# Patient Record
Sex: Male | Born: 1945 | Race: White | Hispanic: No | Marital: Married | Smoking: Former smoker
Health system: Southern US, Community
[De-identification: ages and names within clinical notes are randomized; demographics above are authoritative.]

## PROBLEM LIST (undated history)

## (undated) DIAGNOSIS — N210 Calculus in bladder: Secondary | ICD-10-CM

## (undated) DIAGNOSIS — Z860100 Personal history of colon polyps, unspecified: Secondary | ICD-10-CM

## (undated) DIAGNOSIS — F32A Depression, unspecified: Secondary | ICD-10-CM

## (undated) DIAGNOSIS — Z8719 Personal history of other diseases of the digestive system: Secondary | ICD-10-CM

## (undated) DIAGNOSIS — Z9889 Other specified postprocedural states: Secondary | ICD-10-CM

## (undated) DIAGNOSIS — K5730 Diverticulosis of large intestine without perforation or abscess without bleeding: Secondary | ICD-10-CM

## (undated) DIAGNOSIS — I10 Essential (primary) hypertension: Secondary | ICD-10-CM

## (undated) DIAGNOSIS — K2970 Gastritis, unspecified, without bleeding: Secondary | ICD-10-CM

## (undated) DIAGNOSIS — M1990 Unspecified osteoarthritis, unspecified site: Secondary | ICD-10-CM

## (undated) DIAGNOSIS — K219 Gastro-esophageal reflux disease without esophagitis: Secondary | ICD-10-CM

## (undated) DIAGNOSIS — F419 Anxiety disorder, unspecified: Secondary | ICD-10-CM

## (undated) DIAGNOSIS — Z973 Presence of spectacles and contact lenses: Secondary | ICD-10-CM

## (undated) DIAGNOSIS — G2581 Restless legs syndrome: Secondary | ICD-10-CM

## (undated) DIAGNOSIS — M810 Age-related osteoporosis without current pathological fracture: Secondary | ICD-10-CM

## (undated) DIAGNOSIS — F329 Major depressive disorder, single episode, unspecified: Secondary | ICD-10-CM

## (undated) DIAGNOSIS — D509 Iron deficiency anemia, unspecified: Secondary | ICD-10-CM

## (undated) DIAGNOSIS — B9681 Helicobacter pylori [H. pylori] as the cause of diseases classified elsewhere: Secondary | ICD-10-CM

## (undated) DIAGNOSIS — N201 Calculus of ureter: Secondary | ICD-10-CM

## (undated) HISTORY — DX: Major depressive disorder, single episode, unspecified: F32.9

## (undated) HISTORY — DX: Iron deficiency anemia, unspecified: D50.9

## (undated) HISTORY — PX: EXTRACORPOREAL SHOCK WAVE LITHOTRIPSY: SHX1557

## (undated) HISTORY — DX: Essential (primary) hypertension: I10

## (undated) HISTORY — DX: Depression, unspecified: F32.A

---

## 1993-09-27 HISTORY — PX: OTHER SURGICAL HISTORY: SHX169

## 1998-09-27 HISTORY — PX: SHOULDER ARTHROSCOPY W/ SUBACROMIAL DECOMPRESSION AND DISTAL CLAVICLE EXCISION: SHX2401

## 2010-12-07 ENCOUNTER — Ambulatory Visit (INDEPENDENT_AMBULATORY_CARE_PROVIDER_SITE_OTHER): Payer: Medicare Other | Admitting: Internal Medicine

## 2010-12-07 DIAGNOSIS — R1310 Dysphagia, unspecified: Secondary | ICD-10-CM

## 2010-12-08 ENCOUNTER — Emergency Department (HOSPITAL_COMMUNITY)
Admission: EM | Admit: 2010-12-08 | Discharge: 2010-12-08 | Disposition: A | Discharge status: Home / Self Care | Payer: Medicare Other | Attending: Emergency Medicine | Admitting: Emergency Medicine

## 2010-12-08 DIAGNOSIS — K219 Gastro-esophageal reflux disease without esophagitis: Secondary | ICD-10-CM | POA: Insufficient documentation

## 2010-12-08 DIAGNOSIS — R000 Tachycardia, unspecified: Secondary | ICD-10-CM | POA: Insufficient documentation

## 2010-12-08 DIAGNOSIS — F411 Generalized anxiety disorder: Secondary | ICD-10-CM | POA: Insufficient documentation

## 2010-12-08 DIAGNOSIS — R002 Palpitations: Secondary | ICD-10-CM | POA: Insufficient documentation

## 2010-12-08 DIAGNOSIS — I10 Essential (primary) hypertension: Secondary | ICD-10-CM | POA: Insufficient documentation

## 2010-12-08 LAB — DIFFERENTIAL
Basophils Absolute: 0 10*3/uL (ref 0.0–0.1)
Eosinophils Relative: 2 % (ref 0–5)
Lymphocytes Relative: 24 % (ref 12–46)
Lymphs Abs: 2 10*3/uL (ref 0.7–4.0)
Monocytes Absolute: 0.9 10*3/uL (ref 0.1–1.0)
Neutro Abs: 5.1 10*3/uL (ref 1.7–7.7)

## 2010-12-08 LAB — CBC
HCT: 35.9 % — ABNORMAL LOW (ref 39.0–52.0)
Hemoglobin: 13.2 g/dL (ref 13.0–17.0)
MCHC: 36.8 g/dL — ABNORMAL HIGH (ref 30.0–36.0)
MCV: 85.1 fL (ref 78.0–100.0)
WBC: 8.2 10*3/uL (ref 4.0–10.5)

## 2010-12-08 LAB — COMPREHENSIVE METABOLIC PANEL
ALT: 23 U/L (ref 0–53)
AST: 28 U/L (ref 0–37)
Alkaline Phosphatase: 46 U/L (ref 39–117)
Calcium: 10.9 mg/dL — ABNORMAL HIGH (ref 8.4–10.5)
GFR calc Af Amer: >60 mL/min (ref >60)
Potassium: 3.4 mEq/L — ABNORMAL LOW (ref 3.5–5.1)
Sodium: 127 mEq/L — ABNORMAL LOW (ref 135–145)
Total Protein: 7.2 g/dL (ref 6.0–8.3)

## 2010-12-08 LAB — POCT CARDIAC MARKERS
CKMB, poc: 4.2 ng/mL (ref 1.0–8.0)
Myoglobin, poc: 247 ng/mL (ref 12–200)

## 2010-12-14 ENCOUNTER — Encounter (INDEPENDENT_AMBULATORY_CARE_PROVIDER_SITE_OTHER): Payer: Medicare Other | Admitting: Internal Medicine

## 2010-12-14 ENCOUNTER — Ambulatory Visit (HOSPITAL_COMMUNITY): Admission: RE | Admit: 2010-12-14 | Payer: Medicare Other | Source: Ambulatory Visit | Admitting: Internal Medicine

## 2010-12-29 NOTE — Consult Note (Addendum)
NAMEKENNITH, MORSS NO.:  [id number]  MEDICAL RECORD NO.:  [id number]           PATIENT TYPE:  E  LOCATION:  [facility]                          FACILITY:  [facility]  PHYSICIAN:  Lionel December, M.D.    DATE OF BIRTH:  1945/11/10  DATE OF CONSULTATION:  12/07/2010 DATE OF DISCHARGE:                                CONSULTATION   REASON FOR CONSULTATION:  Dysphagia.  This is an office visit.  HISTORY OF PRESENT ILLNESS:  Cameron Atkins is a 65 year old white male presenting today with complaints of solid food dysphagia.  He says foods are lodging in his mid esophagus.  Cameron Atkins will lodge.  He also states the corn bread is lodging.  He has had symptoms for about a year.  This dysphagia occurs once a month or more.  He says his appetite is terrible but has had no weight loss.  He denies any acid reflux.  He states at times it feels like he is having a spasm in his esophagus when he eats. He denies any melena or rectal bleeding.  He usually has a bowel movement a day and they are soft.  There is no change in his bowel habits.  ALLERGIES:  He is allergic to ANTIDEPRESSANTS, no surgeries.  MEDICAL HISTORY:  Hypertension "bad nerves" and depression.  FAMILY HISTORY:  His mother is alive in good health.  His father deceased from leukemia.  He has one sister in good health.  He is married.  He is a Musician.  He does not smoke.  He drinks about 2-3 beers daily.  He has 3 children in good health.  MEDICATIONS:  Home medications include cimetidine, one-half of a 400-mg tab as needed, lorazepam up to 4 a day as needed, hydrochlorothiazide 25 mg daily, lisinopril 10 mg a day and gabapentin 600 mg 2-3 tablets a day.  OBJECTIVE:  VITAL SIGNS:  His weight is 158, his height is 5 feet 7 inches, his temperature is 97.5.  His blood pressure is 148/70 and his pulse is 76. HEENT:  He has natural teeth.  His oral mucosa is moist.  There are no lesions.  His conjunctivae are pink.  His  sclerae anicteric. NECK:  His thyroid is normal.  There is no cervical lymphadenopathy. LUNGS:  Clear. HEART:  Regular rate and rhythm. ABDOMEN:  Soft.  Bowel sounds are positive.  No masses. EXTREMITIES:  There is no edema to extremities.  ASSESSMENT:  Cameron Atkins is a 65 year old white male presenting with solid food dysphagia.  An esophageal stricture needs to be ruled out.   Will also schedule a colonoscopy.  His last colonoscopy in 2009 was incomplete.  RECOMMENDATIONS:  We will schedule an EGD with Dr. Karilyn Cota in the near future.  I will also start him on omeprazole 20 mg a day.  He is to stop the cimetidine.  The risk and benefits were reviewed with the patient, and he is agreeable.    ______________________________ Dorene Ar, NP   ______________________________ Lionel December, M.D.    TS/MEDQ  D:  12/07/2010  T:  12/08/2010  Job:  [id number]  Electronically  Signed by Dorene Ar PA on 12/11/2010 10:03:11 AM Electronically Signed by Lionel December M.D. on 12/29/2010 09:41:23 AM

## 2010-12-29 NOTE — Consult Note (Signed)
  NAMEBRADIN, Cameron Atkins NO.:  [id number]  MEDICAL RECORD NO.:  [id number]           PATIENT TYPE:  LOCATION:                                FACILITY:  [facility]  PHYSICIAN:  Lionel December, M.D.    DATE OF BIRTH:  June 20, 1946  DATE OF CONSULTATION: DATE OF DISCHARGE:                                CONSULTATION   ADDENDUM  Cameron Atkins did undergo an EGD on September 13, 2008, for esophagitis and gastritis, which showed esophagitis and gastritis.  It was negative for dysplasia or neoplasia.  Cameron Atkins also underwent a colonoscopy in 2009, for screening which revealed a colon polyp with a hyperplastic polyp. The colonoscopy was incomplete and he was advised to follow up with another colonoscopy in 2010, and I will call him today and we will schedule a colonoscopy with his EGD ED.    ______________________________ Dorene Ar, NP   ______________________________ Lionel December, M.D.    TS/MEDQ  D:  12/07/2010  T:  12/08/2010  Job:  [id number]  Electronically Signed by Dorene Ar PA on 12/18/2010 11:52:48 AM Electronically Signed by Lionel December M.D. on 12/29/2010 09:41:47 AM

## 2011-02-10 ENCOUNTER — Ambulatory Visit (HOSPITAL_COMMUNITY)
Admission: RE | Admit: 2011-02-10 | Discharge: 2011-02-10 | Disposition: A | Discharge status: Home / Self Care | Payer: Medicare Other | Source: Ambulatory Visit | Attending: Internal Medicine | Admitting: Internal Medicine

## 2011-02-10 ENCOUNTER — Encounter (HOSPITAL_BASED_OUTPATIENT_CLINIC_OR_DEPARTMENT_OTHER): Payer: Medicare Other | Admitting: Internal Medicine

## 2011-02-10 ENCOUNTER — Other Ambulatory Visit (INDEPENDENT_AMBULATORY_CARE_PROVIDER_SITE_OTHER): Payer: Self-pay | Admitting: Internal Medicine

## 2011-02-10 DIAGNOSIS — K222 Esophageal obstruction: Secondary | ICD-10-CM

## 2011-02-10 DIAGNOSIS — Z79899 Other long term (current) drug therapy: Secondary | ICD-10-CM | POA: Insufficient documentation

## 2011-02-10 DIAGNOSIS — I10 Essential (primary) hypertension: Secondary | ICD-10-CM | POA: Insufficient documentation

## 2011-02-10 DIAGNOSIS — D126 Benign neoplasm of colon, unspecified: Secondary | ICD-10-CM | POA: Insufficient documentation

## 2011-02-10 DIAGNOSIS — Z860100 Personal history of colon polyps, unspecified: Secondary | ICD-10-CM

## 2011-02-10 DIAGNOSIS — R1310 Dysphagia, unspecified: Secondary | ICD-10-CM

## 2011-02-10 DIAGNOSIS — K5730 Diverticulosis of large intestine without perforation or abscess without bleeding: Secondary | ICD-10-CM | POA: Insufficient documentation

## 2011-02-10 DIAGNOSIS — K219 Gastro-esophageal reflux disease without esophagitis: Secondary | ICD-10-CM

## 2011-02-10 DIAGNOSIS — K449 Diaphragmatic hernia without obstruction or gangrene: Secondary | ICD-10-CM | POA: Insufficient documentation

## 2011-02-28 NOTE — Op Note (Signed)
NAME:  Cameron Atkins, Cameron Atkins NO.:  [id number]  MEDICAL RECORD NO.:  [id number]           PATIENT TYPE:  O  LOCATION:  [facility]                          FACILITY:  [facility]  PHYSICIAN:  Lionel December, M.D.    DATE OF BIRTH:  1945-11-24  DATE OF PROCEDURE: DATE OF DISCHARGE:                              OPERATIVE REPORT   PROCEDURE:  Esophagogastroduodenoscopy with esophageal dilation followed by colonoscopy.  INDICATION:  Cameron Atkins is 65 year old Caucasian male who has chronic GERD and believes his heartburn is well-controlled with cimetidine, however, he has been experiencing intermittent solid food dysphagia over a period of several months.  He is undergoing diagnostic/therapeutic EGD.  He also has history of colonic polyps.  His last exam was in [facility] 3 years ago and he was told his prep was suboptimal then he was advised to have a repeat exam in 3 years rather than 5 years.  He is here to have these studies done.  Procedure risks were reviewed with the patient. Informed consent was obtained.  MEDS FOR CONSCIOUS SEDATION:  Cetacaine spray for pharyngeal topical anesthesia, Demerol 50 mg IV, Versed 12 mg IV in divided dose.  FINDINGS:  Procedure performed in endoscopy suite.  The patient's vital signs and O2 sat were monitored during the procedure and remained stable.  PROCEDURES: 1. Esophagogastroduodenoscopy.  The patient was placed in left lateral     position and Pentax videoscope was passed through oropharynx     without any difficulty into esophagus. 2. Esophagus.  Mucosa of the esophagus normal except distally there     was a soft stricture at GE junction with an erosion and mucosal     edema and erythema.  GE junction was located at 36 cm and hiatus     was at 38. 3. Stomach.  It was empty and distended very well insufflation.  Folds     of proximal stomach are normal.  Examination of mucosa at body,     antrum, pyloric channel, as well as angularis,  fundus, and cardia     was normal. 4. Duodenum.  Bulbar mucosa was normal.  Scope was passed into second     part of the duodenum where mucosa and folds were normal.  Endoscope was pulled back to the stomach.  Balloon dilator was passed through the scope.  Guidewire was pushed in the gastric lumen.  Balloon dilator was positioned across the stricture and dilated initially to 15 mm.  This resulted in mucosal disruption at 2 o'clock and 6 o'clock. Stricture was further dilated to 16.5 and finally to 18 mm.  Pictures taken post dilation.  Balloon was withdrawn and endoscope was also withdrawn.  The patient was then prepared for procedure #2.  COLONOSCOPY:  Rectal examination performed.  No abnormality noted on external or digital exam.  Pentax videoscope was placed in the rectum and advanced under vision into sigmoid colon and beyond.  A moderate number of diverticula at sigmoid colon and few more at descending colon. Preparation was satisfactory.  Scope was passed into cecum which was identified by appendiceal orifice and ileocecal valve.  There was a 3-mm polyp on a fold at ascending colon which was ablated via cold biopsy. Mucosa of rest of the colon was normal.  Rectal mucosa similarly was normal.  Scope was retroflexed to examine anorectal junction which was unremarkable.  Endoscope was withdrawn.  Withdrawal time was 12 minutes. The patient tolerated the procedures well.  FINAL DIAGNOSES: 1. 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  The     stricture was dilated to 18 mm with a balloon. 2. A 2-cm size sliding hiatal hernia. 3. Normal exam. of the stomach, first and second part of the duodenum. 4. Moderate left-sided diverticulosis. 5. A 3-mm polyp ablated via cold biopsy from the ascending colon.  RECOMMENDATIONS: 1. Antireflux measures are reinforced.  Discontinue cimetidine, start     pantoprazole 40 mg daily 30 minutes before breakfast.  Prescription      written for 1 month with refills up to a year. 2. High-fiber diet. 3. I will be contacting patient with results of biopsy and further     recommendations.          ______________________________ Lionel December, M.D.     NR/MEDQ  D:  02/10/2011  T:  02/10/2011  Job:  [id number]  cc:   Dr. Clenton Pare, [facility]  Electronically Signed by Lionel December M.D. on 02/28/2011 01:08:02 PM

## 2011-05-25 ENCOUNTER — Encounter (INDEPENDENT_AMBULATORY_CARE_PROVIDER_SITE_OTHER): Payer: Self-pay | Admitting: *Deleted

## 2011-07-20 ENCOUNTER — Ambulatory Visit (INDEPENDENT_AMBULATORY_CARE_PROVIDER_SITE_OTHER): Payer: Medicare Other | Admitting: Internal Medicine

## 2011-07-20 ENCOUNTER — Encounter (INDEPENDENT_AMBULATORY_CARE_PROVIDER_SITE_OTHER): Payer: Self-pay | Admitting: Internal Medicine

## 2011-07-20 VITALS — BP 90/52 | HR 72 | Temp 98.6°F | Ht 66.0 in | Wt 148.1 lb

## 2011-07-20 DIAGNOSIS — R1310 Dysphagia, unspecified: Secondary | ICD-10-CM

## 2011-07-20 DIAGNOSIS — R634 Abnormal weight loss: Secondary | ICD-10-CM

## 2011-07-20 DIAGNOSIS — K219 Gastro-esophageal reflux disease without esophagitis: Secondary | ICD-10-CM

## 2011-07-20 NOTE — Patient Instructions (Signed)
Take Pantoprazole 30 minutes before breakfast daily by mouth. Can take Cimetidine upto 800 mg in the evening for breakthrough heartburn as needed . Weight check  In 8 weeks.

## 2011-07-25 NOTE — Progress Notes (Signed)
Resenting complaint; followup for dysphagia and weight loss.  Subjective; patient is 65 year old Caucasian male who was initially evaluated in March 2012 for dysphagia and history of colonic polyps. He underwent EGD and colonoscopy in May this year. He had stricture at GE junction with mild changes of reflux esophagitis. stricture was dilated with a balloon to 18 mm. colonoscopy revealed left-sided diverticulosis and a small polyp at ascending colon which was an adenoma. Patient was begun on pantoprazole and he was advised to discontinue cimetidine. He now returns for follow visit stating that his swallowing is normal. He rarely experiences heartburn. However he has lost 10 pounds since his last visit 7 months ago. He says his appetite is not good he generally eats one meal a day. He has continued cimetidine because he was given the impression that it prolongs half-life of his nerve medication. He tells me he's been on the same dose of lorazepam for many years. He believes his stress disorder is well controlled. He denies fever chills or night sweats. He has noted some change in consistency to his stools which are not soft he denies melena or rectal bleeding. Current medications Current Outpatient Prescriptions  Medication Sig Dispense Refill  . cimetidine (TAGAMET) 800 MG tablet Take 400 mg by mouth 3 (three) times daily.       . fish oil-omega-3 fatty acids 1000 MG capsule Take 2 g by mouth daily.        Marland Kitchen gabapentin (NEURONTIN) 600 MG tablet Take 600 mg by mouth 3 (three) times daily.        . hydrochlorothiazide (HYDRODIURIL) 25 MG tablet Take 25 mg by mouth daily.        Marland Kitchen lisinopril (PRINIVIL,ZESTRIL) 10 MG tablet Take 10 mg by mouth daily.        Marland Kitchen LORazepam (ATIVAN) 1 MG tablet Take 1 mg by mouth 4 (four) times daily - after meals and at bedtime.        . pantoprazole (PROTONIX) 40 MG tablet Take 40 mg by mouth daily.         Objective BP 90/52  Pulse 72  Temp 98.6 F (37 C)  Ht 5\' 6"   (1.676 m)  Wt 148 lb 1.6 oz (67.178 kg)  BMI 23.90 kg/m2 Conjunctiva is pink. Sclerae nonicteric. Oropharyngeal mucosa is normal. No neck masses or thyromegaly noted. His abdomen is soft and nontender without organomegaly or masses. No LE edema or clubbing noted. Assessment #1.  Dysphagia secondary to distal esophageal stricture status post dilation 5 months ago; currently without any dysphagia. #2. Chronic GERD. While he is on PPI he does not need cimetidine except for breakthrough symptoms. #3. Weight loss. He has lost 10 pounds in last 7 months. His weight loss would appear to be do to poor eating habits in may even be a manifestation of stress disorder or depression. #4. History of colonic polyps. Single small adenoma removed and colonoscopy of May 2012. His next exam would be in 5 years. Plan Patient advised to take pantoprazole 30 minutes before breakfast daily He can take cimetidine up to 800 mg in the evening for breakthrough symptoms on as needed basis. He will stop by for weight check in 8 weeks. If weight loss continues will need further evaluation.

## 2012-03-01 ENCOUNTER — Telehealth (INDEPENDENT_AMBULATORY_CARE_PROVIDER_SITE_OTHER): Payer: Self-pay | Admitting: *Deleted

## 2012-03-01 MED ORDER — PANTOPRAZOLE SODIUM 40 MG PO TBEC: 40.0000 mg | DELAYED_RELEASE_TABLET | Freq: Every day | ORAL | Status: DC

## 2012-03-01 NOTE — Telephone Encounter (Signed)
[facility] has requested a refill on Pantoprazole 40 mg, take 1 tablet by mouth every morning.

## 2012-04-23 ENCOUNTER — Encounter (HOSPITAL_COMMUNITY): Payer: Self-pay | Admitting: Emergency Medicine

## 2012-04-23 ENCOUNTER — Emergency Department (HOSPITAL_COMMUNITY)
Admission: EM | Admit: 2012-04-23 | Discharge: 2012-04-23 | Disposition: A | Discharge status: Home / Self Care | Payer: Medicare Other | Attending: Emergency Medicine | Admitting: Emergency Medicine

## 2012-04-23 DIAGNOSIS — I10 Essential (primary) hypertension: Secondary | ICD-10-CM | POA: Insufficient documentation

## 2012-04-23 DIAGNOSIS — T7840XA Allergy, unspecified, initial encounter: Secondary | ICD-10-CM | POA: Insufficient documentation

## 2012-04-23 DIAGNOSIS — K219 Gastro-esophageal reflux disease without esophagitis: Secondary | ICD-10-CM | POA: Insufficient documentation

## 2012-04-23 DIAGNOSIS — F411 Generalized anxiety disorder: Secondary | ICD-10-CM | POA: Insufficient documentation

## 2012-04-23 MED ORDER — FAMOTIDINE 20 MG PO TABS: 20.0000 mg | ORAL_TABLET | Freq: Two times a day (BID) | ORAL | Status: DC

## 2012-04-23 MED ORDER — HYDROXYZINE HCL 25 MG PO TABS: ORAL_TABLET | ORAL | Status: DC

## 2012-04-23 MED ORDER — PREDNISONE 20 MG PO TABS: ORAL_TABLET | ORAL | Status: DC

## 2012-04-23 MED ORDER — LORAZEPAM 1 MG PO TABS
1.0000 mg | ORAL_TABLET | Freq: Once | ORAL | Status: AC
  Administered 2012-04-23: 1 mg via ORAL
  Filled 2012-04-23: qty 1

## 2012-04-23 MED ORDER — DIPHENHYDRAMINE HCL 50 MG/ML IJ SOLN
25.0000 mg | Freq: Once | INTRAMUSCULAR | Status: AC
  Administered 2012-04-23: 25 mg via INTRAVENOUS
  Filled 2012-04-23: qty 1

## 2012-04-23 MED ORDER — METHYLPREDNISOLONE SODIUM SUCC 125 MG IJ SOLR
125.0000 mg | Freq: Once | INTRAMUSCULAR | Status: AC
  Administered 2012-04-23: 125 mg via INTRAVENOUS
  Filled 2012-04-23: qty 2

## 2012-04-23 MED ORDER — FAMOTIDINE IN NACL 20-0.9 MG/50ML-% IV SOLN
20.0000 mg | Freq: Once | INTRAVENOUS | Status: AC
  Administered 2012-04-23: 20 mg via INTRAVENOUS
  Filled 2012-04-23: qty 50

## 2012-04-23 NOTE — ED Notes (Signed)
Patient states he think something bit him. Hives notes to lower back and buttocks area. Also complaining of itching all over. States he has had intermittent chest tightness and shortness of breath.

## 2012-04-23 NOTE — ED Notes (Signed)
Pt complains of extreme anxiety and requests to take his Ativan from home.  I informed him he could not take his home medications however I would check with Dr. Lynelle Doctor.  Dr.  Lynelle Doctor notified of the above and a new order was received and carried out.  Nursing staff to continue to monitor.

## 2012-04-23 NOTE — ED Provider Notes (Signed)
History     CSN: [id number]  Arrival date & time 04/23/12  0037   First MD Initiated Contact with Patient 04/23/12 0118      Chief Complaint  Patient presents with  . Allergic Reaction  . Urticaria    (Consider location/radiation/quality/duration/timing/severity/associated sxs/prior treatment) HPI Patient relates this evening about 11 PM he was getting ready to go to bed and he started having itching in his hands and then he started having itching in his feet and then he started having itching around his waistband and on his trunk. He states he then started feeling a little short of breath. He states he felt some chest tightness and felt short of breath and nervous. He states he has a history of anxiety and had taken Ativan earlier in the evening for anxiety. He states his chin felt swollen and he felt like he had swelling in his throat but indicates more down below the oropharynx closer to the lungs. He states he had something similar about a year ago with unknown cause. He denies anything else new including any new medications or foods today. He states he took a Benadryl 25 mg prior to coming to the emergency department. He relates however Benadryl makes him feel "wired" rather than sleepy.  PCP Dr. Loney Hering GI Dr. Renae Fickle   Past Medical History  Diagnosis Date  . Hypertension     for 5 yrs  . Depression   anxiety GERD  Past Surgical History  Procedure Date  . Lithotripsy     No family history on file.  History  Substance Use Topics  . Smoking status: Never Smoker   . Smokeless tobacco: Not on file   Comment: chew occasionally  . Alcohol Use: Yes     3 beers a day   Lives with spouse    Review of Systems  All other systems reviewed and are negative.    Allergies  Mepergan  Home Medications   Current Outpatient Rx  Name Route Sig Dispense Refill  . CIMETIDINE 800 MG PO TABS Oral Take 400 mg by mouth 3 (three) times daily.     . OMEGA-3 FATTY ACIDS 1000 MG  PO CAPS Oral Take 2 g by mouth daily.      Marland Kitchen GABAPENTIN 600 MG PO TABS Oral Take 600 mg by mouth 3 (three) times daily.      Marland Kitchen HYDROCHLOROTHIAZIDE 25 MG PO TABS Oral Take 25 mg by mouth daily.      Marland Kitchen LISINOPRIL 10 MG PO TABS Oral Take 10 mg by mouth daily.      Marland Kitchen LORAZEPAM 1 MG PO TABS Oral Take 1 mg by mouth 4 (four) times daily - after meals and at bedtime.      Marland Kitchen PANTOPRAZOLE SODIUM 40 MG PO TBEC Oral Take 1 tablet (40 mg total) by mouth daily. 30 tablet 5    BP 145/80  Pulse 95  Temp 98.1 F (36.7 C) (Oral)  Resp 20  Ht 5\' 6"  (1.676 m)  Wt 190 lb (86.183 kg)  BMI 30.67 kg/m2  SpO2 94%  Vital signs normal    Physical Exam  Nursing note and vitals reviewed. Constitutional: He is oriented to person, place, and time. He appears well-developed and well-nourished.  Non-toxic appearance. He does not appear ill. No distress.  HENT:  Head: Normocephalic and atraumatic.  Right Ear: External ear normal.  Left Ear: External ear normal.  Nose: Nose normal. No mucosal edema or rhinorrhea.  Mouth/Throat: Oropharynx is clear  and moist and mucous membranes are normal. No dental abscesses or uvula swelling.  Eyes: Conjunctivae and EOM are normal. Pupils are equal, round, and reactive to light.  Neck: Normal range of motion and full passive range of motion without pain. Neck supple.  Cardiovascular: Normal rate, regular rhythm and normal heart sounds.  Exam reveals no gallop and no friction rub.   No murmur heard. Pulmonary/Chest: Effort normal and breath sounds normal. No respiratory distress. He has no wheezes. He has no rhonchi. He has no rales. He exhibits no tenderness and no crepitus.  Abdominal: Soft. Normal appearance and bowel sounds are normal. He exhibits no distension. There is no tenderness. There is no rebound and no guarding.  Musculoskeletal: Normal range of motion. He exhibits no edema and no tenderness.       Moves all extremities well.   Neurological: He is alert and  oriented to person, place, and time. He has normal strength. No cranial nerve deficit.  Skin: Skin is warm, dry and intact. Rash noted. There is erythema. No pallor.       Patient is noted to have diffuse redness over his hands, knees, with small urticarial type lesions coalescing on his thighs, abdomen and back. There is no obvious angioedema on his face or in his throat.  Psychiatric: He has a normal mood and affect. His speech is normal and behavior is normal. His mood appears not anxious.    ED Course  Procedures (including critical care time)    Medications  methylPREDNISolone sodium succinate (SOLU-MEDROL) 125 mg/2 mL injection 125 mg (125 mg Intravenous Given 04/23/12 0139)  diphenhydrAMINE (BENADRYL) injection 25 mg (25 mg Intravenous Given 04/23/12 0139)  famotidine (PEPCID) IVPB 20 mg (0 mg Intravenous Stopped 04/23/12 0230)  LORazepam (ATIVAN) tablet 1 mg (1 mg Oral Given 04/23/12 0240)    Patient got anxious after the Benadryl and was given Ativan  Recheck 03:30 rash and redness is gone. Feels ready to go home. Have discussed possibility this is from his lisinopril, although he didn't have angioedema.   1. Allergic reaction     New Prescriptions   FAMOTIDINE (PEPCID) 20 MG TABLET    Take 1 tablet (20 mg total) by mouth 2 (two) times daily.   HYDROXYZINE (ATARAX/VISTARIL) 25 MG TABLET    Take 1 or 2 po Q 6hrs for rash or itching   PREDNISONE (DELTASONE) 20 MG TABLET    Take 3 po QD x 2d starting tomorrow, then 2 po QD x 3d then 1 po QD x 3d    Plan discharge  Devoria Albe, MD, [facility]   MDM          Ward Givens, MD 04/23/12 [phone]

## 2012-05-08 ENCOUNTER — Other Ambulatory Visit: Payer: Self-pay | Admitting: Dermatology

## 2012-07-05 ENCOUNTER — Encounter (INDEPENDENT_AMBULATORY_CARE_PROVIDER_SITE_OTHER): Payer: Self-pay | Admitting: *Deleted

## 2012-07-13 ENCOUNTER — Ambulatory Visit (INDEPENDENT_AMBULATORY_CARE_PROVIDER_SITE_OTHER): Payer: Medicare Other | Admitting: Internal Medicine

## 2013-03-20 ENCOUNTER — Encounter (HOSPITAL_COMMUNITY): Payer: Self-pay | Admitting: *Deleted

## 2013-03-20 ENCOUNTER — Emergency Department (HOSPITAL_COMMUNITY)
Admission: EM | Admit: 2013-03-20 | Discharge: 2013-03-21 | Disposition: A | Discharge status: Home / Self Care | Payer: Medicare Other | Attending: Emergency Medicine | Admitting: Emergency Medicine

## 2013-03-20 DIAGNOSIS — Y9389 Activity, other specified: Secondary | ICD-10-CM | POA: Insufficient documentation

## 2013-03-20 DIAGNOSIS — T189XXA Foreign body of alimentary tract, part unspecified, initial encounter: Secondary | ICD-10-CM | POA: Insufficient documentation

## 2013-03-20 DIAGNOSIS — I10 Essential (primary) hypertension: Secondary | ICD-10-CM | POA: Insufficient documentation

## 2013-03-20 DIAGNOSIS — Z87448 Personal history of other diseases of urinary system: Secondary | ICD-10-CM | POA: Insufficient documentation

## 2013-03-20 DIAGNOSIS — IMO0002 Reserved for concepts with insufficient information to code with codable children: Secondary | ICD-10-CM | POA: Insufficient documentation

## 2013-03-20 DIAGNOSIS — F329 Major depressive disorder, single episode, unspecified: Secondary | ICD-10-CM | POA: Insufficient documentation

## 2013-03-20 DIAGNOSIS — F411 Generalized anxiety disorder: Secondary | ICD-10-CM | POA: Insufficient documentation

## 2013-03-20 DIAGNOSIS — Y929 Unspecified place or not applicable: Secondary | ICD-10-CM | POA: Insufficient documentation

## 2013-03-20 DIAGNOSIS — F3289 Other specified depressive episodes: Secondary | ICD-10-CM | POA: Insufficient documentation

## 2013-03-20 DIAGNOSIS — Z79899 Other long term (current) drug therapy: Secondary | ICD-10-CM | POA: Insufficient documentation

## 2013-03-20 HISTORY — DX: Anxiety disorder, unspecified: F41.9

## 2013-03-20 HISTORY — DX: Gastro-esophageal reflux disease without esophagitis: K21.9

## 2013-03-20 NOTE — ED Notes (Signed)
Pt reporting esophageal spasm feeling with some difficulty swallowing.  Reports some nausea.  No SOB. Pt reports history of same.

## 2013-03-21 ENCOUNTER — Telehealth: Payer: Self-pay | Admitting: Gastroenterology

## 2013-03-21 ENCOUNTER — Other Ambulatory Visit: Payer: Self-pay | Admitting: Gastroenterology

## 2013-03-21 ENCOUNTER — Encounter (HOSPITAL_COMMUNITY)
Admission: EM | Disposition: A | Discharge status: Home / Self Care | Payer: Self-pay | Source: Home / Self Care | Attending: Emergency Medicine

## 2013-03-21 DIAGNOSIS — K222 Esophageal obstruction: Secondary | ICD-10-CM

## 2013-03-21 DIAGNOSIS — K2990 Gastroduodenitis, unspecified, without bleeding: Secondary | ICD-10-CM

## 2013-03-21 DIAGNOSIS — K2970 Gastritis, unspecified, without bleeding: Secondary | ICD-10-CM

## 2013-03-21 DIAGNOSIS — T18108A Unspecified foreign body in esophagus causing other injury, initial encounter: Secondary | ICD-10-CM

## 2013-03-21 HISTORY — PX: ESOPHAGEAL DILATION: SHX303

## 2013-03-21 HISTORY — PX: ESOPHAGOGASTRODUODENOSCOPY: SHX5428

## 2013-03-21 SURGERY — EGD (ESOPHAGOGASTRODUODENOSCOPY)
Anesthesia: Moderate Sedation

## 2013-03-21 MED ORDER — MIDAZOLAM HCL 5 MG/5ML IJ SOLN
INTRAMUSCULAR | Status: DC | PRN
  Administered 2013-03-21: 2 mg via INTRAVENOUS
  Administered 2013-03-21 (×2): 1 mg via INTRAVENOUS
  Administered 2013-03-21 (×2): 2 mg via INTRAVENOUS

## 2013-03-21 MED ORDER — FENTANYL CITRATE 0.05 MG/ML IJ SOLN
INTRAMUSCULAR | Status: DC | PRN
  Administered 2013-03-21: 25 ug via INTRAVENOUS
  Administered 2013-03-21: 50 ug via INTRAVENOUS
  Administered 2013-03-21: 25 ug via INTRAVENOUS

## 2013-03-21 MED ORDER — BUTAMBEN-TETRACAINE-BENZOCAINE 2-2-14 % EX AERO
INHALATION_SPRAY | CUTANEOUS | Status: DC | PRN
  Administered 2013-03-21: 2 via TOPICAL

## 2013-03-21 MED ORDER — SODIUM CHLORIDE 0.9 % IV SOLN: Freq: Once | INTRAVENOUS | Status: DC

## 2013-03-21 MED ORDER — STERILE WATER FOR IRRIGATION IR SOLN
Status: DC | PRN
  Administered 2013-03-21: 03:00:00

## 2013-03-21 MED ORDER — GLUCAGON HCL (RDNA) 1 MG IJ SOLR
1.0000 mg | Freq: Once | INTRAMUSCULAR | Status: AC
  Administered 2013-03-21: 1 mg via INTRAVENOUS
  Filled 2013-03-21: qty 1

## 2013-03-21 NOTE — ED Provider Notes (Signed)
History    CSN: [id number] Arrival date & time 03/20/13  2334  First MD Initiated Contact with Patient 03/21/13 0127     Chief Complaint  Patient presents with  . Gastrophageal Reflux   (Consider location/radiation/quality/duration/timing/severity/associated sxs/prior Treatment) HPI HPI Comments: Cameron Atkins is a 67 y.o. male who presents to the Emergency Department complaining of inability to swallow since eating roast beef at 8 PM last night. He has tried several times to drink fluids without success.  He has had esophageal stretching done in the past by Dr. Karilyn Atkins.  PCP Dr. Loney Atkins GI Dr. Karilyn Atkins Past Medical History  Diagnosis Date  . Hypertension     for 5 yrs  . Depression   . GERD (gastroesophageal reflux disease)   . Anxiety   . Renal disorder    Past Surgical History  Procedure Laterality Date  . Lithotripsy    . Joint replacement     History reviewed. No pertinent family history. History  Substance Use Topics  . Smoking status: Never Smoker   . Smokeless tobacco: Not on file     Comment: chew occasionally  . Alcohol Use: Yes     Comment: 3 beers a day    Review of Systems  Constitutional: Negative for fever.       10 Systems reviewed and are negative for acute change except as noted in the HPI.  HENT: Negative for congestion.   Eyes: Negative for discharge and redness.  Respiratory: Negative for cough and shortness of breath.   Cardiovascular: Negative for chest pain.  Gastrointestinal: Negative for vomiting and abdominal pain.       Unable to swallow  Musculoskeletal: Negative for back pain.  Skin: Negative for rash.  Neurological: Negative for syncope, numbness and headaches.  Psychiatric/Behavioral:       No behavior change.    Allergies  Mepergan  Home Medications   Current Outpatient Rx  Name  Route  Sig  Dispense  Refill  . cimetidine (TAGAMET) 800 MG tablet   Oral   Take 400 mg by mouth 3 (three) times daily.          .  famotidine (PEPCID) 20 MG tablet   Oral   Take 1 tablet (20 mg total) by mouth 2 (two) times daily.   20 tablet   0   . fish oil-omega-3 fatty acids 1000 MG capsule   Oral   Take 2 g by mouth daily.           Marland Kitchen gabapentin (NEURONTIN) 600 MG tablet   Oral   Take 600 mg by mouth 3 (three) times daily.           . hydrochlorothiazide (HYDRODIURIL) 25 MG tablet   Oral   Take 25 mg by mouth daily.           . hydrOXYzine (ATARAX/VISTARIL) 25 MG tablet      Take 1 or 2 po Q 6hrs for rash or itching   60 tablet   0   . lisinopril (PRINIVIL,ZESTRIL) 10 MG tablet   Oral   Take 10 mg by mouth daily.           Marland Kitchen LORazepam (ATIVAN) 1 MG tablet   Oral   Take 1 mg by mouth 4 (four) times daily - after meals and at bedtime.           . pantoprazole (PROTONIX) 40 MG tablet   Oral   Take 1 tablet (40 mg total)  by mouth daily.   30 tablet   5   . predniSONE (DELTASONE) 20 MG tablet      Take 3 po QD x 2d starting tomorrow, then 2 po QD x 3d then 1 po QD x 3d   15 tablet   0    BP 168/100  Pulse 84  Temp(Src) 97.8 F (36.6 C) (Oral)  Resp 20  Ht 5\' 6"  (1.676 m)  Wt 160 lb (72.576 kg)  BMI 25.84 kg/m2  SpO2 99% Physical Exam  Nursing note and vitals reviewed. Constitutional: He appears well-developed and well-nourished.  Awake, alert, nontoxic appearance.  HENT:  Head: Normocephalic and atraumatic.  Mouth/Throat: Oropharynx is clear and moist.  Eyes: EOM are normal. Pupils are equal, round, and reactive to light.  Neck: Normal range of motion. Neck supple.  Cardiovascular: Normal rate and intact distal pulses.   Pulmonary/Chest: Effort normal and breath sounds normal. He exhibits no tenderness.  Abdominal: Soft. Bowel sounds are normal. There is no tenderness. There is no rebound.  Musculoskeletal: He exhibits no tenderness.  Baseline ROM, no obvious new focal weakness.  Neurological:  Mental status and motor strength appears baseline for patient and  situation.  Skin: No rash noted.  Psychiatric: He has a normal mood and affect.    ED Course  Procedures (including critical care time) 0125 Patient had been given glucogon without success. Tried to take water and immediately brought it up. 0150  T/C to Dr. Darrick Atkins, case discussed, including:  HPI, pertinent PM/SHx, VS/PE, dx testing, ED course and treatment.  Agreeable to coming in to do endo.  MDM  Patient who ate roast beef and now is unable to swallow. Spoke with Dr. Darrick Atkins who will be in to take the patient to endo. Pt stable in ED with no significant deterioration in condition.The patient appears reasonably screened and/or stabilized for discharge and I doubt any other medical condition or other [facility] requiring further screening, evaluation, or treatment in the ED at this time prior to discharge.  MDM Reviewed: nursing note and vitals     Cameron Atkins. Cameron Branch, MD 03/21/13 [phone]

## 2013-03-21 NOTE — Op Note (Signed)
[facility] [address] [place], [address]   ENDOSCOPY PROCEDURE REPORT  PATIENT: Cameron Atkins, Cameron Atkins  MR#: [id number] BIRTHDATE: [birth year] , 67  yrs. old GENDER: Male  ENDOSCOPIST: Jonette Eva, MD REFFERED AO:ZHYQ Loney Hering, M.D.  Lionel December, M.D.  PROCEDURE DATE:  03/21/2013 PROCEDURE:   EGD with foreign body removal, EGD with biopsy, and EGD with dilatation over guidewire  INDICATIONS:1.  ROAST BEEF STUCK IN HIS ESOPHAGUS. MEDICATIONS: Fentanyl 100 mcg IV and Versed 8 mg IV TOPICAL ANESTHETIC: Cetacaine Spray  DESCRIPTION OF PROCEDURE:   After the risks benefits and alternatives of the procedure were thoroughly explained, informed consent was obtained.  The EG-[id number] ([id number])  endoscope was introduced through the mouth and advanced to the second portion of the duodenum. The instrument was slowly withdrawn as the mucosa was carefully examined.  Prior to withdrawal of the scope, the guidwire was placed.  The esophagus was dilated successfully.  The patient was recovered in endoscopy and discharged home in satisfactory condition.   FOOD IMPACTION IN DISTAL ESOPHAGUS.  PARTIALLY REMOVED VIA ROTH NET/TRAPEZOID BASKET.  ESOPHAGEAL OVERTUBE PLACED .  TRAPEZOID BASKET INSERTED.  FOOD BOLUS REDUCED AND SPONTANEOUSLY PROPULSED INTO STOMACH.  ESOPHAGUS: A stricture was found at the gastroesophageal junction. The stenosis was traversable with the endoscope.   STOMACH: Moderate non-erosive gastritis (inflammation) was found in the gastric antrum.  Multiple biopsies were performed using cold forceps. DUODENUM: The duodenal mucosa showed no abnormalities in the bulb and second portion of the duodenum.   Dilation was then performed at the gastroesphageal junction  Dilator: Savary over guidewire Size(s): 11-14 MM Resistance: minimal TO MODERATE Heme: yes  COMPLICATIONS: There were no complications.   ENDOSCOPIC IMPRESSION: 1.   FOOD IMPACTION IN DISTAL ESOPHAGUS  REMOVED 2.   Stricture was found at the gastroesophageal junction 3.   MILD Non-erosive gastritis  RECOMMENDATIONS: CONTINUE PROTONIX.  TAKE 30 MINUTES PRIOR TO YOUR FIRST MEAL. FOLLOW A SOFT MECHANICAL/LOW FAT DIET.  SEE INFO BELOW.  MEATS SHOULD BE CHOPPED OR GROUND ONLY. BIOPSY WILL BE BACK IN 7 DAYS.  RETURN JUL 3 TO COMPLETE STRETCHING YOUR ESOPHAGUS.  FOLLOW UP IN 3 MOS WITH DR.  Karilyn Cota.      _______________________________ eSignedJonette Eva, MD 03/21/2013 4:09 AM      PATIENT NAME:  Cameron Atkins, Cameron Atkins MR#: [id number]

## 2013-03-21 NOTE — H&P (Addendum)
Primary Care Physician:  Ernestine Conrad, MD Primary Gastroenterologist:  Dr. Karilyn Cota  Pre-Procedure History & Physical: HPI:  Cameron Atkins is a 67 y.o. male here for ?FOOD IMPACTION. PMHx: GERD. TAKING P[ROTONIX INTERMITTENTLY DUE TO FEAR OF WORSENING HIS OSTEOPOROSIS. MAY GET CHEST DISCOMFORT WHICH IS RELIVED BY PROTONIX OR ATIVAN. LAST SEEN BY DR. Karilyn Cota IN 2012. INTERMITTENT PROBLEMS WITH SOLIDS. Ate roast beef around 8 pm. PSHx: EGD/DIL 2012(NUR).    Past Medical History  Diagnosis Date  . Hypertension     for 5 yrs  . Depression   . GERD (gastroesophageal reflux disease)   . Anxiety   . Renal disorder     Past Surgical History  Procedure Laterality Date  . Lithotripsy    . Joint replacement      Prior to Admission medications   Medication Sig Start Date End Date Taking? Authorizing Provider  cimetidine (TAGAMET) 800 MG tablet Take 400 mg by mouth 3 (three) times daily.     Historical Provider, MD  famotidine (PEPCID) 20 MG tablet Take 1 tablet (20 mg total) by mouth 2 (two) times daily. 04/23/12 04/23/13  Ward Givens, MD  fish oil-omega-3 fatty acids 1000 MG capsule Take 2 g by mouth daily.      Historical Provider, MD  gabapentin (NEURONTIN) 600 MG tablet Take 600 mg by mouth 3 (three) times daily.      Historical Provider, MD  hydrochlorothiazide (HYDRODIURIL) 25 MG tablet Take 25 mg by mouth daily.      Historical Provider, MD  hydrOXYzine (ATARAX/VISTARIL) 25 MG tablet Take 1 or 2 po Q 6hrs for rash or itching 04/23/12   Ward Givens, MD  lisinopril (PRINIVIL,ZESTRIL) 10 MG tablet Take 10 mg by mouth daily.      Historical Provider, MD  LORazepam (ATIVAN) 1 MG tablet Take 1 mg by mouth 4 (four) times daily - after meals and at bedtime.      Historical Provider, MD  pantoprazole (PROTONIX) 40 MG tablet Take 1 tablet (40 mg total) by mouth daily AS NEEDED 03/01/12   Len Blalock, NP  predniSONE (DELTASONE) 20 MG tablet Take 3 po QD x 2d starting tomorrow, then 2 po QD x 3d then 1  po QD x 3d 04/23/12   Ward Givens, MD    Allergies as of 03/20/2013 - Review Complete 03/20/2013  Allergen Reaction Noted  . Mepergan (meperidine-promethazine)  07/20/2011    History reviewed. No pertinent family history.  History   Social History  . Marital Status: Married    Spouse Name: N/A    Number of Children: N/A  . Years of Education: N/A   Occupational History  . Not on file.   Social History Main Topics  . Smoking status: Never Smoker   . Smokeless tobacco: Not on file     Comment: chew occasionally  . Alcohol Use: Yes     Comment: 3 beers a day  . Drug Use: No  . Sexually Active: Not on file   Other Topics Concern  . Not on file   Social History Narrative  . No narrative on file    Review of Systems: See HPI, otherwise negative ROS   Physical Exam: BP 158/89  Pulse 66  Temp(Src) 97.4 F (36.3 C) (Oral)  Resp 20  Ht 5\' 6"  (1.676 m)  Wt 160 lb (72.576 kg)  BMI 25.84 kg/m2  SpO2 92% General:   Alert,  pleasant and cooperative in NAD Head:  Normocephalic  and atraumatic. Neck:  Supple; Lungs:  Clear throughout to auscultation.    Heart:  Regular rate and rhythm. Abdomen:  Soft, nontender and nondistended. Normal bowel sounds, without guarding, and without rebound.   Neurologic:  Alert and  oriented x4;  grossly normal neurologically.  Impression/Plan:     FOOD IMPACTION/ESOPHAGEAL STRICTURE  PLAN:  EGD/?DIL/REMOVAL OF FOOD IMPACTION TODAY

## 2013-03-21 NOTE — Telephone Encounter (Signed)
Patient is scheduled for July 3rd and I have mailed him the instructions

## 2013-03-21 NOTE — ED Notes (Signed)
Pt transported to Endoscopy for procedure.

## 2013-03-21 NOTE — Telephone Encounter (Signed)
Message copied by Glendora Score on Wed Mar 21, 2013  8:07 AM ------      Message from: Jonette Eva L      Created: Wed Mar 21, 2013  4:01 AM       EGD/DIL JUL 3 DX: ESOPHAGEAL STRICTURE ------

## 2013-03-26 ENCOUNTER — Encounter (HOSPITAL_COMMUNITY): Payer: Self-pay | Admitting: Pharmacy Technician

## 2013-03-27 ENCOUNTER — Encounter (HOSPITAL_COMMUNITY): Payer: Self-pay | Admitting: Gastroenterology

## 2013-03-28 ENCOUNTER — Telehealth (INDEPENDENT_AMBULATORY_CARE_PROVIDER_SITE_OTHER): Payer: Self-pay | Admitting: *Deleted

## 2013-03-28 NOTE — Telephone Encounter (Signed)
Cameron Atkins was seen in the ED on 03/20/13 for having beef stuck in his esophagus. Dr. Darrick Penna stretch his throat some and he is to go back to have the stretching done again. Cameron Atkins would like to know if this procedure could be done/scheduled with Dr. Karilyn Cota. Dr. Karilyn Cota is his GI and he would feel better. His return phone number is [phone] or [phone]. "The other doctor may be great, he just doesn't know her and would like to have his own GI doctor."

## 2013-03-29 ENCOUNTER — Encounter (INDEPENDENT_AMBULATORY_CARE_PROVIDER_SITE_OTHER): Payer: Self-pay | Admitting: *Deleted

## 2013-03-29 ENCOUNTER — Other Ambulatory Visit (INDEPENDENT_AMBULATORY_CARE_PROVIDER_SITE_OTHER): Payer: Self-pay | Admitting: *Deleted

## 2013-03-29 DIAGNOSIS — K222 Esophageal obstruction: Secondary | ICD-10-CM

## 2013-03-29 NOTE — Telephone Encounter (Signed)
EGD/ED sch'd 04/26/13, patient aware -- he is aware that he will need to call Dr Darrick Penna office to cancel the procedure with her

## 2013-03-29 NOTE — Telephone Encounter (Signed)
Per Dr.Rehman on 03/28/13.  The patient will need to call and let Dr.Fields office know that he would like for his GI to do procedure. I called the patient today to make him aware of Dr.Rehman's recommendation. He stated that he would do so and the Dr.Fields office already had him on for 04/09/13 and it was his understanding that the next procedure would have to be done within a month of the last one. Patient was advised that the referral coordinator would know this information and that she would call him with that information.

## 2013-04-05 NOTE — Progress Notes (Signed)
Patient ID: Cameron Atkins, male   DOB: 24-Jul-1946, 67 y.o.   MRN: [id number] Pt called this morning to cancel his EGD for Monday(04/09/13) with [facility]. He is going to let NUR do it because he is his doctor.Left message for Selena Batten

## 2013-04-09 ENCOUNTER — Encounter (HOSPITAL_COMMUNITY): Admission: RE | Payer: Self-pay | Source: Ambulatory Visit

## 2013-04-09 ENCOUNTER — Telehealth (INDEPENDENT_AMBULATORY_CARE_PROVIDER_SITE_OTHER): Payer: Self-pay | Admitting: Internal Medicine

## 2013-04-09 ENCOUNTER — Ambulatory Visit (HOSPITAL_COMMUNITY): Admission: RE | Admit: 2013-04-09 | Payer: Medicare Other | Source: Ambulatory Visit | Admitting: Gastroenterology

## 2013-04-09 DIAGNOSIS — K219 Gastro-esophageal reflux disease without esophagitis: Secondary | ICD-10-CM

## 2013-04-09 SURGERY — ESOPHAGOGASTRODUODENOSCOPY (EGD) WITH ESOPHAGEAL DILATION
Anesthesia: Moderate Sedation

## 2013-04-09 MED ORDER — PANTOPRAZOLE SODIUM 40 MG PO TBEC: 40.0000 mg | DELAYED_RELEASE_TABLET | Freq: Every day | ORAL | Status: DC

## 2013-04-09 NOTE — Telephone Encounter (Signed)
Rx renewed

## 2013-04-16 ENCOUNTER — Encounter (HOSPITAL_COMMUNITY): Payer: Self-pay | Admitting: Pharmacy Technician

## 2013-04-26 ENCOUNTER — Ambulatory Visit (HOSPITAL_COMMUNITY)
Admission: RE | Admit: 2013-04-26 | Discharge: 2013-04-26 | Disposition: A | Discharge status: Home / Self Care | Payer: Medicare Other | Source: Ambulatory Visit | Attending: Internal Medicine | Admitting: Internal Medicine

## 2013-04-26 ENCOUNTER — Encounter (HOSPITAL_COMMUNITY)
Admission: RE | Disposition: A | Discharge status: Home / Self Care | Payer: Self-pay | Source: Ambulatory Visit | Attending: Internal Medicine

## 2013-04-26 ENCOUNTER — Encounter (HOSPITAL_COMMUNITY): Payer: Self-pay | Admitting: *Deleted

## 2013-04-26 DIAGNOSIS — F3289 Other specified depressive episodes: Secondary | ICD-10-CM | POA: Insufficient documentation

## 2013-04-26 DIAGNOSIS — K2940 Chronic atrophic gastritis without bleeding: Secondary | ICD-10-CM | POA: Insufficient documentation

## 2013-04-26 DIAGNOSIS — F329 Major depressive disorder, single episode, unspecified: Secondary | ICD-10-CM | POA: Insufficient documentation

## 2013-04-26 DIAGNOSIS — I10 Essential (primary) hypertension: Secondary | ICD-10-CM | POA: Insufficient documentation

## 2013-04-26 DIAGNOSIS — F411 Generalized anxiety disorder: Secondary | ICD-10-CM | POA: Insufficient documentation

## 2013-04-26 DIAGNOSIS — K219 Gastro-esophageal reflux disease without esophagitis: Secondary | ICD-10-CM | POA: Insufficient documentation

## 2013-04-26 DIAGNOSIS — K449 Diaphragmatic hernia without obstruction or gangrene: Secondary | ICD-10-CM

## 2013-04-26 DIAGNOSIS — K222 Esophageal obstruction: Secondary | ICD-10-CM | POA: Insufficient documentation

## 2013-04-26 DIAGNOSIS — A048 Other specified bacterial intestinal infections: Secondary | ICD-10-CM | POA: Insufficient documentation

## 2013-04-26 DIAGNOSIS — N289 Disorder of kidney and ureter, unspecified: Secondary | ICD-10-CM | POA: Insufficient documentation

## 2013-04-26 DIAGNOSIS — Z79899 Other long term (current) drug therapy: Secondary | ICD-10-CM | POA: Insufficient documentation

## 2013-04-26 HISTORY — PX: ESOPHAGOGASTRODUODENOSCOPY (EGD) WITH ESOPHAGEAL DILATION: SHX5812

## 2013-04-26 SURGERY — ESOPHAGOGASTRODUODENOSCOPY (EGD) WITH ESOPHAGEAL DILATION
Anesthesia: Moderate Sedation

## 2013-04-26 MED ORDER — SODIUM CHLORIDE 0.9 % IV SOLN
INTRAVENOUS | Status: DC
  Administered 2013-04-26: 11:00:00 via INTRAVENOUS

## 2013-04-26 MED ORDER — BIS SUBCIT-METRONID-TETRACYC 140-125-125 MG PO CAPS: 3.0000 | ORAL_CAPSULE | Freq: Three times a day (TID) | ORAL | Status: DC

## 2013-04-26 MED ORDER — MEPERIDINE HCL 50 MG/ML IJ SOLN
INTRAMUSCULAR | Status: AC
  Filled 2013-04-26: qty 1

## 2013-04-26 MED ORDER — STERILE WATER FOR IRRIGATION IR SOLN
Status: DC | PRN
  Administered 2013-04-26: 11:00:00

## 2013-04-26 MED ORDER — MIDAZOLAM HCL 5 MG/5ML IJ SOLN
INTRAMUSCULAR | Status: AC
  Filled 2013-04-26: qty 10

## 2013-04-26 MED ORDER — MEPERIDINE HCL 25 MG/ML IJ SOLN: INTRAMUSCULAR | Status: DC | PRN

## 2013-04-26 MED ORDER — MIDAZOLAM HCL 5 MG/5ML IJ SOLN
INTRAMUSCULAR | Status: DC | PRN
  Administered 2013-04-26 (×2): 3 mg via INTRAVENOUS
  Administered 2013-04-26 (×3): 2 mg via INTRAVENOUS

## 2013-04-26 MED ORDER — FENTANYL CITRATE 0.05 MG/ML IJ SOLN
INTRAMUSCULAR | Status: AC
  Filled 2013-04-26: qty 2

## 2013-04-26 MED ORDER — MIDAZOLAM HCL 5 MG/5ML IJ SOLN
INTRAMUSCULAR | Status: AC
  Filled 2013-04-26: qty 5

## 2013-04-26 MED ORDER — FENTANYL CITRATE 0.05 MG/ML IJ SOLN
INTRAMUSCULAR | Status: DC | PRN
  Administered 2013-04-26 (×4): 25 ug via INTRAVENOUS

## 2013-04-26 NOTE — H&P (Signed)
Cameron Atkins is an 67 y.o. male.   Chief Complaint: Patient's here for EGD and ED. HPI: Patient is 67 year old Caucasian male who has history of GERD complicated by esophageal stricture. He presented about 5 weeks ago and this or facial food impaction and was treated by Dr. Jonette Eva. Food bolus was removed and stricture was dilated from 11-14 mm Savary dilator. He was advised to return for repeat dilation in 2 weeks. He was also found to have H. pylori gastritis but has not been treated yet. Patient says his heartburn is well controlled with PPI but he has intermittent chest tightness which is relieved with lorazepam. He has good appetite and denies melena or weight loss.  Past Medical History  Diagnosis Date  . Hypertension     for 5 yrs  . Depression   . GERD (gastroesophageal reflux disease)   . Anxiety   . Renal disorder     Past Surgical History  Procedure Laterality Date  . Lithotripsy    . Joint replacement    . Esophagogastroduodenoscopy N/A 03/21/2013    Procedure: ESOPHAGOGASTRODUODENOSCOPY (EGD);  Surgeon: West Bali, MD;  Location: [facility];  Service: Endoscopy;  Laterality: N/A;  . Esophageal dilation  03/21/2013    Procedure: ESOPHAGEAL DILATION;  Surgeon: West Bali, MD;  Location: [facility];  Service: Endoscopy;;    No family history on file. Social History:  reports that he has never smoked. He does not have any smokeless tobacco history on file. He reports that  drinks alcohol. He reports that he does not use illicit drugs.  Allergies:  Allergies  Allergen Reactions  . Mepergan (Meperidine-Promethazine)     Medications Prior to Admission  Medication Sig Dispense Refill  . fish oil-omega-3 fatty acids 1000 MG capsule Take 2 g by mouth daily.        Marland Kitchen gabapentin (NEURONTIN) 600 MG tablet Take 600 mg by mouth 3 (three) times daily.        . hydrochlorothiazide (HYDRODIURIL) 25 MG tablet Take 25 mg by mouth daily.        Marland Kitchen lisinopril  (PRINIVIL,ZESTRIL) 10 MG tablet Take 10 mg by mouth daily.        Marland Kitchen LORazepam (ATIVAN) 1 MG tablet Take 1 mg by mouth 4 (four) times daily - after meals and at bedtime.        . Multiple Vitamins-Minerals (MULTIVITAMINS THER. W/MINERALS) TABS Take 1 tablet by mouth daily.      Marland Kitchen OVER THE COUNTER MEDICATION Take 2 tablets by mouth daily. Calcium-magnesium-vitamin k-vitamin d-potassium supplement.      . pantoprazole (PROTONIX) 40 MG tablet Take 1 tablet (40 mg total) by mouth daily.  30 tablet  5  . vitamin E 1000 UNIT capsule Take 2,000 Units by mouth daily.        No results found for this or any previous visit (from the past 48 hour(s)). No results found.  ROS  Blood pressure 122/77, pulse 66, temperature 98 F (36.7 C), resp. rate 18, SpO2 98.00%. Physical Exam  Constitutional: He appears well-developed and well-nourished.  HENT:  Mouth/Throat: Oropharynx is clear and moist.  Eyes: Conjunctivae are normal. No scleral icterus.  Neck: No thyromegaly present.  Cardiovascular: Normal rate, regular rhythm and normal heart sounds.   No murmur heard. Respiratory: Effort normal and breath sounds normal.  GI: Soft. He exhibits no distension and no mass. There is no tenderness.  Musculoskeletal: He exhibits no edema.  Lymphadenopathy:  He has no cervical adenopathy.  Neurological: He is alert.  Skin: Skin is warm and dry.     Assessment/Plan Distal esophageal stricture. EGD with ED.  [name] U 04/26/2013, 11:39 AM

## 2013-04-26 NOTE — Op Note (Signed)
EGD PROCEDURE REPORT  PATIENT:  Cameron Atkins  MR#:  [id number] Birthdate:  [birth year], 67 y.o., male Endoscopist:  Dr. Malissa Hippo, MD Referred By:  Dr. Ernestine Conrad, MD Procedure Date: 04/26/2013  Procedure:   EGD with ED(balloon).  Indications:  Patient is 67 year old Caucasian male with chronic GERD complicated by distal esophageal stricture who underwent emergency EGD in 5 weeks ago with removal of esophageal foreign body. Stricture was dilated from 11-14 mm with Savary dilator by Dr. Darrick Penna. He's not returning to carry dilation to 18 mm. He has had intermittent chest tightness. He has been able to swallow much better. He has gone back to pantoprazole and his heartburn is well controlled. On his last exam he was found to have H. pylori gastritis but has not been treated yet.            Informed Consent:  The risks, benefits, alternatives & imponderables which include, but are not limited to, bleeding, infection, perforation, drug reaction and potential missed lesion have been reviewed.  The potential for biopsy, lesion removal, esophageal dilation, etc. have also been discussed.  Questions have been answered.  All parties agreeable.  Please see history & physical in medical record for more information.  Medications:  Fentanyl 100 mcg IV Versed 12 mg IV Cetacaine spray topically for oropharyngeal anesthesia  Description of procedure:  The endoscope was introduced through the mouth and advanced to the second portion of the duodenum without difficulty or limitations. The mucosal surfaces were surveyed very carefully during advancement of the scope and upon withdrawal.  Findings:  Esophagus:  Mucosa of the esophagus was normal. Healed esophagitis with stricture at GE junction. Able to pass scope across it without difficulty. GEJ:  34 cm Hiatus:  38 cm Stomach:  Stomach was empty and distended very well with insufflation. Folds in the proximal stomach were normal. Examination of mucosa at  body was normal. There was focal patchy erythema to mucosa at antrum. No erosions or ulcers are noted. Bubbler channel was patent. Angularis fundus and cardia were examined by retroflex the scope and were normal. Duodenum:  Normal bulbar and post bulbar mucosa.  Therapeutic/Diagnostic Maneuvers Performed:   Stricture GE junction was dilated with balloon dilator. Balloon dilator was passed with the scope and dye was pushed into gastric lumen. Balloon dilator was positioned across the stricture and insufflated to dilator 15 mm, 16.5 and finally 18 mm resulting in focal mucosal disruption. Balloon was deflated and withdrawn.  Complications:  None  Impression: Healed esophagitis. Stricture at GE junction was dilated with a balloon from 15-18 mm. Small to moderate size sliding hiatal hernia. Known H. pylori gastritis.  Recommendations:  Continue anti-reflux measures and Pantoprazole. Pylera 3 capsules by mouth 4 times a day for 10 days. Office visit in 3 months.  REHMAN,NAJEEB U  04/26/2013  12:12 PM  CC: Dr. Ernestine Conrad, MD & Dr. Bonnetta Barry ref. provider found

## 2013-04-27 ENCOUNTER — Encounter (HOSPITAL_COMMUNITY): Payer: Self-pay | Admitting: Internal Medicine

## 2013-05-09 ENCOUNTER — Telehealth (INDEPENDENT_AMBULATORY_CARE_PROVIDER_SITE_OTHER): Payer: Self-pay | Admitting: *Deleted

## 2013-05-09 NOTE — Telephone Encounter (Signed)
Patient was called and a message was left with the following instructions on how to take the medication: He will take 3 capsules by mouth 4 times a day for 10 days. It was suggested that he take 3 at breakfast, lunch,dinner, and bedtime.  He was advised that he may notice a change in the color of his bowel movement (Darker) and this would be the result of the medication. If he had anymore questions to please call our office back.

## 2013-05-09 NOTE — Telephone Encounter (Signed)
Cameron Atkins is not sure how to take the medicine Pylera and also has a question about it. Please return the call to [phone].

## 2013-05-09 NOTE — Telephone Encounter (Signed)
I have talked with the patient and at that time verbalized understanding of how to take medications.

## 2013-05-14 ENCOUNTER — Telehealth (INDEPENDENT_AMBULATORY_CARE_PROVIDER_SITE_OTHER): Payer: Self-pay | Admitting: *Deleted

## 2013-05-14 NOTE — Telephone Encounter (Signed)
Cameron Atkins would like to speak with Cameron Atkins - the Pylera is making him sick. He is losing sleep, swimmy headed and messing with his nerves. The return phone number is [phone].

## 2013-05-14 NOTE — Telephone Encounter (Signed)
Per Dr.Rehman the patient may stop the medication. Give the patient an appointment in 1 month Patient called and made aware.

## 2013-05-15 NOTE — Telephone Encounter (Signed)
Forwarded to Alden Server to make an appointment for 1 month.

## 2013-05-15 NOTE — Telephone Encounter (Signed)
LM for patient to return the call to schedule a f/u apt.   

## 2013-05-16 NOTE — Telephone Encounter (Signed)
Apt has been scheduled with Dr. Karilyn Cota on 06/26/13.

## 2013-05-16 NOTE — Telephone Encounter (Signed)
Cameron Atkins plans to call the patient on 05/16/13 to make an appointment,

## 2013-06-26 ENCOUNTER — Encounter (INDEPENDENT_AMBULATORY_CARE_PROVIDER_SITE_OTHER): Payer: Self-pay | Admitting: Internal Medicine

## 2013-06-26 ENCOUNTER — Ambulatory Visit (INDEPENDENT_AMBULATORY_CARE_PROVIDER_SITE_OTHER): Payer: Medicare Other | Admitting: Internal Medicine

## 2013-06-26 VITALS — BP 124/74 | HR 76 | Temp 98.8°F | Resp 18 | Ht 66.0 in | Wt 153.4 lb

## 2013-06-26 DIAGNOSIS — F419 Anxiety disorder, unspecified: Secondary | ICD-10-CM | POA: Insufficient documentation

## 2013-06-26 DIAGNOSIS — M810 Age-related osteoporosis without current pathological fracture: Secondary | ICD-10-CM | POA: Insufficient documentation

## 2013-06-26 DIAGNOSIS — K219 Gastro-esophageal reflux disease without esophagitis: Secondary | ICD-10-CM

## 2013-06-26 MED ORDER — CIMETIDINE 400 MG PO TABS: 400.0000 mg | ORAL_TABLET | Freq: Every day | ORAL | Status: AC | PRN

## 2013-06-26 NOTE — Progress Notes (Signed)
Presenting complaint;  Followup for chronic GERD complicated by esophageal stricture.  Database;  Patient is a 67 year old Caucasian male who has chronic GERD complicated by esophageal stricture. He underwent EGD with foreign body removal and esophageal dilation by Dr. Jonette Eva on 03/21/2013. Esophageal stricture was redilated on 04/26/2013 to 18 mm with a balloon. Esophagitis had healed. He also had small sliding hiatal hernia. He was prescribed Pylera for H. pylori gastritis. He was only able to take this combination for 6 days as it made him very nervous and he was not able to sleep. Since he has osteoporosis  Dr. Loney Hering wanted him to try H2 B. but his symptoms were not well controlled controlled so he is back on pantoprazole.  Subjective;  He feels much better. He has no difficulty swallowing whatsoever. He rarely experiences heartburn. He does not take cimetidine very often. He tells me at times he takes cimetidine so that lorazepam would work better(drug interaction). He denies abdominal pain melena or rectal bleeding. Printout from Dr. Ardelle Lesches office indicates that he is in Naprosyn 500 mg twice daily but he states he is not taking this medication. He is prescribed this medication by his podiatrist but he decided not to take this medication. He rarely takes Advil OTC for joint pains. He states since he has been taking portion blue fish oil his shoulder pain has resolved.       Current Medications: Current Outpatient Prescriptions  Medication Sig Dispense Refill  . Cholecalciferol (VITAMIN D3) 3000 UNITS TABS Take 2,000 Units by mouth.      . cimetidine (TAGAMET) 400 MG tablet Take 400 mg by mouth 2 (two) times daily.       . fish oil-omega-3 fatty acids 1000 MG capsule Take 2 g by mouth daily.        Marland Kitchen gabapentin (NEURONTIN) 600 MG tablet Take 600 mg by mouth 3 (three) times daily.        . hydrochlorothiazide (HYDRODIURIL) 25 MG tablet Take 25 mg by mouth daily.        Marland Kitchen lisinopril  (PRINIVIL,ZESTRIL) 10 MG tablet Take 10 mg by mouth daily.        Marland Kitchen LORazepam (ATIVAN) 1 MG tablet Take 1 mg by mouth 4 (four) times daily - after meals and at bedtime.        . Multiple Vitamins-Minerals (MULTIVITAMINS THER. W/MINERALS) TABS Take 1 tablet by mouth daily.      Marland Kitchen OVER THE COUNTER MEDICATION Take 2 tablets by mouth daily. Calcium-magnesium-vitamin k-vitamin d-potassium supplement.      . pantoprazole (PROTONIX) 40 MG tablet Take 1 tablet (40 mg total) by mouth daily.  30 tablet  5  . bismuth-metronidazole-tetracycline (PYLERA) 140-125-125 MG per capsule Take 3 capsules by mouth 4 (four) times daily -  before meals and at bedtime.  120 capsule  0   No current facility-administered medications for this visit.     Objective: Blood pressure 124/74, pulse 76, temperature 98.8 F (37.1 C), temperature source Oral, resp. rate 18, height 5\' 6"  (1.676 m), weight 153 lb 6.4 oz (69.582 kg). Patient is alert and in no acute distress. Conjunctiva is pink. Sclera is nonicteric Oropharyngeal mucosa is normal. No neck masses or thyromegaly noted.. Abdomen is full but soft and nontender without organomegaly or masses.  No LE edema or clubbing noted.   Assessment:  #1. GERD complicated by esophageal stricture. He is presently doing very well with therapy. His symptoms are not well controlled with H2B. He therefore  needs to stay on PPI. Esophageal stricture was last dilated 2 months ago. #2. H. pylori gastritis. He was only able to take Pylera for 6 days. Hopefully his infection has been eradicated.    Plan:  Patient advised to exercise on regular basis including weightbearing exercises with light weights since he has osteoporosis. He should take cimetidine only for breakthrough symptoms in the evening. Office visit in 6 months. He will call if dysphagia recurs.

## 2013-06-26 NOTE — Patient Instructions (Signed)
Call if you have swallowing difficulty. 

## 2013-07-23 ENCOUNTER — Other Ambulatory Visit: Payer: Self-pay | Admitting: Dermatology

## 2013-09-25 ENCOUNTER — Ambulatory Visit (INDEPENDENT_AMBULATORY_CARE_PROVIDER_SITE_OTHER): Payer: Medicare Other | Admitting: Internal Medicine

## 2013-12-24 ENCOUNTER — Ambulatory Visit (INDEPENDENT_AMBULATORY_CARE_PROVIDER_SITE_OTHER): Payer: Medicare Other | Admitting: Internal Medicine

## 2013-12-24 ENCOUNTER — Encounter (INDEPENDENT_AMBULATORY_CARE_PROVIDER_SITE_OTHER): Payer: Self-pay | Admitting: Internal Medicine

## 2013-12-24 VITALS — BP 118/68 | HR 64 | Temp 97.4°F | Ht 66.0 in | Wt 157.8 lb

## 2013-12-24 DIAGNOSIS — K222 Esophageal obstruction: Secondary | ICD-10-CM

## 2013-12-24 DIAGNOSIS — K219 Gastro-esophageal reflux disease without esophagitis: Secondary | ICD-10-CM

## 2013-12-24 NOTE — Patient Instructions (Signed)
Continue the Protonix. OV 6 months.  

## 2013-12-24 NOTE — Progress Notes (Signed)
Subjective:     Patient ID: Cameron Atkins, male   DOB: May 18, 1946, 68 y.o.   MRN: [id number]  HPI Here today for f/u of his chronic GERD. Hx of esophageal stricture. Tells me he is doing good. He had an episode 5 days ago. He ate an apple pecan salad. He had a lot of indigestion afterwards. He hurt in his epigastric region.  Appetite is not good. He can go all day without eating.  No weight loss. He doesn't eat after 7pm. There is no dysphagia. He usually has a BM daily. Hx of depression  04/26/2013 EGD/ED: Solid food dysphagia: EGD/ED 5 weeks prior for foreign body inpaction/esophageal stricture by Dr. Oneida Alar. Complications: None  Impression:  Healed esophagitis.  Stricture at GE junction was dilated with a balloon from 15-18 mm.  Small to moderate size sliding hiatal hernia.  Known H. pylori gastritis.   02/10/2011 EGD/ED, Colonoscopy: FINAL DIAGNOSES:  1. 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 The  stricture was dilated to 18 mm with a balloon.  2. A 2-cm size sliding hiatal hernia.  3. Normal exam. of the stomach, first and second part of the duodenum.  4. Moderate left-sided diverticulosis.  5. A 3-mm polyp ablated via cold biopsy from the ascending colon.  Biopsy: tubular adenoma.   Review of Systems   Past Medical History  Diagnosis Date  . Hypertension     for 5 yrs  . Depression   . GERD (gastroesophageal reflux disease)   . Anxiety   . Renal disorder     Past Surgical History  Procedure Laterality Date  . Lithotripsy    . Joint replacement    . Esophagogastroduodenoscopy N/A 03/21/2013    Procedure: ESOPHAGOGASTRODUODENOSCOPY (EGD);  Surgeon: Danie Binder, MD;  [facility]: [facility];  Service: Endoscopy;  Laterality: N/A;  . Esophageal dilation  03/21/2013    Procedure: ESOPHAGEAL DILATION;  Surgeon: Danie Binder, MD;  Location: [facility];  Service: Endoscopy;;  . Esophagogastroduodenoscopy (egd) with esophageal dilation N/A 04/26/2013     Procedure: ESOPHAGOGASTRODUODENOSCOPY (EGD) WITH ESOPHAGEAL DILATION;  Surgeon: Rogene Houston, MD;  Location: [facility];  Service: Endoscopy;  Laterality: N/A;  130    Allergies  Allergen Reactions  . Mepergan [Meperidine-Promethazine]     Vomiting.     Current Outpatient Prescriptions on File Prior to Visit  Medication Sig Dispense Refill  . Cholecalciferol (VITAMIN D3) 3000 UNITS TABS Take 2,000 Units by mouth.      . cimetidine (TAGAMET) 400 MG tablet Take 1 tablet (400 mg total) by mouth daily as needed.  30 tablet  5  . fish oil-omega-3 fatty acids 1000 MG capsule Take 2 g by mouth daily.        Marland Kitchen gabapentin (NEURONTIN) 600 MG tablet Take 600 mg by mouth 3 (three) times daily.        . hydrochlorothiazide (HYDRODIURIL) 25 MG tablet Take 25 mg by mouth daily.        Marland Kitchen lisinopril (PRINIVIL,ZESTRIL) 10 MG tablet Take 10 mg by mouth daily.        Marland Kitchen LORazepam (ATIVAN) 1 MG tablet Take 1 mg by mouth 4 (four) times daily - after meals and at bedtime.        Marland Kitchen OVER THE COUNTER MEDICATION Take 2 tablets by mouth daily. Calcium-magnesium-vitamin k-vitamin d-potassium supplement.      . pantoprazole (PROTONIX) 40 MG tablet Take 1 tablet (40 mg total) by mouth daily.  [place]  tablet  5  . Multiple Vitamins-Minerals (MULTIVITAMINS THER. W/MINERALS) TABS Take 1 tablet by mouth daily.       No current facility-administered medications on file prior to visit.        Objective:   Physical Exam  Filed Vitals:   12/24/13 1103  BP: 118/68  Pulse: 64  Temp: 97.4 F (36.3 C)  Height: 5\' 6"  (1.676 m)  Weight: 157 lb 12.8 oz (71.578 kg)   Alert and oriented. Skin warm and dry. Oral mucosa is moist.   . Sclera anicteric, conjunctivae is pink. Thyroid not enlarged. No cervical lymphadenopathy. Lungs clear. Heart regular rate and rhythm.  Abdomen is soft. Bowel sounds are positive. No hepatomegaly. No abdominal masses felt. No tenderness.  No edema to lower extremities.       Assessment:      GERD. For the most part, is controlled with Protonix.   Esophageal stricture: no problems with dysphagia at this time. Last EGD/ED in July of September of 2014. Plan:     Continue Protonix. OV in 6 months.

## 2014-01-03 ENCOUNTER — Other Ambulatory Visit: Payer: Self-pay | Admitting: Urology

## 2014-01-10 ENCOUNTER — Encounter (HOSPITAL_BASED_OUTPATIENT_CLINIC_OR_DEPARTMENT_OTHER): Payer: Self-pay | Admitting: *Deleted

## 2014-01-14 ENCOUNTER — Encounter (HOSPITAL_BASED_OUTPATIENT_CLINIC_OR_DEPARTMENT_OTHER): Payer: Self-pay | Admitting: *Deleted

## 2014-01-14 NOTE — H&P (Signed)
tive Problems Problems   1. Calculus of kidney (592.0)  2. Left groin pain (789.09)  History of Present Illness  Cameron Atkins is a 68 yo WM with a history of stones.  He had the onset of left inguinal and suprapubic pain about 6 weeks ago.  The pain has been intermittent and is more of an ache more than a pain.  He has no frequency or urgency.  He has had no hematuria.  He has had prior stones and may have passed on in about a year.  He had several when he was younger.  He had 2 prior ESWL's and then had Dr. Ernst Spell in [place] do ureteroscopy with lithotripsy and stenting.  He cleared the stones after stent removal.  The pain is similar to his prior stones.   His stones were calcium.   Past Medical History Problems   1. History of Anxiety (300.00)  2. Calculus of kidney (592.0)  3. History of arthritis (V13.4)  4. History of asthma (V12.69)  5. History of depression (V11.8)  6. History of esophageal reflux (V12.79)  7. History of hypertension (V12.59)  Surgical History Problems   1. History of Cystoscopy With Ureteroscopy  2. History of Lithotripsy  3. History of Shoulder Surgery Left  Current Meds  1. Gabapentin 600 MG Oral Tablet;  Therapy: (Recorded:08Apr2015) to Recorded  2. Lisinopril TABS;  Therapy: (Recorded:08Apr2015) to Recorded  3. LORazepam TABS;  Therapy: (Recorded:08Apr2015) to Recorded  4. Pantoprazole Sodium TBEC;  Therapy: (Recorded:08Apr2015) to Recorded  Allergies Medication   1. Sulfa Drugs  2. Benadryl  3. Benadryl TABS  Family History Problems   1. Family history of Deceased : Father  2. No pertinent family history : Mother  Social History Problems    Alcohol use (V49.89)   Caffeine use (V49.89)   Former smoker (V15.82)   Married   Number of children   Occupation  Review of Systems Genitourinary, constitutional, skin, eye, otolaryngeal, hematologic/lymphatic, cardiovascular, pulmonary, endocrine, musculoskeletal, gastrointestinal,  neurological and psychiatric system(s) were reviewed and pertinent findings if present are noted.  Genitourinary: erectile dysfunction.  Constitutional: night sweats.  Psychiatric: anxiety and depression.    Vitals Vital Signs [Data Includes: Last 1 Day]  Recorded: 08Apr2015 03:26PM  Height: 5 ft 5.5 in Weight: 155 lb  BMI Calculated: 25.4 BSA Calculated: 1.78 Blood Pressure: 146 / 85 Temperature: 97.6 F Heart Rate: 66  Physical Exam Constitutional: Well nourished and well developed . No acute distress.  ENT:. The ears and nose are normal in appearance.  Neck: The appearance of the neck is normal and no neck mass is present.  Pulmonary: No respiratory distress and normal respiratory rhythm and effort.  Cardiovascular: Heart rate and rhythm are normal . No peripheral edema.  Abdomen: The abdomen is soft and nontender. No masses are palpated. No CVA tenderness. No hernias are palpable. No hepatosplenomegaly noted.  Rectal: Rectal exam demonstrates normal sphincter tone, no tenderness and no masses. Estimated prostate size is 2+. The prostate has no nodularity and is not tender. The left seminal vesicle is nonpalpable. The right seminal vesicle is nonpalpable. The perineum is normal on inspection.  Genitourinary: Examination of the penis demonstrates no discharge, no masses, no lesions and a normal meatus. The penis is uncircumcised. The scrotum is without lesions. The right epididymis is palpably normal and non-tender. The left epididymis is palpably normal and non-tender. The right testis is non-tender and without masses. The left testis is non-tender and without masses.  Lymphatics: The  supraclavicular and inguinal nodes are not enlarged or tender.  Skin: Normal skin turgor, no visible rash and no visible skin lesions.  Neuro/Psych:. Mood and affect are appropriate.    Results/Data Urine [Data Includes: Last 1 Day]   08Apr2015  COLOR YELLOW   APPEARANCE CLEAR   SPECIFIC GRAVITY  <1.005   pH 7.0   GLUCOSE NEG mg/dL  BILIRUBIN NEG   KETONE NEG mg/dL  BLOOD NEG   PROTEIN NEG mg/dL  UROBILINOGEN 0.2 mg/dL  NITRITE NEG   LEUKOCYTE ESTERASE NEG    The following images/tracing/specimen were independently visualized:  I have reviewed his prior LS spine series from 2012 and his stones are radiopaque. There is a 10x22mm left proximal stone and a RUP stone along with a LLP stone noted. KUB today shows that the RUP stone is now in the bladder and the left proximal stone is unchanged in size but is at the [place]. There is an 98mm RLP stone and a 40mm LLP stone as well. He has mild lumbar degenerative disease but no other abnormalities. A renal US today shows a 1.21cm LUVJ stone without obstruction. The bladder stone is not seen. The right kidney is 12.37cm in size with multiple simple cysts with a pair at the upper pole that are 6.7 and 5.5cm in size. There is a 1.3cm RLP stone but no hydro. The left kidney has no hydro or stones. There is a hyperechogenic focus that could be scar at the UP that is 1cm in size and he has multiple simple cysts with the largest being 6.25cm at the lower pole. The bladder is unremarkable and contained 150cc 1hr post voiding. See study sheet for details.  The following clinical lab reports were reviewed:  UA reviewed. PSA's from Dr. Wenda Overland reviewed. The last was 1.1.    Assessment Assessed   1. Calculus of kidney (592.0)  2. Left groin pain (789.09)  3. Calculus of left ureter (592.1)  4. Bladder calculus (594.1)  5. Renal cysts, acquired, bilateral (593.2)      He has a 7x11 LUVJ stone that was in the proximal left ureter in 2012 on a LS series.   He had bilateral renal stones and he has an 43mm bladder stone as well. He has bilateral renal simple cysts.   Plan Bladder calculus, Calculus of kidney, Calculus of left ureter   1. RENAL U/S COMPLETE; Status:Resulted - Requires Verification;   Done: [id number]  12:00AM Calculus of kidney   2. KUB;  Status:Resulted - Requires Verification;   Done: [id number] 12:00AM Calculus of left ureter   3. Follow-up Schedule Surgery Office  Follow-up  Status: Complete  Done: 08Apr2015 Health Maintenance   4. UA With REFLEX; [Do Not Release]; Status:Resulted - Requires Verification;   Done:  [id number] 02:54PM      I am going to get him set up for cystoscopy with removal of the bladder stone and left ureteroscopic stone extraction.   I have reviewed the risks of bleeding, infection, ureteral injury, need for a stent or secondary procedures, thrombotic events and anesthetic complications.   Discussion/Summary  CC: Dr. Celedonio Savage

## 2014-01-14 NOTE — Progress Notes (Signed)
NPO AFTER MN. ARRIVE AT [id number].  NEEDS ISTAT AND EKG. WILL TAKE ATIVAN AND PROTONIX AM DOS W/ SIPS OF WATER.

## 2014-01-15 ENCOUNTER — Ambulatory Visit (HOSPITAL_BASED_OUTPATIENT_CLINIC_OR_DEPARTMENT_OTHER): Payer: Medicare Other | Admitting: Anesthesiology

## 2014-01-15 ENCOUNTER — Ambulatory Visit (HOSPITAL_BASED_OUTPATIENT_CLINIC_OR_DEPARTMENT_OTHER)
Admission: RE | Admit: 2014-01-15 | Discharge: 2014-01-15 | Disposition: A | Discharge status: Home / Self Care | Payer: Medicare Other | Source: Ambulatory Visit | Attending: Urology | Admitting: Urology

## 2014-01-15 ENCOUNTER — Encounter (HOSPITAL_BASED_OUTPATIENT_CLINIC_OR_DEPARTMENT_OTHER)
Admission: RE | Disposition: A | Discharge status: Home / Self Care | Payer: Self-pay | Source: Ambulatory Visit | Attending: Urology

## 2014-01-15 ENCOUNTER — Encounter (HOSPITAL_BASED_OUTPATIENT_CLINIC_OR_DEPARTMENT_OTHER): Payer: Self-pay

## 2014-01-15 ENCOUNTER — Other Ambulatory Visit: Payer: Self-pay

## 2014-01-15 ENCOUNTER — Encounter (HOSPITAL_BASED_OUTPATIENT_CLINIC_OR_DEPARTMENT_OTHER): Payer: Medicare Other | Admitting: Anesthesiology

## 2014-01-15 DIAGNOSIS — N200 Calculus of kidney: Secondary | ICD-10-CM | POA: Diagnosis present

## 2014-01-15 DIAGNOSIS — N323 Diverticulum of bladder: Secondary | ICD-10-CM | POA: Insufficient documentation

## 2014-01-15 DIAGNOSIS — J45909 Unspecified asthma, uncomplicated: Secondary | ICD-10-CM | POA: Insufficient documentation

## 2014-01-15 DIAGNOSIS — K219 Gastro-esophageal reflux disease without esophagitis: Secondary | ICD-10-CM | POA: Insufficient documentation

## 2014-01-15 DIAGNOSIS — N401 Enlarged prostate with lower urinary tract symptoms: Secondary | ICD-10-CM | POA: Insufficient documentation

## 2014-01-15 DIAGNOSIS — F329 Major depressive disorder, single episode, unspecified: Secondary | ICD-10-CM | POA: Insufficient documentation

## 2014-01-15 DIAGNOSIS — F3289 Other specified depressive episodes: Secondary | ICD-10-CM | POA: Insufficient documentation

## 2014-01-15 DIAGNOSIS — M129 Arthropathy, unspecified: Secondary | ICD-10-CM | POA: Insufficient documentation

## 2014-01-15 DIAGNOSIS — N201 Calculus of ureter: Secondary | ICD-10-CM | POA: Diagnosis present

## 2014-01-15 DIAGNOSIS — N281 Cyst of kidney, acquired: Secondary | ICD-10-CM | POA: Insufficient documentation

## 2014-01-15 DIAGNOSIS — Z882 Allergy status to sulfonamides status: Secondary | ICD-10-CM | POA: Insufficient documentation

## 2014-01-15 DIAGNOSIS — N139 Obstructive and reflux uropathy, unspecified: Secondary | ICD-10-CM | POA: Insufficient documentation

## 2014-01-15 DIAGNOSIS — Z888 Allergy status to other drugs, medicaments and biological substances status: Secondary | ICD-10-CM | POA: Insufficient documentation

## 2014-01-15 DIAGNOSIS — R61 Generalized hyperhidrosis: Secondary | ICD-10-CM | POA: Insufficient documentation

## 2014-01-15 DIAGNOSIS — Z79899 Other long term (current) drug therapy: Secondary | ICD-10-CM | POA: Insufficient documentation

## 2014-01-15 DIAGNOSIS — N138 Other obstructive and reflux uropathy: Secondary | ICD-10-CM | POA: Insufficient documentation

## 2014-01-15 DIAGNOSIS — Z87891 Personal history of nicotine dependence: Secondary | ICD-10-CM | POA: Insufficient documentation

## 2014-01-15 DIAGNOSIS — N529 Male erectile dysfunction, unspecified: Secondary | ICD-10-CM | POA: Insufficient documentation

## 2014-01-15 DIAGNOSIS — F411 Generalized anxiety disorder: Secondary | ICD-10-CM | POA: Insufficient documentation

## 2014-01-15 DIAGNOSIS — I10 Essential (primary) hypertension: Secondary | ICD-10-CM | POA: Insufficient documentation

## 2014-01-15 HISTORY — DX: Presence of spectacles and contact lenses: Z97.3

## 2014-01-15 HISTORY — DX: Diverticulosis of large intestine without perforation or abscess without bleeding: K57.30

## 2014-01-15 HISTORY — DX: Personal history of colon polyps, unspecified: Z86.0100

## 2014-01-15 HISTORY — DX: Other specified postprocedural states: Z98.890

## 2014-01-15 HISTORY — DX: Personal history of colonic polyps: Z86.010

## 2014-01-15 HISTORY — DX: Calculus of ureter: N20.1

## 2014-01-15 HISTORY — DX: Calculus in bladder: N21.0

## 2014-01-15 HISTORY — DX: Unspecified osteoarthritis, unspecified site: M19.90

## 2014-01-15 HISTORY — DX: Helicobacter pylori (H. pylori) as the cause of diseases classified elsewhere: K29.70

## 2014-01-15 HISTORY — DX: Personal history of other diseases of the digestive system: Z87.19

## 2014-01-15 HISTORY — PX: CYSTOSCOPY WITH RETROGRADE PYELOGRAM, URETEROSCOPY AND STENT PLACEMENT: SHX5789

## 2014-01-15 HISTORY — PX: HOLMIUM LASER APPLICATION: SHX5852

## 2014-01-15 HISTORY — DX: Gastritis, unspecified, without bleeding: K29.70

## 2014-01-15 HISTORY — PX: CYSTOSCOPY W/ RETROGRADES: SHX1426

## 2014-01-15 HISTORY — DX: Helicobacter pylori (H. pylori) as the cause of diseases classified elsewhere: B96.81

## 2014-01-15 LAB — POCT I-STAT 4, (NA,K, GLUC, HGB,HCT)
Glucose, Bld: 109 mg/dL — ABNORMAL HIGH (ref 70–99)
HCT: 39 % (ref 39.0–52.0)
Hemoglobin: 13.3 g/dL (ref 13.0–17.0)
Potassium: 3.7 mEq/L (ref 3.7–5.3)
SODIUM: 133 meq/L — AB (ref 137–147)

## 2014-01-15 SURGERY — CYSTOURETEROSCOPY, WITH RETROGRADE PYELOGRAM AND STENT INSERTION
Anesthesia: General | Site: Ureter | Laterality: Right

## 2014-01-15 MED ORDER — IOHEXOL 350 MG/ML SOLN
INTRAVENOUS | Status: DC | PRN
  Administered 2014-01-15: 10 mL via INTRAVENOUS

## 2014-01-15 MED ORDER — SODIUM CHLORIDE 0.9 % IV SOLN
250.0000 mL | INTRAVENOUS | Status: DC | PRN
  Filled 2014-01-15: qty 250

## 2014-01-15 MED ORDER — HYDROMORPHONE HCL PF 1 MG/ML IJ SOLN
0.2500 mg | INTRAMUSCULAR | Status: DC | PRN
  Administered 2014-01-15: 0.25 mg via INTRAVENOUS
  Filled 2014-01-15: qty 1

## 2014-01-15 MED ORDER — SODIUM CHLORIDE 0.9 % IJ SOLN
3.0000 mL | Freq: Two times a day (BID) | INTRAMUSCULAR | Status: DC
  Filled 2014-01-15: qty 3

## 2014-01-15 MED ORDER — PHENAZOPYRIDINE HCL 100 MG PO TABS
ORAL_TABLET | ORAL | Status: AC
  Filled 2014-01-15: qty 2

## 2014-01-15 MED ORDER — FENTANYL CITRATE 0.05 MG/ML IJ SOLN
INTRAMUSCULAR | Status: AC
  Filled 2014-01-15: qty 4

## 2014-01-15 MED ORDER — MEPERIDINE HCL 25 MG/ML IJ SOLN
6.2500 mg | INTRAMUSCULAR | Status: DC | PRN
  Filled 2014-01-15: qty 1

## 2014-01-15 MED ORDER — PROMETHAZINE HCL 25 MG/ML IJ SOLN
6.2500 mg | INTRAMUSCULAR | Status: DC | PRN
  Filled 2014-01-15: qty 1

## 2014-01-15 MED ORDER — SODIUM CHLORIDE 0.9 % IJ SOLN
3.0000 mL | INTRAMUSCULAR | Status: DC | PRN
  Filled 2014-01-15: qty 3

## 2014-01-15 MED ORDER — LIDOCAINE HCL (CARDIAC) 20 MG/ML IV SOLN
INTRAVENOUS | Status: DC | PRN
  Administered 2014-01-15: 20 mg via INTRAVENOUS

## 2014-01-15 MED ORDER — MORPHINE SULFATE 2 MG/ML IJ SOLN
1.0000 mg | INTRAMUSCULAR | Status: DC | PRN
Start: 2014-01-15 — End: 2014-01-15
  Filled 2014-01-15: qty 1

## 2014-01-15 MED ORDER — CIPROFLOXACIN IN D5W 400 MG/200ML IV SOLN
400.0000 mg | INTRAVENOUS | Status: AC
  Administered 2014-01-15: 400 mg via INTRAVENOUS
  Filled 2014-01-15: qty 200

## 2014-01-15 MED ORDER — ONDANSETRON HCL 4 MG/2ML IJ SOLN
INTRAMUSCULAR | Status: DC | PRN
  Administered 2014-01-15: 4 mg via INTRAVENOUS

## 2014-01-15 MED ORDER — OXYCODONE HCL 5 MG/5ML PO SOLN
5.0000 mg | Freq: Once | ORAL | Status: DC | PRN
  Filled 2014-01-15: qty 5

## 2014-01-15 MED ORDER — PHENAZOPYRIDINE HCL 200 MG PO TABS: 200.0000 mg | ORAL_TABLET | Freq: Three times a day (TID) | ORAL | Status: DC | PRN

## 2014-01-15 MED ORDER — ACETAMINOPHEN 325 MG PO TABS
650.0000 mg | ORAL_TABLET | ORAL | Status: DC | PRN
  Filled 2014-01-15: qty 2

## 2014-01-15 MED ORDER — OXYCODONE-ACETAMINOPHEN 5-325 MG PO TABS
1.0000 | ORAL_TABLET | ORAL | Status: DC | PRN
  Administered 2014-01-15: 1 via ORAL
  Filled 2014-01-15: qty 1

## 2014-01-15 MED ORDER — OXYCODONE-ACETAMINOPHEN 5-325 MG PO TABS: 1.0000 | ORAL_TABLET | ORAL | Status: DC | PRN

## 2014-01-15 MED ORDER — MIDAZOLAM HCL 2 MG/2ML IJ SOLN
INTRAMUSCULAR | Status: AC
  Filled 2014-01-15: qty 2

## 2014-01-15 MED ORDER — PHENAZOPYRIDINE HCL 200 MG PO TABS
200.0000 mg | ORAL_TABLET | Freq: Once | ORAL | Status: AC | PRN
  Administered 2014-01-15: 200 mg via ORAL
  Filled 2014-01-15: qty 1

## 2014-01-15 MED ORDER — PROPOFOL 10 MG/ML IV BOLUS
INTRAVENOUS | Status: DC | PRN
  Administered 2014-01-15: 150 mg via INTRAVENOUS

## 2014-01-15 MED ORDER — OXYCODONE HCL 5 MG PO TABS
5.0000 mg | ORAL_TABLET | ORAL | Status: DC | PRN
  Filled 2014-01-15: qty 2

## 2014-01-15 MED ORDER — HYDROMORPHONE HCL PF 1 MG/ML IJ SOLN
INTRAMUSCULAR | Status: AC
  Filled 2014-01-15: qty 1

## 2014-01-15 MED ORDER — LACTATED RINGERS IV SOLN
INTRAVENOUS | Status: DC
  Administered 2014-01-15 (×3): via INTRAVENOUS
  Filled 2014-01-15: qty 1000

## 2014-01-15 MED ORDER — OXYCODONE-ACETAMINOPHEN 5-325 MG PO TABS
ORAL_TABLET | ORAL | Status: AC
  Filled 2014-01-15: qty 1

## 2014-01-15 MED ORDER — MIDAZOLAM HCL 5 MG/5ML IJ SOLN
INTRAMUSCULAR | Status: DC | PRN
  Administered 2014-01-15: 2 mg via INTRAVENOUS

## 2014-01-15 MED ORDER — OXYCODONE HCL 5 MG PO TABS
5.0000 mg | ORAL_TABLET | Freq: Once | ORAL | Status: DC | PRN
  Filled 2014-01-15: qty 1

## 2014-01-15 MED ORDER — EPHEDRINE SULFATE 50 MG/ML IJ SOLN
INTRAMUSCULAR | Status: DC | PRN
  Administered 2014-01-15 (×2): 5 mg via INTRAVENOUS

## 2014-01-15 MED ORDER — ACETAMINOPHEN 650 MG RE SUPP
650.0000 mg | RECTAL | Status: DC | PRN
  Filled 2014-01-15: qty 1

## 2014-01-15 MED ORDER — FENTANYL CITRATE 0.05 MG/ML IJ SOLN
INTRAMUSCULAR | Status: DC | PRN
Start: 2014-01-15 — End: 2014-01-15
  Administered 2014-01-15: 100 ug via INTRAVENOUS

## 2014-01-15 MED ORDER — GLYCOPYRROLATE 0.2 MG/ML IJ SOLN
INTRAMUSCULAR | Status: DC | PRN
  Administered 2014-01-15: .1 mg via INTRAVENOUS

## 2014-01-15 MED ORDER — SODIUM CHLORIDE 0.9 % IR SOLN
Status: DC | PRN
  Administered 2014-01-15: 3000 mL via INTRAVESICAL

## 2014-01-15 SURGICAL SUPPLY — 43 items
6 X 26 CONTOUR STENT ×2 IMPLANT
BAG DRAIN URO-CYSTO SKYTR STRL (DRAIN) ×4 IMPLANT
BAG DRN UROCATH (DRAIN) ×2
BASKET LASER NITINOL 1.9FR (BASKET) IMPLANT
BASKET ZERO TIP NITINOL 2.4FR (BASKET) ×2 IMPLANT
BSKT STON RTRVL 120 1.9FR (BASKET)
BSKT STON RTRVL ZERO TP 2.4FR (BASKET) ×2
CANISTER SUCT LVC 12 LTR MEDI- (MISCELLANEOUS) ×2 IMPLANT
CATH URET 5FR 28IN CONE TIP (BALLOONS)
CATH URET 5FR 28IN OPEN ENDED (CATHETERS) ×2 IMPLANT
CATH URET 5FR 70CM CONE TIP (BALLOONS) IMPLANT
CLOTH BEACON ORANGE TIMEOUT ST (SAFETY) ×4 IMPLANT
DRAPE CAMERA CLOSED 9X96 (DRAPES) ×4 IMPLANT
ELECT REM PT RETURN 9FT ADLT (ELECTROSURGICAL)
ELECTRODE REM PT RTRN 9FT ADLT (ELECTROSURGICAL) IMPLANT
EXTRACTOR STONE NITINOL NGAGE (UROLOGICAL SUPPLIES) ×2 IMPLANT
FIBER LASER FLEXIVA 1000 (UROLOGICAL SUPPLIES) IMPLANT
FIBER LASER FLEXIVA 200 (UROLOGICAL SUPPLIES) IMPLANT
FIBER LASER FLEXIVA 365 (UROLOGICAL SUPPLIES) ×2 IMPLANT
FIBER LASER FLEXIVA 550 (UROLOGICAL SUPPLIES) IMPLANT
GLOVE BIO SURGEON STRL SZ 6.5 (GLOVE) ×1 IMPLANT
GLOVE BIO SURGEONS STRL SZ 6.5 (GLOVE) ×1
GLOVE INDICATOR 6.5 STRL GRN (GLOVE) ×2 IMPLANT
GLOVE SURG SS PI 8.0 STRL IVOR (GLOVE) ×4 IMPLANT
GOWN STRL REUS W/ TWL LRG LVL3 (GOWN DISPOSABLE) IMPLANT
GOWN STRL REUS W/ TWL XL LVL3 (GOWN DISPOSABLE) IMPLANT
GOWN STRL REUS W/TWL LRG LVL3 (GOWN DISPOSABLE) ×4
GOWN STRL REUS W/TWL XL LVL3 (GOWN DISPOSABLE) ×4
GUIDEWIRE 0.038 PTFE COATED (WIRE) IMPLANT
GUIDEWIRE ANG ZIPWIRE 038X150 (WIRE) IMPLANT
GUIDEWIRE STR DUAL SENSOR (WIRE) ×4 IMPLANT
IV NS 1000ML (IV SOLUTION) ×12
IV NS 1000ML BAXH (IV SOLUTION) IMPLANT
IV NS IRRIG 3000ML ARTHROMATIC (IV SOLUTION) ×2 IMPLANT
KIT BALLIN UROMAX 15FX10 (LABEL) IMPLANT
KIT BALLN UROMAX 15FX4 (MISCELLANEOUS) IMPLANT
KIT BALLN UROMAX 26 75X4 (MISCELLANEOUS)
PACK CYSTOSCOPY (CUSTOM PROCEDURE TRAY) ×4 IMPLANT
SET HIGH PRES BAL DIL (LABEL)
SHEATH ACCESS URETERAL 24CM (SHEATH) ×2 IMPLANT
SHEATH ACCESS URETERAL 38CM (SHEATH) IMPLANT
SHEATH ACCESS URETERAL 54CM (SHEATH) IMPLANT
STENT URET 6FRX26 CONTOUR (STENTS) ×2 IMPLANT

## 2014-01-15 NOTE — Anesthesia Procedure Notes (Signed)
Procedure Name: LMA Insertion Date/Time: 01/15/2014 10:01 AM Performed by: Irine Seal Pre-anesthesia Checklist: Patient identified, Emergency Drugs available, Suction available, Patient being monitored and Timeout performed Patient Re-evaluated:Patient Re-evaluated prior to inductionOxygen Delivery Method: Circle system utilized Preoxygenation: Pre-oxygenation with 100% oxygen Intubation Type: IV induction Ventilation: Mask ventilation without difficulty LMA: LMA inserted Dental Injury: Teeth and Oropharynx as per pre-operative assessment

## 2014-01-15 NOTE — Interval H&P Note (Signed)
History and Physical Interval Note:  01/15/2014 9:36 AM  [name]  has presented today for surgery, with the diagnosis of BLADDER STONE AND LEFT URETERAL STONE  The various methods of treatment have been discussed with the patient and family. After consideration of risks, benefits and other options for treatment, the patient has consented to  Procedure(s): CYSTOSCOPY WITH BLADDER STONE REMOVAL RETROGRADE PYELOGRAM, URETEROSCOPY AND POSSIBLE STENT PLACEMENT (Left) HOLMIUM LASER APPLICATION (Left) as a surgical intervention .  The patient's history has been reviewed, patient examined, no change in status, stable for surgery.  I have reviewed the patient's chart and labs.  Questions were answered to the patient's satisfaction.     Irine Seal

## 2014-01-15 NOTE — Anesthesia Postprocedure Evaluation (Signed)
Anesthesia Post Note  Patient: Cameron Atkins  Procedure(s) Performed: Procedure(s) (LRB): CYSTOSCOPY , LEFT RETROGRADE PYELOGRAM, LEFT URETEROSCOPY, BASKET STONE EXTRACTION (Left) HOLMIUM LASER APPLICATION, LEFT URETER (Left) CYSTOSCOPY WITH RIGHT RETROGRADE PYELOGRAM (Right)  Anesthesia type: General  Patient location: PACU  Post pain: Pain level controlled  Post assessment: Post-op Vital signs reviewed  Last Vitals: BP 134/75  Pulse 67  Temp(Src) 36.3 C (Oral)  Resp 16  Ht 5\' 6"  (1.676 m)  Wt 155 lb 8 oz (70.534 kg)  BMI 25.11 kg/m2  SpO2 97%  Post vital signs: Reviewed  Level of consciousness: sedated  Complications: No apparent anesthesia complications

## 2014-01-15 NOTE — Transfer of Care (Signed)
Immediate Anesthesia Transfer of Care Note  Patient: Cameron Atkins  Procedure(s) Performed: Procedure(s): CYSTOSCOPY , LEFT RETROGRADE PYELOGRAM, LEFT URETEROSCOPY, BASKET STONE EXTRACTION (Left) HOLMIUM LASER APPLICATION, LEFT URETER (Left) CYSTOSCOPY WITH RIGHT RETROGRADE PYELOGRAM (Right)  Patient Location: PACU  Anesthesia Type:General  Level of Consciousness: awake, alert  and oriented  Airway & Oxygen Therapy: Patient Spontanous Breathing and Patient connected to face mask oxygen  Post-op Assessment: Report given to PACU RN and Post -op Vital signs reviewed and stable  Post vital signs: Reviewed and stable  Complications: No apparent anesthesia complications

## 2014-01-15 NOTE — Op Note (Signed)
NAMEDONATHAN, BULLER NO.:  [id number]  MEDICAL RECORD NO.:  [id number]  LOCATION:                                 FACILITY:  PHYSICIAN:  Marshall Cork. Jeffie Pollock, M.D.    DATE OF BIRTH:  20-Nov-1945  DATE OF PROCEDURE:  01/15/2014 DATE OF DISCHARGE:                              OPERATIVE REPORT   PROCEDURES: 1. Cystoscopy with right retrograde pyelogram and interpretation. 2. Left ureteroscopic stone extraction with holmium laser lithotripsy     and stent placement.  PREOPERATIVE DIAGNOSIS:  Left distal ureteral stone and bladder stone with a history of right renal stone with some right flank pain.  POSTOPERATIVE DIAGNOSIS:  Left distal ureteral stone and right lower pole stone, bladder stone not found.  SURGEON:  Marshall Cork. Jeffie Pollock, MD  ANESTHESIA:  General.  SPECIMEN:  Stone fragments from the left ureter, which were given to the family to bring to the office.  ESTIMATED BLOOD LOSS:  Minimal.  DRAIN:  A 6-French 26-cm double-J stent on the left.  COMPLICATIONS:  None.  INDICATIONS:  Mr. Colpitts is a 67 year old white male with a history of stones who recently returned and was found to have a 12-mm left UVJ stone and an 8-mm right lower pole stone.  He had a history of a bladder stone as well, it was 8 mm in size, on recent KUB, although it was not seen on bladder ultrasound.  He was originally scheduled to undergo left ureteroscopic stone extraction and removal of the bladder stone, but this morning he stated some right flank pain, so right retrograde pyelogram was added to the consent to ensure that if the right renal stone was moving, we could manage that.  FINDINGS OF PROCEDURE:  He was given Cipro.  He was taken to the operating room where general anesthetic was induced.  She was placed in lithotomy position.  His perineum and genitalia were prepped with Betadine solution.  He was draped in usual sterile fashion.  Cystoscopy was performed using a 22-French  scope and 12-degree lens.  Examination revealed a normal urethra.  External sphincter was intact.  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  Examination of bladder revealed ureteral orifices in their normal anatomic position.  There was a right Hutch diverticulum, approximately 1.5 cm in size.  Thorough inspection of the bladder revealed no evidence of bladder stone.  He did have moderate trabeculation.  No mucosal lesions were noted.  The right ureteral orifice was cannulated with 5-French open-end catheter and contrast was instilled.  Right retrograde pyelogram revealed a normal ureter and intrarenal collecting system with the exception of an 8-mm stone in the right lower pole.  After completion of right retrograde pyelogram, the left ureter was cannulated and contrast was instilled.  Left retrograde pyelogram revealed a filling defect in the distal ureter, consistent with a stone, but no proximal dilation.  A guidewire was then passed to the kidney and a 12/14 introducer sheath was then passed over the wire, first the core and then the assembled dilator to dilate the distal ureter.  A semi-rigid short ureteroscope was then passed  alongside the wire and the stone was visualized.  An initial attempt to remove the stone intact with an NGage basket was unsuccessful due to the size of the stone, so the stone was released.  A 365 micron laser fiber was then passed with the power set on 1 and the frequency on 20 hertz and the stone was engaged with the laser and fragmented into smaller pieces.  These pieces were then removed with a combination of NGage and a ZeroTip basket.  An additional lasering was required for one of the larger fragments. Once all fragments had been removed, it was felt that a stent needed to be placed because of some mild trauma to the meatus, so the cystoscope was reinserted over the wire.   Additional stone fragments were evacuated from the bladder and then a 6-French 26-cm double-J stent with string was inserted to the kidney under fluoroscopic guidance.  The wire was removed, leaving good coil in the kidney and a good coil in the bladder. The bladder was drained and the cystoscope was removed leaving the stent string exiting the urethra that was subsequently secured to the patient's penis with pink tape.  He was taken down from lithotomy position.  His anesthetic was reversed.  He was moved to the recovery room in stable condition.  There were no complications.  The stone fragments were given to the family to bring to the office for analysis.     Marshall Cork. Jeffie Pollock, M.D.     JJW/MEDQ  D:  01/15/2014  T:  01/15/2014  Job:  [id number]

## 2014-01-15 NOTE — Anesthesia Preprocedure Evaluation (Addendum)
Anesthesia Evaluation  Patient identified by MRN, date of birth, ID band Patient awake    Reviewed: Allergy & Precautions, H&P , NPO status , Patient's Chart, lab work & pertinent test results  Airway Mallampati: II TM Distance: >3 FB     Dental  (+) Dental Advisory Given   Pulmonary former smoker,  breath sounds clear to auscultation        Cardiovascular hypertension, Pt. on medications Rhythm:Regular Rate:Normal     Neuro/Psych PSYCHIATRIC DISORDERS Anxiety Depression negative neurological ROS     GI/Hepatic Neg liver ROS, hiatal hernia, GERD-  Medicated,  Endo/Other  negative endocrine ROS  Renal/GU negative Renal ROS     Musculoskeletal negative musculoskeletal ROS (+)   Abdominal   Peds  Hematology negative hematology ROS (+)   Anesthesia Other Findings   Reproductive/Obstetrics                          Anesthesia Physical Anesthesia Plan  ASA: II  Anesthesia Plan: General   Post-op Pain Management:    Induction: Intravenous  Airway Management Planned: LMA  Additional Equipment:   Intra-op Plan:   Post-operative Plan: Extubation in OR  Informed Consent: I have reviewed the patients History and Physical, chart, labs and discussed the procedure including the risks, benefits and alternatives for the proposed anesthesia with the patient or authorized representative who has indicated his/her understanding and acceptance.   Dental advisory given  Plan Discussed with: CRNA  Anesthesia Plan Comments:        Anesthesia Quick Evaluation

## 2014-01-15 NOTE — Discharge Instructions (Addendum)
Ureteral Stent Implantation, Care After Refer to this sheet in the next few weeks. These instructions provide you with information on caring for yourself after your procedure. Your health care provider may also give you more specific instructions. Your treatment has been planned according to current medical practices, but problems sometimes occur. Call your health care provider if you have any problems or questions after your procedure. WHAT TO EXPECT AFTER THE PROCEDURE You should be back to normal activity within 48 hours after the procedure. Nausea and vomiting may occur and are commonly the result of anesthesia. It is common to experience sharp pain in the back or lower abdomen and penis with voiding. This is caused by movement of the ends of the stent with the act of urinating.It usually goes away within minutes after you have stopped urinating. HOME CARE INSTRUCTIONS Make sure to drink plenty of fluids. You may have small amounts of bleeding, causing your urine to be red. This is normal. Certain movements may trigger pain or a feeling that you need to urinate. You may be given medications to prevent infection or bladder spasms. Be sure to take all medications as directed. Only take over-the-counter or prescription medicines for pain, discomfort, or fever as directed by your health care provider. Do not take aspirin, as this can make bleeding worse. Your stent will be left in until the blockage is resolved. This may take 2 weeks or longer, depending on the reason for stent implantation. You may have an X-ray exam to make sure your ureter is open and that the stent has not moved out of position (migrated). The stent can be removed by your health care provider in the office. Medications may be given for comfort while the stent is being removed. Be sure to keep all follow-up appointments so your health care provider can check that you are healing properly. SEEK MEDICAL CARE IF:  You experience  increasing pain.  Your pain medication is not working. SEEK IMMEDIATE MEDICAL CARE IF:  Your urine is dark red or has blood clots.  You are leaking urine (incontinent).  You have a fever, chills, feeling sick to your stomach (nausea), or vomiting.  Your pain is not relieved by pain medication.  The end of the stent comes out of the urethra.  You are unable to urinate. Document Released: 05/16/2013 Document Reviewed: 05/16/2013 [facility] Patient Information 2014 [place].  You may pull the stent by pulling the attached string on Friday morning.  If you don't feel comfortable with that, you can come to the office to have it done.   Please call if you need to come in.

## 2014-01-15 NOTE — Brief Op Note (Signed)
01/15/2014  10:41 AM  PATIENT:  [name]  68 y.o. male  PRE-OPERATIVE DIAGNOSIS:  BLADDER STONE, right renal stone AND LEFT URETERAL STONE   POST-OPERATIVE DIAGNOSIS:  Left ureteral stone and right renal stone  PROCEDURE:  Procedure(s): CYSTOSCOPY , LEFT RETROGRADE PYELOGRAM, LEFT URETEROSCOPY, BASKET STONE EXTRACTION (Left) HOLMIUM LASER APPLICATION, LEFT URETER (Left) CYSTOSCOPY WITH RIGHT RETROGRADE PYELOGRAM (Right)  SURGEON:  Surgeon(s) and Role:    * Irine Seal, MD - Primary  PHYSICIAN ASSISTANT:   ASSISTANTS: none   ANESTHESIA:   general  EBL:     BLOOD ADMINISTERED:none  DRAINS: 6 x 26 left JJ stent   LOCAL MEDICATIONS USED:  NONE  SPECIMEN:  Source of Specimen:  left ureteral stone  DISPOSITION OF SPECIMEN:  to family to bring to office  COUNTS:  YES  TOURNIQUET:  * No tourniquets in log *  DICTATION: .Other Dictation: Dictation Number [id number]  PLAN OF CARE: Discharge to home after PACU  PATIENT DISPOSITION:  PACU - hemodynamically stable.   Delay start of Pharmacological VTE agent (>24hrs) due to surgical blood loss or risk of bleeding: not applicable

## 2014-01-16 ENCOUNTER — Emergency Department (HOSPITAL_COMMUNITY)
Admission: EM | Admit: 2014-01-16 | Discharge: 2014-01-16 | Disposition: A | Discharge status: Home / Self Care | Payer: Medicare Other | Attending: Emergency Medicine | Admitting: Emergency Medicine

## 2014-01-16 ENCOUNTER — Emergency Department (HOSPITAL_COMMUNITY): Payer: Medicare Other

## 2014-01-16 ENCOUNTER — Encounter (HOSPITAL_BASED_OUTPATIENT_CLINIC_OR_DEPARTMENT_OTHER): Payer: Self-pay | Admitting: Urology

## 2014-01-16 DIAGNOSIS — K219 Gastro-esophageal reflux disease without esophagitis: Secondary | ICD-10-CM | POA: Insufficient documentation

## 2014-01-16 DIAGNOSIS — Z79899 Other long term (current) drug therapy: Secondary | ICD-10-CM | POA: Insufficient documentation

## 2014-01-16 DIAGNOSIS — F411 Generalized anxiety disorder: Secondary | ICD-10-CM | POA: Insufficient documentation

## 2014-01-16 DIAGNOSIS — I10 Essential (primary) hypertension: Secondary | ICD-10-CM | POA: Insufficient documentation

## 2014-01-16 DIAGNOSIS — N390 Urinary tract infection, site not specified: Secondary | ICD-10-CM | POA: Insufficient documentation

## 2014-01-16 DIAGNOSIS — Z9889 Other specified postprocedural states: Secondary | ICD-10-CM | POA: Insufficient documentation

## 2014-01-16 DIAGNOSIS — Z87891 Personal history of nicotine dependence: Secondary | ICD-10-CM | POA: Insufficient documentation

## 2014-01-16 DIAGNOSIS — M129 Arthropathy, unspecified: Secondary | ICD-10-CM | POA: Insufficient documentation

## 2014-01-16 LAB — URINALYSIS, ROUTINE W REFLEX MICROSCOPIC
BILIRUBIN URINE: NEGATIVE
Glucose, UA: NEGATIVE mg/dL
KETONES UR: NEGATIVE mg/dL
NITRITE: POSITIVE — AB
Protein, ur: 30 mg/dL — AB
Specific Gravity, Urine: <1.005 — ABNORMAL LOW (ref 1.005–1.030)
Urobilinogen, UA: 0.2 mg/dL (ref 0.0–1.0)
pH: 6.5 (ref 5.0–8.0)

## 2014-01-16 LAB — URINE MICROSCOPIC-ADD ON

## 2014-01-16 MED ORDER — CIPROFLOXACIN HCL 500 MG PO TABS: 500.0000 mg | ORAL_TABLET | Freq: Two times a day (BID) | ORAL | Status: DC

## 2014-01-16 MED ORDER — CIPROFLOXACIN IN D5W 400 MG/200ML IV SOLN
400.0000 mg | Freq: Once | INTRAVENOUS | Status: AC
Start: 2014-01-16 — End: 2014-01-16
  Administered 2014-01-16: 400 mg via INTRAVENOUS
  Filled 2014-01-16: qty 200

## 2014-01-16 MED ORDER — ONDANSETRON HCL 4 MG/2ML IJ SOLN
4.0000 mg | Freq: Once | INTRAMUSCULAR | Status: AC
  Administered 2014-01-16: 4 mg via INTRAVENOUS
  Filled 2014-01-16: qty 2

## 2014-01-16 MED ORDER — HYDROCODONE-ACETAMINOPHEN 5-325 MG PO TABS: 1.0000 | ORAL_TABLET | Freq: Four times a day (QID) | ORAL | Status: DC | PRN

## 2014-01-16 MED ORDER — SODIUM CHLORIDE 0.9 % IV BOLUS (SEPSIS)
1000.0000 mL | Freq: Once | INTRAVENOUS | Status: AC
  Administered 2014-01-16: 1000 mL via INTRAVENOUS

## 2014-01-16 MED ORDER — LORAZEPAM 1 MG PO TABS: 1.0000 mg | ORAL_TABLET | Freq: Once | ORAL | Status: DC

## 2014-01-16 MED ORDER — LORAZEPAM 2 MG/ML IJ SOLN: 1.0000 mg | Freq: Once | INTRAMUSCULAR | Status: DC

## 2014-01-16 MED ORDER — HYDROMORPHONE HCL PF 1 MG/ML IJ SOLN
1.0000 mg | Freq: Once | INTRAMUSCULAR | Status: AC
  Administered 2014-01-16: 1 mg via INTRAVENOUS
  Filled 2014-01-16: qty 1

## 2014-01-16 NOTE — ED Provider Notes (Signed)
CSN: [id number]     Arrival date & time 01/16/14  1048 History   This chart was scribed for Carmin Muskrat, MD by Era Bumpers, ED scribe. This patient was seen in room APA18/AP[facility]18 and the patient's care was started at 1048.  Chief Complaint  Patient presents with  . Post-op Problem   The history is provided by the patient. No language interpreter was used.   HPI Comments: Cameron Atkins is a 69 y.o. male who presents to the Emergency Department for constant, severe left flank, penis and suprapubic pain after having a procedure yesterday to remove a kidney stone. Yesterday he had a right cystoscopy and left ureteroscopic stone extraction via lithotripsy and stent placement. He had a distal ureteral stone and bladder stone with h/o right renal stone w/some right flank pain. Today he states that after his procedure he has been using pain medicine and pyridium w/no relief for his pain. He reports that he has been very uncomfortable and was unable to sleep last PM due to the pain. He reports pain worse w/voiding. The procedure was performed at [facility]. He reports decreased appetite but denies nausea or emesis episodes. He denies any CP, fever, emesis. He is going to f/u on Friday to have the stent removed.  No other medical problems   Past Medical History  Diagnosis Date  . Hypertension     for 5 yrs  . Depression   . GERD (gastroesophageal reflux disease)   . Anxiety   . Left ureteral calculus   . Bladder stone   . H/O hiatal hernia   . History of esophageal dilatation     STRICTURE--  LAST DONE 03/2013  . Helicobacter pylori gastritis   . History of colonoscopy with polypectomy     TUBULAR ADENOMA-- 2012  . Diverticulosis of colon   . Arthritis   . Wears glasses    Past Surgical History  Procedure Laterality Date  . Esophagogastroduodenoscopy N/A 03/21/2013    Procedure: ESOPHAGOGASTRODUODENOSCOPY (EGD);  Surgeon: Danie Binder, MD;  [facility];  Service: Endoscopy;   Laterality: N/A;  . Esophageal dilation  03/21/2013    Procedure: ESOPHAGEAL DILATION;  Surgeon: Danie Binder, MD;  [facility];  Service: Endoscopy;;  . Esophagogastroduodenoscopy (egd) with esophageal dilation N/A 04/26/2013    Procedure: ESOPHAGOGASTRODUODENOSCOPY (EGD) WITH ESOPHAGEAL DILATION;  Surgeon: Rogene Houston, MD;  [facility];  Service: Endoscopy;  Laterality: N/A;  130  . Extracorporeal shock wave lithotripsy  X2   YRS AGO  . Shoulder arthroscopy w/ subacromial decompression and distal clavicle excision Left 2000  . Cysto/  ureteroscopic lithotripsy/ stone extraction  1995  . Cystoscopy with retrograde pyelogram, ureteroscopy and stent placement Left 01/15/2014    Procedure: CYSTOSCOPY , LEFT RETROGRADE PYELOGRAM, LEFT URETEROSCOPY, BASKET STONE EXTRACTION;  Surgeon: Irine Seal, MD;  Location: [facility];  Service: Urology;  Laterality: Left;  . Holmium laser application Left 1/44/3154    Procedure: HOLMIUM LASER APPLICATION, LEFT URETER;  Surgeon: Irine Seal, MD;  Location: [facility];  Service: Urology;  Laterality: Left;  . Cystoscopy w/ retrogrades Right 01/15/2014    Procedure: CYSTOSCOPY WITH RIGHT RETROGRADE PYELOGRAM;  Surgeon: Irine Seal, MD;  Location: [facility];  Service: Urology;  Laterality: Right;   History reviewed. No pertinent family history. History  Substance Use Topics  . Smoking status: Former Smoker -- 0.25 packs/day for 2 years    Types: Cigarettes  Quit date: 01/14/1969  . Smokeless tobacco: Former Systems developer    Types: Chew    Quit date: 01/15/1999  . Alcohol Use: 12.6 oz/week    21 Cans of beer per week     Comment: 2 to 3 beer daily    Review of Systems  Constitutional: Negative for fever and chills.  Respiratory: Negative for cough and shortness of breath.   Cardiovascular: Negative for chest pain.  Gastrointestinal: Negative for nausea and vomiting.  Genitourinary:  Positive for dysuria and flank pain.  Musculoskeletal: Negative for back pain.  Skin: Negative for rash.  All other systems reviewed and are negative.   Allergies  Benadryl and Mepergan  Home Medications   Prior to Admission medications   Medication Sig Start Date End Date Taking? Authorizing Provider  b complex vitamins tablet Take 1 tablet by mouth daily.    Historical Provider, MD  calcium citrate-vitamin D (CITRACAL+D) 315-200 MG-UNIT per tablet Take 1 tablet by mouth 2 (two) times daily.    Historical Provider, MD  Cholecalciferol (VITAMIN D3) 2000 UNITS TABS Take 1 tablet by mouth daily.    Historical Provider, MD  cimetidine (TAGAMET) 400 MG tablet Take 1 tablet (400 mg total) by mouth daily as needed. 06/26/13   Rogene Houston, MD  fish oil-omega-3 fatty acids 1000 MG capsule Take 2 g by mouth daily.      Historical Provider, MD  gabapentin (NEURONTIN) 600 MG tablet Take 600 mg by mouth 3 (three) times daily.      Historical Provider, MD  hydrochlorothiazide (HYDRODIURIL) 25 MG tablet Take 25 mg by mouth every morning.     Historical Provider, MD  lisinopril (PRINIVIL,ZESTRIL) 10 MG tablet Take 10 mg by mouth every morning.     Historical Provider, MD  LORazepam (ATIVAN) 1 MG tablet Take 1 mg by mouth 4 (four) times daily - after meals and at bedtime.      Historical Provider, MD  Multiple Vitamin (MULTIVITAMIN) tablet Take 1 tablet by mouth daily.    Historical Provider, MD  oxyCODONE-acetaminophen (ROXICET) 5-325 MG per tablet Take 1 tablet by mouth every 4 (four) hours as needed for severe pain. 01/15/14   Irine Seal, MD  pantoprazole (PROTONIX) 40 MG tablet Take 40 mg by mouth every morning. 04/09/13   Butch Penny, NP  phenazopyridine (PYRIDIUM) 200 MG tablet Take 1 tablet (200 mg total) by mouth 3 (three) times daily as needed for pain. 01/15/14   Irine Seal, MD   Triage Vitals: BP 157/108  Pulse 76  Temp(Src) 97.7 F (36.5 C) (Oral)  Resp 24  SpO2 98%  Physical Exam   Nursing note and vitals reviewed. Constitutional: He is oriented to person, place, and time. He appears well-developed and well-nourished. No distress.  HENT:  Head: Normocephalic and atraumatic.  Eyes: Conjunctivae are normal. Right eye exhibits no discharge. Left eye exhibits no discharge.  Neck: Normal range of motion.  Cardiovascular: Normal rate.   Pulmonary/Chest: Effort normal. No respiratory distress.  Genitourinary:  String attached to stent  Musculoskeletal: Normal range of motion. He exhibits no edema.  Neurological: He is alert and oriented to person, place, and time.  Skin: Skin is warm and dry.  Psychiatric: He has a normal mood and affect. Thought content normal.    ED Course  Procedures (including critical care time) DIAGNOSTIC STUDIES: Oxygen Saturation is 98% on room air, normal by my interpretation.    COORDINATION OF CARE: At 1110 AM Discussed treatment plan with patient  which includes UA, IV fluids, pain/nausea medicine, abdominal X-ray. Patient agrees.   Labs Review Labs Reviewed  URINALYSIS, ROUTINE W REFLEX MICROSCOPIC - Abnormal; Notable for the following:    APPearance HAZY (*)    Specific Gravity, Urine <1.005 (*)    Hgb urine dipstick LARGE (*)    Protein, ur 30 (*)    Nitrite POSITIVE (*)    Leukocytes, UA SMALL (*)    All other components within normal limits  URINE MICROSCOPIC-ADD ON   Imaging Review Dg Abd 1 View  01/16/2014   CLINICAL DATA:  Left side abdominal pain, left ureteral stent placed 1 day ago, history hypertension, kidney stones  EXAM: ABDOMEN - 1 VIEW  COMPARISON:  None  FINDINGS: Left ureteral stent extends from the expected position of the upper pole position of the left kidney to the urinary bladder.  Calculi at lower pole left kidney largest 7 x 5 mm.  Calculus projects over the right kidney 9 x 7 mm.  Calcification projects over the inferior pelvis, question phlebolith though a 7 x 7 mm diameter calculus is not completely  excluded.  Bowel gas pattern normal.  No acute osseous findings.  IMPRESSION: Left ureteral stent extends from expected position of the upper pole left kidney to the urinary bladder.  Bilateral renal calculi, with question calculus versus phlebolith in pelvis.   Electronically Signed   By: Lavonia Dana M.D.   On: 01/16/2014 12:21     12:02 PM Patient much more comfortable.  2:25 PM With evidence of urinary tract infection patient will receive ciprofloxacin. I discussed his case with our urology team.  Patient has next followup with them if he remains well here.  MDM    This patient presents one day after ureteral stent placement, lithotripsy, now with chills, pain. Patient is hemodynamically stable.  Patient's evaluation demonstrates urinary tract infection.  Patient clinical improvement, the absence of distress, and absence of evidence of bacteremia/sepsis makes him appropriate for further evaluation as an outpatient after antibiotics were started here, and his pain was controlled.  I personally performed the services described in this documentation, which was scribed in my presence. The recorded information has been reviewed and is accurate.      Carmin Muskrat, MD 01/16/14 1426

## 2014-01-16 NOTE — Discharge Instructions (Signed)
As discussed, it is important that you follow up tomorrow with your physician for continued management of your condition. ° °If you develop any new, or concerning changes in your condition, please return to the emergency department immediately. ° °

## 2014-01-16 NOTE — ED Notes (Signed)
Pt had kidney stone removed and stent placed yesterday. Pt reports pain in penis and lower abdomen.

## 2014-01-18 ENCOUNTER — Telehealth (INDEPENDENT_AMBULATORY_CARE_PROVIDER_SITE_OTHER): Payer: Self-pay | Admitting: Internal Medicine

## 2014-01-18 ENCOUNTER — Encounter (HOSPITAL_COMMUNITY): Payer: Self-pay | Admitting: Emergency Medicine

## 2014-01-18 ENCOUNTER — Emergency Department (HOSPITAL_COMMUNITY)
Admission: EM | Admit: 2014-01-18 | Discharge: 2014-01-18 | Disposition: A | Discharge status: Home / Self Care | Payer: Medicare Other | Attending: Emergency Medicine | Admitting: Emergency Medicine

## 2014-01-18 DIAGNOSIS — R319 Hematuria, unspecified: Secondary | ICD-10-CM | POA: Insufficient documentation

## 2014-01-18 DIAGNOSIS — Z87442 Personal history of urinary calculi: Secondary | ICD-10-CM | POA: Insufficient documentation

## 2014-01-18 DIAGNOSIS — Z8619 Personal history of other infectious and parasitic diseases: Secondary | ICD-10-CM | POA: Insufficient documentation

## 2014-01-18 DIAGNOSIS — K219 Gastro-esophageal reflux disease without esophagitis: Secondary | ICD-10-CM | POA: Insufficient documentation

## 2014-01-18 DIAGNOSIS — R109 Unspecified abdominal pain: Secondary | ICD-10-CM | POA: Insufficient documentation

## 2014-01-18 DIAGNOSIS — F411 Generalized anxiety disorder: Secondary | ICD-10-CM | POA: Insufficient documentation

## 2014-01-18 DIAGNOSIS — F329 Major depressive disorder, single episode, unspecified: Secondary | ICD-10-CM | POA: Insufficient documentation

## 2014-01-18 DIAGNOSIS — Z860100 Personal history of colon polyps, unspecified: Secondary | ICD-10-CM | POA: Insufficient documentation

## 2014-01-18 DIAGNOSIS — F3289 Other specified depressive episodes: Secondary | ICD-10-CM | POA: Insufficient documentation

## 2014-01-18 DIAGNOSIS — Z79899 Other long term (current) drug therapy: Secondary | ICD-10-CM | POA: Insufficient documentation

## 2014-01-18 DIAGNOSIS — I10 Essential (primary) hypertension: Secondary | ICD-10-CM | POA: Insufficient documentation

## 2014-01-18 DIAGNOSIS — M129 Arthropathy, unspecified: Secondary | ICD-10-CM | POA: Insufficient documentation

## 2014-01-18 DIAGNOSIS — Z789 Other specified health status: Secondary | ICD-10-CM | POA: Insufficient documentation

## 2014-01-18 DIAGNOSIS — Z9889 Other specified postprocedural states: Secondary | ICD-10-CM | POA: Insufficient documentation

## 2014-01-18 DIAGNOSIS — Z87891 Personal history of nicotine dependence: Secondary | ICD-10-CM | POA: Insufficient documentation

## 2014-01-18 DIAGNOSIS — Z792 Long term (current) use of antibiotics: Secondary | ICD-10-CM | POA: Insufficient documentation

## 2014-01-18 LAB — URINALYSIS, ROUTINE W REFLEX MICROSCOPIC
Glucose, UA: NEGATIVE mg/dL
KETONES UR: 15 mg/dL — AB
NITRITE: POSITIVE — AB
PH: 5.5 (ref 5.0–8.0)
Protein, ur: 100 mg/dL — AB
Specific Gravity, Urine: 1.017 (ref 1.005–1.030)
Urobilinogen, UA: 1 mg/dL (ref 0.0–1.0)

## 2014-01-18 LAB — URINE MICROSCOPIC-ADD ON

## 2014-01-18 MED ORDER — SODIUM CHLORIDE 0.9 % IV BOLUS (SEPSIS)
1000.0000 mL | Freq: Once | INTRAVENOUS | Status: DC
Start: 2014-01-18 — End: 2014-01-18

## 2014-01-18 MED ORDER — SODIUM CHLORIDE 0.9 % IV BOLUS (SEPSIS)
1000.0000 mL | Freq: Once | INTRAVENOUS | Status: AC
  Administered 2014-01-18: 1000 mL via INTRAVENOUS

## 2014-01-18 MED ORDER — PANTOPRAZOLE SODIUM 40 MG PO TBEC: 40.0000 mg | DELAYED_RELEASE_TABLET | Freq: Every morning | ORAL | Status: DC

## 2014-01-18 NOTE — ED Provider Notes (Addendum)
CSN: [id number]     Arrival date & time 01/18/14  0254 History   First MD Initiated Contact with Patient 01/18/14 0309     Chief Complaint  Patient presents with  . Decreased Urine Output      (Consider location/radiation/quality/duration/timing/severity/associated sxs/prior Treatment) HPI This is a 68 year old male who recently had a stent placed after removal of a left ureteral stone. The stent was removed in his urologists office yesterday. Since that time he has had the sensation that despite drinking a lot of fluid he is not producing much urine. He does not feel like he is obstructed. He was having left flank pain earlier which was relieved with his prescribed analgesic. He denies bladder pain at this time. He has not had a fever or chills, but has felt cold throughout the day yesterday. He denies nausea or vomiting.  Past Medical History  Diagnosis Date  . Hypertension     for 5 yrs  . Depression   . GERD (gastroesophageal reflux disease)   . Anxiety   . Left ureteral calculus   . Bladder stone   . H/O hiatal hernia   . History of esophageal dilatation     STRICTURE--  LAST DONE 03/2013  . Helicobacter pylori gastritis   . History of colonoscopy with polypectomy     TUBULAR ADENOMA-- 2012  . Diverticulosis of colon   . Arthritis   . Wears glasses    Past Surgical History  Procedure Laterality Date  . Esophagogastroduodenoscopy N/A 03/21/2013    Procedure: ESOPHAGOGASTRODUODENOSCOPY (EGD);  Surgeon: Danie Binder, MD;  [facility];  Service: Endoscopy;  Laterality: N/A;  . Esophageal dilation  03/21/2013    Procedure: ESOPHAGEAL DILATION;  Surgeon: Danie Binder, MD;  [facility];  Service: Endoscopy;;  . Esophagogastroduodenoscopy (egd) with esophageal dilation N/A 04/26/2013    Procedure: ESOPHAGOGASTRODUODENOSCOPY (EGD) WITH ESOPHAGEAL DILATION;  Surgeon: Rogene Houston, MD;  [facility];  Service: Endoscopy;  Laterality: N/A;  130   . Extracorporeal shock wave lithotripsy  X2   YRS AGO  . Shoulder arthroscopy w/ subacromial decompression and distal clavicle excision Left 2000  . Cysto/  ureteroscopic lithotripsy/ stone extraction  1995  . Cystoscopy with retrograde pyelogram, ureteroscopy and stent placement Left 01/15/2014    Procedure: CYSTOSCOPY , LEFT RETROGRADE PYELOGRAM, LEFT URETEROSCOPY, BASKET STONE EXTRACTION;  Surgeon: Irine Seal, MD;  Location: [facility];  Service: Urology;  Laterality: Left;  . Holmium laser application Left 03/22/9484    Procedure: HOLMIUM LASER APPLICATION, LEFT URETER;  Surgeon: Irine Seal, MD;  Location: [facility];  Service: Urology;  Laterality: Left;  . Cystoscopy w/ retrogrades Right 01/15/2014    Procedure: CYSTOSCOPY WITH RIGHT RETROGRADE PYELOGRAM;  Surgeon: Irine Seal, MD;  Location: [facility];  Service: Urology;  Laterality: Right;   History reviewed. No pertinent family history. History  Substance Use Topics  . Smoking status: Former Smoker -- 0.25 packs/day for 2 years    Types: Cigarettes    Quit date: 01/14/1969  . Smokeless tobacco: Former Systems developer    Types: Chew    Quit date: 01/15/1999  . Alcohol Use: 12.6 oz/week    21 Cans of beer per week     Comment: 2 to 3 beer daily    Review of Systems  All other systems reviewed and are negative.   Allergies  Benadryl; Mepergan; and Oxycodone  Home Medications   Prior to Admission medications  Medication Sig Start Date End Date Taking? Authorizing Provider  b complex vitamins tablet Take 1 tablet by mouth daily.    Historical Provider, MD  calcium citrate-vitamin D (CITRACAL+D) 315-200 MG-UNIT per tablet Take 1 tablet by mouth 2 (two) times daily.    Historical Provider, MD  Cholecalciferol (VITAMIN D3) 2000 UNITS TABS Take 1 tablet by mouth daily.    Historical Provider, MD  cimetidine (TAGAMET) 400 MG tablet Take 1 tablet (400 mg total) by mouth daily as needed. 06/26/13    Rogene Houston, MD  ciprofloxacin (CIPRO) 500 MG tablet Take 1 tablet (500 mg total) by mouth 2 (two) times daily. 01/16/14   Carmin Muskrat, MD  fish oil-omega-3 fatty acids 1000 MG capsule Take 2 g by mouth daily.      Historical Provider, MD  gabapentin (NEURONTIN) 600 MG tablet Take 600 mg by mouth 3 (three) times daily.      Historical Provider, MD  hydrochlorothiazide (HYDRODIURIL) 25 MG tablet Take 25 mg by mouth every morning.     Historical Provider, MD  HYDROcodone-acetaminophen (NORCO/VICODIN) 5-325 MG per tablet Take 1 tablet by mouth every 6 (six) hours as needed. 01/16/14   Carmin Muskrat, MD  lisinopril (PRINIVIL,ZESTRIL) 10 MG tablet Take 10 mg by mouth every morning.     Historical Provider, MD  LORazepam (ATIVAN) 1 MG tablet Take 1 mg by mouth 4 (four) times daily - after meals and at bedtime.      Historical Provider, MD  Multiple Vitamin (MULTIVITAMIN) tablet Take 1 tablet by mouth daily.    Historical Provider, MD  pantoprazole (PROTONIX) 40 MG tablet Take 40 mg by mouth every morning. 04/09/13   Butch Penny, NP  phenazopyridine (PYRIDIUM) 200 MG tablet Take 1 tablet (200 mg total) by mouth 3 (three) times daily as needed for pain. 01/15/14   Irine Seal, MD   BP 169/99  Pulse 92  Temp(Src) 98.7 F (37.1 C) (Oral)  Resp 20  Ht 5\' 6"  (1.676 m)  Wt 156 lb (70.761 kg)  BMI 25.19 kg/m2  SpO2 98%  Physical Exam General: Well-developed, well-nourished male in no acute distress; appearance consistent with age of record HENT: normocephalic; atraumatic Eyes: pupils equal, round and reactive to light; extraocular muscles intact Neck: supple Heart: regular rate and rhythm Lungs: clear to auscultation bilaterally Abdomen: soft; nondistended; nontender; bowel sounds present GU: no CVA tenderness Extremities: No deformity; full range of motion; pulses normal Neurologic: Awake, alert and oriented; motor function intact in all extremities and symmetric; no facial droop Skin:  Warm and dry Psychiatric: Normal mood and affect    ED Course  Procedures (including critical care time)   MDM  3:46 AM 100 mL of grossly bloody urine obtained after placement of Foley catheter. This is not consistent with acute urinary retention. We will give him some IV fluids as he states he has not had good oral intake the last several days.  Nursing notes and vitals signs, including pulse oximetry, reviewed.  Summary of this visit's results, reviewed by myself:  Labs:  Results for orders placed during the hospital encounter of 01/18/14 (from the past 24 hour(s))  URINALYSIS, ROUTINE W REFLEX MICROSCOPIC     Status: Abnormal   Collection Time    01/18/14  3:29 AM      Result Value Ref Range   Color, Urine RED (*) YELLOW   APPearance TURBID (*) CLEAR   Specific Gravity, Urine 1.017  1.005 - 1.030   pH 5.5  5.0 - 8.0   Glucose, UA NEGATIVE  NEGATIVE mg/dL   Hgb urine dipstick LARGE (*) NEGATIVE   Bilirubin Urine LARGE (*) NEGATIVE   Ketones, ur 15 (*) NEGATIVE mg/dL   Protein, ur 100 (*) NEGATIVE mg/dL   Urobilinogen, UA 1.0  0.0 - 1.0 mg/dL   Nitrite POSITIVE (*) NEGATIVE   Leukocytes, UA MODERATE (*) NEGATIVE  URINE MICROSCOPIC-ADD ON     Status: Abnormal   Collection Time    01/18/14  3:29 AM      Result Value Ref Range   Squamous Epithelial / LPF RARE  RARE   WBC, UA 21-50  <3 WBC/hpf   RBC / HPF FIELD OBSCURED BY RBC'S  <3 RBC/hpf   Bacteria, UA MANY (*) RARE   4:30 AM Patient's urine has cleared following IV fluid bolus. He continues to be on ciprofloxacin. No culture was obtained on previous specimen. We have sent urine for culture as UA is suggestive of urinary tract infection.    Wynetta Fines, MD 01/18/14 [place], MD 01/18/14 [phone]

## 2014-01-18 NOTE — Telephone Encounter (Signed)
Rx for Protonix sent to [facility] via [employer]

## 2014-01-18 NOTE — ED Notes (Signed)
Discharge instructions reviewed several times with patient and pt's wife Patient informed that urine culture has been sent off and that patient needs to continue to take the Cipro unless made aware that another abx needs to be given--agreed and v/u Patient expressed concern r/t pain Patient's wife in possession of Rx for hydrocodone and also informed this nurse that they were informed to take "ibuprofen or Aleve in between." Patient's wife stated that they did receive discharge instructions s/p urology procedure, but that patient is "hard headed" r/t following discharge instructions Patient admits that he "skipped" a few doses of Cipro for various reasons such as needing a snack, time of day/night Patient informed multiple times that he needs to follow post procedure and DC instructions

## 2014-01-18 NOTE — ED Notes (Signed)
Pt had a kidney stone removed on Tuesday and the stent removed yesterday about noon, pt has not been able to void since then except little dribbles.

## 2014-01-18 NOTE — ED Notes (Signed)
MD at bedside. 

## 2014-01-19 LAB — URINE CULTURE
CULTURE: NO GROWTH
Colony Count: NO GROWTH

## 2014-06-26 ENCOUNTER — Ambulatory Visit (INDEPENDENT_AMBULATORY_CARE_PROVIDER_SITE_OTHER): Payer: Medicare Other | Admitting: Internal Medicine

## 2014-06-26 ENCOUNTER — Encounter (INDEPENDENT_AMBULATORY_CARE_PROVIDER_SITE_OTHER): Payer: Self-pay | Admitting: Internal Medicine

## 2014-06-26 VITALS — BP 120/68 | HR 64 | Temp 97.0°F | Ht 66.0 in | Wt 153.8 lb

## 2014-06-26 DIAGNOSIS — K219 Gastro-esophageal reflux disease without esophagitis: Secondary | ICD-10-CM

## 2014-06-26 NOTE — Progress Notes (Signed)
Subjective:    Patient ID: Cameron Atkins, male    DOB: 1946-03-07, 68 y.o.   MRN: [id number]  HPI Here today for f/u of his chronic GERD. Hx of esophageal stricture.  Patient has chronic depression.  Patient ha hs of oste porous. Appetite is good. No weight loss. Acid reflux controlled with Protonix. He does not eat after 8pm to prevent acid reflux. Usually has a BM daily. No melena or BRRB.      He hurt in his epigastric region.  Appetite is not good. He can go all day without eating. No weight loss.  He doesn't eat after 7pm.  There is no dysphagia.  He usually has a BM daily.  Hx of depression  04/26/2013 EGD/ED: Solid food dysphagia: EGD/ED 5 weeks prior for foreign body inpaction/esophageal stricture by Dr. Oneida Alar.  Complications: None  Impression:  Healed esophagitis.  Stricture at GE junction was dilated with a balloon from 15-18 mm.  Small to moderate size sliding hiatal hernia.  Known H. pylori gastritis.   02/10/2011 EGD/ED, Colonoscopy:  FINAL DIAGNOSES:  1. 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 The  stricture was dilated to 18 mm with a balloon.  2. A 2-cm size sliding hiatal hernia.  3. Normal exam. of the stomach, first and second part of the duodenum.  4. Moderate left-sided diverticulosis.  5. A 3-mm polyp ablated via cold biopsy from the ascending colon.    Review of Systems     Past Medical History  Diagnosis Date  . Hypertension     for 5 yrs  . Depression   . GERD (gastroesophageal reflux disease)   . Anxiety   . Left ureteral calculus   . Bladder stone   . H/O hiatal hernia   . History of esophageal dilatation     STRICTURE--  LAST DONE 03/2013  . Helicobacter pylori gastritis   . History of colonoscopy with polypectomy     TUBULAR ADENOMA-- 2012  . Diverticulosis of colon   . Arthritis   . Wears glasses     Past Surgical History  Procedure Laterality Date  . Esophagogastroduodenoscopy N/A 03/21/2013    Procedure:  ESOPHAGOGASTRODUODENOSCOPY (EGD);  Surgeon: Danie Binder, MD;  [facility];  Service: Endoscopy;  Laterality: N/A;  . Esophageal dilation  03/21/2013    Procedure: ESOPHAGEAL DILATION;  Surgeon: Danie Binder, MD;  [facility];  Service: Endoscopy;;  . Esophagogastroduodenoscopy (egd) with esophageal dilation N/A 04/26/2013    Procedure: ESOPHAGOGASTRODUODENOSCOPY (EGD) WITH ESOPHAGEAL DILATION;  Surgeon: Rogene Houston, MD;  [facility];  Service: Endoscopy;  Laterality: N/A;  130  . Extracorporeal shock wave lithotripsy  X2   YRS AGO  . Shoulder arthroscopy w/ subacromial decompression and distal clavicle excision Left 2000  . Cysto/  ureteroscopic lithotripsy/ stone extraction  1995  . Cystoscopy with retrograde pyelogram, ureteroscopy and stent placement Left 01/15/2014    Procedure: CYSTOSCOPY , LEFT RETROGRADE PYELOGRAM, LEFT URETEROSCOPY, BASKET STONE EXTRACTION;  Surgeon: Irine Seal, MD;  Location: [facility];  Service: Urology;  Laterality: Left;  . Holmium laser application Left 4/31/5400    Procedure: HOLMIUM LASER APPLICATION, LEFT URETER;  Surgeon: Irine Seal, MD;  Location: [facility];  Service: Urology;  Laterality: Left;  . Cystoscopy w/ retrogrades Right 01/15/2014    Procedure: CYSTOSCOPY WITH RIGHT RETROGRADE PYELOGRAM;  Surgeon: Irine Seal, MD;  Location: [facility];  Service: Urology;  Laterality:  Right;    Allergies  Allergen Reactions  . Benadryl [Diphenhydramine] Other (See Comments)    anxious  . Mepergan [Meperidine-Promethazine]     Vomiting.   . Oxycodone Itching    hyperactivity    Current Outpatient Prescriptions on File Prior to Visit  Medication Sig Dispense Refill  . b complex vitamins tablet Take 1 tablet by mouth daily.      . calcium citrate-vitamin D (CITRACAL+D) 315-200 MG-UNIT per tablet Take 1 tablet by mouth 2 (two) times daily.      . Cholecalciferol (VITAMIN D3)  2000 UNITS TABS Take 1 tablet by mouth daily.      . cimetidine (TAGAMET) 400 MG tablet Take 1 tablet (400 mg total) by mouth daily as needed.  30 tablet  5  . fish oil-omega-3 fatty acids 1000 MG capsule Take 2 g by mouth daily.        Marland Kitchen gabapentin (NEURONTIN) 600 MG tablet Take 600 mg by mouth 3 (three) times daily.        Marland Kitchen HYDROcodone-acetaminophen (NORCO/VICODIN) 5-325 MG per tablet Take 1 tablet by mouth every 6 (six) hours as needed.  15 tablet  0  . lisinopril (PRINIVIL,ZESTRIL) 10 MG tablet Take 10 mg by mouth every morning.       Marland Kitchen LORazepam (ATIVAN) 1 MG tablet Take 1 mg by mouth 4 (four) times daily - after meals and at bedtime.        . Multiple Vitamin (MULTIVITAMIN) tablet Take 1 tablet by mouth daily.      . pantoprazole (PROTONIX) 40 MG tablet Take 1 tablet (40 mg total) by mouth every morning.  30 tablet  11   No current facility-administered medications on file prior to visit.     Objective:   Physical Exam  Filed Vitals:   06/26/14 1137  BP: 120/68  Pulse: 64  Temp: 97 F (36.1 C)  Height: 5\' 6"  (1.676 m)  Weight: 153 lb 12.8 oz (69.763 kg)   Alert and oriented. Skin warm and dry. Oral mucosa is moist.   . Sclera anicteric, conjunctivae is pink. Thyroid not enlarged. No cervical lymphadenopathy. Lungs clear. Heart regular rate and rhythm.  Abdomen is soft. Bowel sounds are positive. No hepatomegaly. No abdominal masses felt. No tenderness.  No edema to lower extremities.          Assessment & Plan:  GERD controlled at this time with Protonix. Patient also has incorporated lifestyle modifications.   Continue Protonix. OV 1 yr with Dr. Laural Golden.

## 2014-09-26 ENCOUNTER — Ambulatory Visit (INDEPENDENT_AMBULATORY_CARE_PROVIDER_SITE_OTHER): Payer: Medicare Other | Admitting: Otolaryngology

## 2014-09-26 DIAGNOSIS — J3501 Chronic tonsillitis: Secondary | ICD-10-CM

## 2014-09-26 DIAGNOSIS — J351 Hypertrophy of tonsils: Secondary | ICD-10-CM

## 2014-12-30 ENCOUNTER — Other Ambulatory Visit (INDEPENDENT_AMBULATORY_CARE_PROVIDER_SITE_OTHER): Payer: Self-pay | Admitting: Internal Medicine

## 2015-03-14 ENCOUNTER — Encounter (INDEPENDENT_AMBULATORY_CARE_PROVIDER_SITE_OTHER): Payer: Self-pay | Admitting: *Deleted

## 2015-07-22 ENCOUNTER — Encounter (INDEPENDENT_AMBULATORY_CARE_PROVIDER_SITE_OTHER): Payer: Self-pay | Admitting: Internal Medicine

## 2015-07-22 ENCOUNTER — Ambulatory Visit (INDEPENDENT_AMBULATORY_CARE_PROVIDER_SITE_OTHER): Payer: Medicare Other | Admitting: Internal Medicine

## 2015-07-22 VITALS — BP 120/68 | HR 76 | Temp 97.8°F | Resp 18 | Ht 66.0 in | Wt 152.1 lb

## 2015-07-22 DIAGNOSIS — Z86010 Personal history of colonic polyps: Secondary | ICD-10-CM

## 2015-07-22 DIAGNOSIS — K2100 Gastro-esophageal reflux disease with esophagitis, without bleeding: Secondary | ICD-10-CM

## 2015-07-22 DIAGNOSIS — K222 Esophageal obstruction: Secondary | ICD-10-CM | POA: Diagnosis not present

## 2015-07-22 NOTE — Patient Instructions (Signed)
Call if you experience swallowing difficulty

## 2015-07-22 NOTE — Progress Notes (Signed)
Presenting complaint;  Follow-up for GERD and esophageal stricture.  Subjective:  Patient is 69 year old Caucasian male was history of GERD complicated by sufficient stricture and is here for yearly visit. Esophageal stricture was dilated to 18 mm in July 2014. He was also noted to have healed esophagitis. He continues to feel well. On his last visit he was given Pylera for 10 days. He was able to take it for 7 days. He has occasional heartburn relieved with soda water. He denies dysphagia nausea vomiting abdominal pain melena or rectal bleeding. He is taking Tagamet primarily to potentiate the effect of lorazepam.  Current Medications: Outpatient Encounter Prescriptions as of 07/22/2015  Medication Sig  . calcium citrate-vitamin D (CITRACAL+D) 315-200 MG-UNIT per tablet Take 1 tablet by mouth 2 (two) times daily.  . Cholecalciferol (VITAMIN D3) 2000 UNITS TABS Take by mouth daily. 3000 units daily per patient.  . cimetidine (TAGAMET) 400 MG tablet Take 1 tablet (400 mg total) by mouth daily as needed.  . fish oil-omega-3 fatty acids 1000 MG capsule Take 2 g by mouth daily.    Marland Kitchen gabapentin (NEURONTIN) 600 MG tablet Take 600 mg by mouth 3 (three) times daily.    . hydrochlorothiazide (HYDRODIURIL) 25 MG tablet Take 25 mg by mouth daily.   Marland Kitchen lisinopril (PRINIVIL,ZESTRIL) 10 MG tablet Take 10 mg by mouth every morning.   Marland Kitchen LORazepam (ATIVAN) 1 MG tablet Take 1 mg by mouth 4 (four) times daily - after meals and at bedtime.    . pantoprazole (PROTONIX) 40 MG tablet TAKE 1 TABLET BY MOUTH EVERY MORNING.  . Multiple Vitamin (MULTIVITAMIN) tablet Take 1 tablet by mouth daily.  . [DISCONTINUED] b complex vitamins tablet Take 1 tablet by mouth daily.  . [DISCONTINUED] fluticasone (VERAMYST) 27.5 MCG/SPRAY nasal spray Place 2 sprays into the nose as needed.   . [DISCONTINUED] HYDROcodone-acetaminophen (NORCO/VICODIN) 5-325 MG per tablet Take 1 tablet by mouth every 6 (six) hours as needed. (Patient  not taking: Reported on 07/22/2015)   No facility-administered encounter medications on file as of 07/22/2015.     Objective: Blood pressure 120/68, pulse 76, temperature 97.8 F (36.6 C), temperature source Oral, resp. rate 18, height 5\' 6"  (1.676 m), weight 152 lb 1.6 oz (68.992 kg). Patient is alert and in no acute distress. Conjunctiva is pink. Sclera is nonicteric Oropharyngeal mucosa is normal. No neck masses or thyromegaly noted. Cardiac exam with regular rhythm normal S1 and S2. No murmur or gallop noted. Lungs are clear to auscultation. Abdomen is symmetrical soft and nontender without organomegaly or masses. No LE edema or clubbing noted.   Assessment:  #1. Esophageal stricture felt to be secondary to gastroesophageal reflux disease. This stricture was last dilated in July 2014 and he has had no problems. Stricture was dilated to 18 mm with a balloon. #2. GERD. Heartburns well controlled with therapy. #3. History of colonic adenomas. Next colonoscopy due in May 2017.   Plan:  Continue anti-reflux measures. Continue pantoprazole at 40 mg by mouth every morning. Office visit in one year. Patient will call office if he develops dysphagia.

## 2015-12-30 DIAGNOSIS — C44222 Squamous cell carcinoma of skin of right ear and external auricular canal: Secondary | ICD-10-CM | POA: Diagnosis not present

## 2016-01-07 DIAGNOSIS — D485 Neoplasm of uncertain behavior of skin: Secondary | ICD-10-CM | POA: Diagnosis not present

## 2016-01-07 DIAGNOSIS — C44222 Squamous cell carcinoma of skin of right ear and external auricular canal: Secondary | ICD-10-CM | POA: Diagnosis not present

## 2016-01-26 DIAGNOSIS — C44222 Squamous cell carcinoma of skin of right ear and external auricular canal: Secondary | ICD-10-CM | POA: Diagnosis not present

## 2016-01-27 ENCOUNTER — Other Ambulatory Visit (INDEPENDENT_AMBULATORY_CARE_PROVIDER_SITE_OTHER): Payer: Self-pay | Admitting: Internal Medicine

## 2016-02-10 ENCOUNTER — Encounter (INDEPENDENT_AMBULATORY_CARE_PROVIDER_SITE_OTHER): Payer: Self-pay | Admitting: *Deleted

## 2016-03-25 ENCOUNTER — Other Ambulatory Visit (INDEPENDENT_AMBULATORY_CARE_PROVIDER_SITE_OTHER): Payer: Self-pay | Admitting: *Deleted

## 2016-03-25 DIAGNOSIS — Z86010 Personal history of colonic polyps: Secondary | ICD-10-CM

## 2016-03-25 DIAGNOSIS — L0231 Cutaneous abscess of buttock: Secondary | ICD-10-CM | POA: Diagnosis not present

## 2016-04-01 DIAGNOSIS — L98491 Non-pressure chronic ulcer of skin of other sites limited to breakdown of skin: Secondary | ICD-10-CM | POA: Diagnosis not present

## 2016-04-13 DIAGNOSIS — D1801 Hemangioma of skin and subcutaneous tissue: Secondary | ICD-10-CM | POA: Diagnosis not present

## 2016-04-13 DIAGNOSIS — L814 Other melanin hyperpigmentation: Secondary | ICD-10-CM | POA: Diagnosis not present

## 2016-04-13 DIAGNOSIS — L821 Other seborrheic keratosis: Secondary | ICD-10-CM | POA: Diagnosis not present

## 2016-04-13 DIAGNOSIS — L570 Actinic keratosis: Secondary | ICD-10-CM | POA: Diagnosis not present

## 2016-04-28 ENCOUNTER — Encounter (INDEPENDENT_AMBULATORY_CARE_PROVIDER_SITE_OTHER): Payer: Self-pay | Admitting: Internal Medicine

## 2016-06-03 ENCOUNTER — Telehealth (INDEPENDENT_AMBULATORY_CARE_PROVIDER_SITE_OTHER): Payer: Self-pay | Admitting: *Deleted

## 2016-06-03 ENCOUNTER — Encounter (INDEPENDENT_AMBULATORY_CARE_PROVIDER_SITE_OTHER): Payer: Self-pay | Admitting: *Deleted

## 2016-06-03 NOTE — Telephone Encounter (Signed)
Patient needs trilyte 

## 2016-06-04 MED ORDER — PEG 3350-KCL-NA BICARB-NACL 420 G PO SOLR: 4000.0000 mL | Freq: Once | ORAL | 0 refills | Status: AC

## 2016-06-17 ENCOUNTER — Telehealth (INDEPENDENT_AMBULATORY_CARE_PROVIDER_SITE_OTHER): Payer: Self-pay | Admitting: *Deleted

## 2016-06-17 NOTE — Telephone Encounter (Signed)
Referring MD/PCP: bluth   Procedure: tcs  Reason/Indication:  Hx polyps  Has patient had this procedure before?  Yes, 2012 -- epic  If so, when, by whom and where?    Is there a family history of colon cancer?  no  Who?  What age when diagnosed?    Is patient diabetic?   no      Does patient have prosthetic heart valve or mechanical valve?  no  Do you have a pacemaker?  no  Has patient ever had endocarditis? no  Has patient had joint replacement within last 12 months?  no  Does patient tend to be constipated or take laxatives? no  Does patient have a history of alcohol/drug use?  no  Is patient on Coumadin, Plavix and/or Aspirin? no  Medications: see epic  Allergies: see epic  Medication Adjustment:   Procedure date & time: 07/14/16 at 930

## 2016-06-18 NOTE — Telephone Encounter (Signed)
agree

## 2016-06-25 DIAGNOSIS — J342 Deviated nasal septum: Secondary | ICD-10-CM | POA: Diagnosis not present

## 2016-06-25 DIAGNOSIS — J301 Allergic rhinitis due to pollen: Secondary | ICD-10-CM | POA: Diagnosis not present

## 2016-06-25 DIAGNOSIS — R0981 Nasal congestion: Secondary | ICD-10-CM | POA: Diagnosis not present

## 2016-07-05 DIAGNOSIS — M545 Low back pain: Secondary | ICD-10-CM | POA: Diagnosis not present

## 2016-07-05 DIAGNOSIS — M810 Age-related osteoporosis without current pathological fracture: Secondary | ICD-10-CM | POA: Diagnosis not present

## 2016-07-09 ENCOUNTER — Encounter (HOSPITAL_COMMUNITY): Payer: Self-pay | Admitting: *Deleted

## 2016-07-09 ENCOUNTER — Emergency Department (HOSPITAL_COMMUNITY)
Admission: EM | Admit: 2016-07-09 | Discharge: 2016-07-09 | Disposition: A | Discharge status: Home / Self Care | Payer: Medicare Other | Attending: Emergency Medicine | Admitting: Emergency Medicine

## 2016-07-09 ENCOUNTER — Emergency Department (HOSPITAL_COMMUNITY): Payer: Medicare Other

## 2016-07-09 DIAGNOSIS — N200 Calculus of kidney: Secondary | ICD-10-CM | POA: Diagnosis not present

## 2016-07-09 DIAGNOSIS — Z87891 Personal history of nicotine dependence: Secondary | ICD-10-CM | POA: Insufficient documentation

## 2016-07-09 DIAGNOSIS — I10 Essential (primary) hypertension: Secondary | ICD-10-CM | POA: Insufficient documentation

## 2016-07-09 DIAGNOSIS — Z79899 Other long term (current) drug therapy: Secondary | ICD-10-CM | POA: Diagnosis not present

## 2016-07-09 DIAGNOSIS — K5732 Diverticulitis of large intestine without perforation or abscess without bleeding: Secondary | ICD-10-CM | POA: Insufficient documentation

## 2016-07-09 DIAGNOSIS — R109 Unspecified abdominal pain: Secondary | ICD-10-CM | POA: Diagnosis present

## 2016-07-09 LAB — URINALYSIS, ROUTINE W REFLEX MICROSCOPIC
Bilirubin Urine: NEGATIVE
GLUCOSE, UA: NEGATIVE mg/dL
Hgb urine dipstick: NEGATIVE
KETONES UR: NEGATIVE mg/dL
LEUKOCYTES UA: NEGATIVE
NITRITE: NEGATIVE
PH: 7 (ref 5.0–8.0)
Protein, ur: NEGATIVE mg/dL

## 2016-07-09 MED ORDER — CIPROFLOXACIN HCL 250 MG PO TABS
500.0000 mg | ORAL_TABLET | Freq: Once | ORAL | Status: AC
  Administered 2016-07-09: 500 mg via ORAL
  Filled 2016-07-09: qty 2

## 2016-07-09 MED ORDER — METRONIDAZOLE 500 MG PO TABS
500.0000 mg | ORAL_TABLET | Freq: Once | ORAL | Status: AC
  Administered 2016-07-09: 500 mg via ORAL
  Filled 2016-07-09: qty 1

## 2016-07-09 MED ORDER — METRONIDAZOLE 500 MG PO TABS: 500.0000 mg | ORAL_TABLET | Freq: Two times a day (BID) | ORAL | 0 refills | Status: DC

## 2016-07-09 MED ORDER — IBUPROFEN 400 MG PO TABS
400.0000 mg | ORAL_TABLET | Freq: Once | ORAL | Status: AC
Start: 2016-07-09 — End: 2016-07-09
  Administered 2016-07-09: 400 mg via ORAL
  Filled 2016-07-09: qty 1

## 2016-07-09 MED ORDER — CIPROFLOXACIN HCL 500 MG PO TABS: 500.0000 mg | ORAL_TABLET | Freq: Two times a day (BID) | ORAL | 0 refills | Status: DC

## 2016-07-09 NOTE — ED Provider Notes (Addendum)
[facility] DEPT Provider Note   CSN: [id number] Arrival date & time: 07/09/16  1551     History   Chief Complaint Chief Complaint  Patient presents with  . Flank Pain    HPI Cameron Atkins is a 70 y.o. male.  He presents for evaluation of left-sided abdominal pain, which reminds him of "passing a kidney stone", for 2 weeks, worse today with increasing pain. His urine has been somewhat darker than usual recently. He denies hematuria, dysuria, urinary frequency, nausea, vomiting, fever, chills or back pain. Last problem with kidney stones about 4 years ago.   HPI  Past Medical History:  Diagnosis Date  . Anxiety   . Arthritis   . Bladder stone   . Depression   . Diverticulosis of colon   . GERD (gastroesophageal reflux disease)   . H/O hiatal hernia   . Helicobacter pylori gastritis   . History of colonoscopy with polypectomy    TUBULAR ADENOMA-- 2012  . History of esophageal dilatation    STRICTURE--  LAST DONE 03/2013  . Hypertension    for 5 yrs  . Left ureteral calculus   . Wears glasses     Patient Active Problem List   Diagnosis Date Noted  . Renal stone 01/15/2014  . GERD (gastroesophageal reflux disease) 06/26/2013  . Osteoporosis 06/26/2013  . Anxiety 06/26/2013    Past Surgical History:  Procedure Laterality Date  . CYSTO/  URETEROSCOPIC LITHOTRIPSY/ STONE EXTRACTION  1995  . CYSTOSCOPY W/ RETROGRADES Right 01/15/2014   Procedure: CYSTOSCOPY WITH RIGHT RETROGRADE PYELOGRAM;  Surgeon: Irine Seal, MD;  Location: [facility];  Service: Urology;  Laterality: Right;  . CYSTOSCOPY WITH RETROGRADE PYELOGRAM, URETEROSCOPY AND STENT PLACEMENT Left 01/15/2014   Procedure: CYSTOSCOPY , LEFT RETROGRADE PYELOGRAM, LEFT URETEROSCOPY, BASKET STONE EXTRACTION;  Surgeon: Irine Seal, MD;  Location[facility];  Service: Urology;  Laterality: Left;  . ESOPHAGEAL DILATION  03/21/2013   Procedure: ESOPHAGEAL DILATION;  Surgeon: Danie Binder, MD;  Location: [facility];  Service: Endoscopy;;  . ESOPHAGOGASTRODUODENOSCOPY N/A 03/21/2013   Procedure: ESOPHAGOGASTRODUODENOSCOPY (EGD);  Surgeon: Danie Binder, MD;  Location: [facility];  Service: Endoscopy;  Laterality: N/A;  . ESOPHAGOGASTRODUODENOSCOPY (EGD) WITH ESOPHAGEAL DILATION N/A 04/26/2013   Procedure: ESOPHAGOGASTRODUODENOSCOPY (EGD) WITH ESOPHAGEAL DILATION;  Surgeon: Rogene Houston, MD;  Location: [facility];  Service: Endoscopy;  Laterality: N/A;  130  . EXTRACORPOREAL SHOCK WAVE LITHOTRIPSY  X2   YRS AGO  . HOLMIUM LASER APPLICATION Left [id number]   Procedure: HOLMIUM LASER APPLICATION, LEFT URETER;  Surgeon: Irine Seal, MD;  Location: [facility];  Service: Urology;  Laterality: Left;  . SHOULDER ARTHROSCOPY W/ SUBACROMIAL DECOMPRESSION AND DISTAL CLAVICLE EXCISION Left 2000       Home Medications    Prior to Admission medications   Medication Sig Start Date End Date Taking? Authorizing Provider  calcium citrate-vitamin D (CITRACAL+D) 315-200 MG-UNIT per tablet Take 2 tablets by mouth daily.     Historical Provider, MD  Cholecalciferol (VITAMIN D3) 3000 units TABS Take 3,000 Units by mouth daily.    Historical Provider, MD  cimetidine (TAGAMET) 400 MG tablet Take 1 tablet (400 mg total) by mouth daily as needed. Patient taking differently: Take 200 mg by mouth at bedtime as needed (for heartburn/reflux.).  06/26/13   Rogene Houston, MD  ciprofloxacin (CIPRO) 500 MG tablet Take 1 tablet (500 mg total) by mouth 2 (two) times daily. One po bid  x 7 days 07/09/16   Daleen Bo, MD  fish oil-omega-3 fatty acids 1000 MG capsule Take 2 g by mouth daily after breakfast.     Historical Provider, MD  gabapentin (NEURONTIN) 600 MG tablet Take 600 mg by mouth as directed. 2- 3 times daily (for depression/mood)    Historical Provider, MD  hydrochlorothiazide (HYDRODIURIL) 25 MG tablet Take 25 mg by mouth daily.  07/02/15   Historical Provider, MD    lisinopril (PRINIVIL,ZESTRIL) 10 MG tablet Take 10 mg by mouth every morning.     Historical Provider, MD  LORazepam (ATIVAN) 1 MG tablet Take 0.5-1 mg by mouth 4 (four) times daily.     Historical Provider, MD  metroNIDAZOLE (FLAGYL) 500 MG tablet Take 1 tablet (500 mg total) by mouth 2 (two) times daily. One po bid x 7 days 07/09/16   Daleen Bo, MD  Multiple Vitamin (MULTIVITAMIN) tablet Take 1 tablet by mouth daily.    Historical Provider, MD  pantoprazole (PROTONIX) 40 MG tablet TAKE 1 TABLET BY MOUTH EVERY MORNING 01/27/16   Butch Penny, NP    Family History No family history on file.  Social History Social History  Substance Use Topics  . Smoking status: Former Smoker    Packs/day: 0.25    Years: 2.00    Types: Cigarettes    Quit date: 01/14/1969  . Smokeless tobacco: Former Systems developer    Types: Chew    Quit date: 01/15/1999  . Alcohol use 12.6 oz/week    21 Cans of beer per week     Comment: 1-2 beer daily     Allergies   Benadryl [diphenhydramine]; Mepergan [meperidine-promethazine]; Other; Oxycodone; Amoxicillin; and Ciprofloxacin   Review of Systems Review of Systems   Physical Exam Updated Vital Signs BP 147/95 (BP Location: Left Arm)   Pulse 66   Temp 98.6 F (37 C) (Oral)   Resp 18   Ht 5\' 6"  (1.676 m)   Wt 153 lb (69.4 kg)   SpO2 98%   BMI 24.69 kg/m   Physical Exam  Constitutional: He is oriented to person, place, and time. He appears well-developed and well-nourished.  HENT:  Head: Normocephalic and atraumatic.  Right Ear: External ear normal.  Left Ear: External ear normal.  Eyes: Conjunctivae and EOM are normal. Pupils are equal, round, and reactive to light.  Neck: Normal range of motion and phonation normal. Neck supple.  Cardiovascular: Normal rate, regular rhythm and normal heart sounds.   Pulmonary/Chest: Effort normal and breath sounds normal. He exhibits no bony tenderness.  Abdominal: Soft. He exhibits no mass. There is tenderness  (Left lower quadrant, mild.). There is no rebound and no guarding. No hernia.  Genitourinary:  Genitourinary Comments: No costovertebral angle tenderness.  Musculoskeletal: Normal range of motion.  Neurological: He is alert and oriented to person, place, and time. No cranial nerve deficit or sensory deficit. He exhibits normal muscle tone. Coordination normal.  Skin: Skin is warm, dry and intact.  Psychiatric: He has a normal mood and affect. His behavior is normal. Judgment and thought content normal.  Nursing note and vitals reviewed.    ED Treatments / Results  Labs (all labs ordered are listed, but only abnormal results are displayed) Labs Reviewed  URINALYSIS, ROUTINE W REFLEX MICROSCOPIC (NOT AT [facility]) - Abnormal; Notable for the following:       Result Value   Specific Gravity, Urine <1.005 (*)    All other components within normal limits    EKG  EKG Interpretation None       Radiology Ct Renal Stone Study  Result Date: 07/09/2016 CLINICAL DATA:  Subacute onset of intermittent left lower quadrant abdominal pain, radiating to the lower back. Hematuria. Initial encounter. EXAM: CT ABDOMEN AND PELVIS WITHOUT CONTRAST TECHNIQUE: Multidetector CT imaging of the abdomen and pelvis was performed following the standard protocol without IV contrast. COMPARISON:  Abdominal radiograph performed 07/24/2014 FINDINGS: Lower chest: Minimal right basilar atelectasis is noted. Scattered coronary artery calcifications are seen. Hepatobiliary: The liver is unremarkable in appearance. The gallbladder is unremarkable in appearance. The common bile duct remains normal in caliber. Pancreas: The pancreas is within normal limits. Spleen: The spleen is unremarkable in appearance. Adrenals/Urinary Tract: The adrenal glands are unremarkable in appearance. Note is made of multiple large bilateral renal cysts, the largest of which measures approximately 8.1 cm. Scattered nonobstructing bilateral renal stones  are seen, larger on the right, measuring up to 8 mm in size. There is no evidence of hydronephrosis. No obstructing ureteral stones are identified. Minimal nonspecific perinephric stranding is noted bilaterally. Stomach/Bowel: The stomach is unremarkable in appearance. The small bowel is within normal limits. The appendix is normal in caliber, without evidence of appendicitis. The colon is unremarkable in appearance Focal wall thickening and soft tissue inflammation is noted at the proximal sigmoid colon, with associated inflamed diverticulum, compatible with acute diverticulitis. There is no evidence of perforation or abscess formation at this time. Trace associated free fluid is seen. Underlying diverticulosis involves the distal descending and proximal sigmoid colon. Vascular/Lymphatic: Minimal calcification is seen along the abdominal aorta. No retroperitoneal or pelvic sidewall lymphadenopathy is seen. Reproductive: The prostate is enlarged, measuring 5.4 cm in transverse dimension, with mild calcification. The bladder is mildly distended and grossly unremarkable in appearance. Other: No additional soft tissue abnormalities are seen. Musculoskeletal: No acute osseous abnormalities are identified. The visualized musculature is unremarkable in appearance. IMPRESSION: 1. Acute diverticulitis at the proximal sigmoid colon, with focal wall thickening and soft tissue inflammation, and trace associated free fluid. No evidence of perforation or abscess formation at this time. Underlying diverticulosis involves the distal descending and proximal sigmoid colon. 2. Multiple large bilateral renal cysts noted, measuring up to 8.1 cm in size. 3. Scattered nonobstructing bilateral renal stones, larger on the right, measuring up to 8 mm in size. 4. Scattered coronary artery calcifications seen. 5. Enlarged prostate noted. Electronically Signed   By: Garald Balding M.D.   On: 07/09/2016 19:50    Procedures Procedures  (including critical care time)  Medications Ordered in ED Medications  ibuprofen (ADVIL,MOTRIN) tablet 400 mg (400 mg Oral Given 07/09/16 1933)  metroNIDAZOLE (FLAGYL) tablet 500 mg (500 mg Oral Given 07/09/16 2018)  ciprofloxacin (CIPRO) tablet 500 mg (500 mg Oral Given 07/09/16 2018)     Initial Impression / Assessment and Plan / ED Course  I have reviewed the triage vital signs and the nursing notes.  Pertinent labs & imaging results that were available during my care of the patient were reviewed by me and considered in my medical decision making (see chart for details).  Clinical Course    Medications  ibuprofen (ADVIL,MOTRIN) tablet 400 mg (400 mg Oral Given 07/09/16 1933)  metroNIDAZOLE (FLAGYL) tablet 500 mg (500 mg Oral Given 07/09/16 2018)  ciprofloxacin (CIPRO) tablet 500 mg (500 mg Oral Given 07/09/16 2018)    Patient Vitals for the past 24 hrs:  BP Temp Temp src Pulse Resp SpO2 Height Weight  07/09/16 2019 147/95 - - 66  18 98 % - -  07/09/16 1924 138/79 - - 75 16 100 % - -  07/09/16 1646 146/80 98.6 F (37 C) Oral 68 16 100 % 5\' 6"  (1.676 m) 153 lb (69.4 kg)    At D/C Reevaluation with update and discussion. After initial assessment and treatment, an updated evaluation reveals No further complaints. He states that his allergy to Cipro, is "my nerves get bad". I discussed the CT imaging results including renal stones, and renal cysts. I asked him to see his urologist about these findings. Findings discussed with patient and all questions answered. Jayde Mcallister L    Final Clinical Impressions(s) / ED Diagnoses   Final diagnoses:  Diverticulitis of large intestine without perforation or abscess without bleeding    Abdominal pain with mild diverticulitis. Patient nontoxic, tolerating oral liquids, and has reassuring vital signs. He is stable for outpatient treatment, with oral medications at this time.   Nursing Notes Reviewed/ Care Coordinated Applicable Imaging  Reviewed Interpretation of Laboratory Data incorporated into ED treatment  The patient appears reasonably screened and/or stabilized for discharge and I doubt any other medical condition or other [facility] requiring further screening, evaluation, or treatment in the ED at this time prior to discharge.  Plan: Home Medications- continue; Home Treatments- rest; return here if the recommended treatment, does not improve the symptoms; Recommended follow up- PCP, prn  New Prescriptions Discharge Medication List as of 07/09/2016  8:44 PM    START taking these medications   Details  ciprofloxacin (CIPRO) 500 MG tablet Take 1 tablet (500 mg total) by mouth 2 (two) times daily. One po bid x 7 days, Starting Fri 07/09/2016, Print    metroNIDAZOLE (FLAGYL) 500 MG tablet Take 1 tablet (500 mg total) by mouth 2 (two) times daily. One po bid x 7 days, Starting Fri 07/09/2016, Print         Daleen Bo, MD 07/09/16 [id number]    Daleen Bo, MD 07/28/16 2340

## 2016-07-09 NOTE — ED Triage Notes (Signed)
Pt comes in for flank pain starting 2 weeks ago. He has hx of kidney stones. Intermittent bloody urine is present.

## 2016-07-09 NOTE — ED Notes (Signed)
Pt alert & oriented x4, stable gait. Patient given discharge instructions, paperwork & prescription(s). Patient  instructed to stop at the registration desk to finish any additional paperwork. Patient verbalized understanding. Pt left department w/ no further questions. 

## 2016-07-14 ENCOUNTER — Ambulatory Visit (HOSPITAL_COMMUNITY): Admission: RE | Admit: 2016-07-14 | Payer: Medicare Other | Source: Ambulatory Visit | Admitting: Internal Medicine

## 2016-07-14 ENCOUNTER — Encounter (HOSPITAL_COMMUNITY): Admission: RE | Payer: Self-pay | Source: Ambulatory Visit

## 2016-07-14 SURGERY — COLONOSCOPY
Anesthesia: Moderate Sedation

## 2016-07-15 DIAGNOSIS — M545 Low back pain: Secondary | ICD-10-CM | POA: Diagnosis not present

## 2016-07-20 DIAGNOSIS — M810 Age-related osteoporosis without current pathological fracture: Secondary | ICD-10-CM | POA: Diagnosis not present

## 2016-07-20 DIAGNOSIS — M545 Low back pain: Secondary | ICD-10-CM | POA: Diagnosis not present

## 2016-08-03 ENCOUNTER — Ambulatory Visit (INDEPENDENT_AMBULATORY_CARE_PROVIDER_SITE_OTHER): Payer: Medicare Other | Admitting: Internal Medicine

## 2016-08-03 ENCOUNTER — Encounter (INDEPENDENT_AMBULATORY_CARE_PROVIDER_SITE_OTHER): Payer: Self-pay | Admitting: Internal Medicine

## 2016-08-03 VITALS — BP 118/70 | HR 64 | Temp 97.9°F | Ht 66.0 in | Wt 154.2 lb

## 2016-08-03 DIAGNOSIS — K2100 Gastro-esophageal reflux disease with esophagitis, without bleeding: Secondary | ICD-10-CM

## 2016-08-03 DIAGNOSIS — K222 Esophageal obstruction: Secondary | ICD-10-CM | POA: Diagnosis not present

## 2016-08-03 DIAGNOSIS — Z86010 Personal history of colonic polyps: Secondary | ICD-10-CM

## 2016-08-03 NOTE — Progress Notes (Signed)
Presenting complaint;  Follow-up for chronic GERD and history of colonic polyps  Subjective:  Patient is 70 year old Caucasian male who is here for scheduled visit. He was last seen on 03/21/2016 and he was scheduled for surveillance colonoscopy on 07/14/2016. However he developed left low quadrant abdominal pain and ended up in emergency room on 07/09/2016 and was diagnosed with sigmoid diverticulitis. Therefore colonoscopy was canceled. He was also found to have large renal cysts and small stones and he is scheduled to be seen by urologist.  He also had MR lumbar spine by Dr. Rolena Infante on 07/15/2016 which once again showed these cysts in addition to disc disease as well as for omental stenosis. Patient was treated with by mouth antibiotics for about 10 days. Abdominal pain has completely resolved. He is having formed stools daily. He has heartburn no more than once a week with certain foods. He has difficulty swallowing meat. He has to chew would very [place] otherwise it does not go down. He has history of esophageal stricture which was last dilated in July 2014. He denies nausea vomiting melena or rectal bleeding. He states he does not have a good appetite but has not lost any weight. He was seen by Dr. Rolena Infante and advice to do physical therapy. He states he fell but 2 years ago and ever since has had back pain. He has an appointment to see urologist on 08/18/2016.   Current Medications: Outpatient Encounter Prescriptions as of 08/03/2016  Medication Sig  . calcium citrate-vitamin D (CITRACAL+D) 315-200 MG-UNIT per tablet Take 2 tablets by mouth daily.   . Cholecalciferol (VITAMIN D3) 3000 units TABS Take 3,000 Units by mouth daily.  . cimetidine (TAGAMET) 400 MG tablet Take 1 tablet (400 mg total) by mouth daily as needed. (Patient taking differently: Take 200 mg by mouth at bedtime as needed (for heartburn/reflux.). )  . fish oil-omega-3 fatty acids 1000 MG capsule Take 2 g by mouth daily after  breakfast.   . gabapentin (NEURONTIN) 600 MG tablet Take 600 mg by mouth as directed. 2- 3 times daily (for depression/mood)  . hydrochlorothiazide (HYDRODIURIL) 25 MG tablet Take 25 mg by mouth daily.   Marland Kitchen lisinopril (PRINIVIL,ZESTRIL) 10 MG tablet Take 10 mg by mouth every morning.   Marland Kitchen LORazepam (ATIVAN) 1 MG tablet Take 0.5-1 mg by mouth 4 (four) times daily.   . pantoprazole (PROTONIX) 40 MG tablet TAKE 1 TABLET BY MOUTH EVERY MORNING  . Multiple Vitamin (MULTIVITAMIN) tablet Take 1 tablet by mouth daily.  . [DISCONTINUED] ciprofloxacin (CIPRO) 500 MG tablet Take 1 tablet (500 mg total) by mouth 2 (two) times daily. One po bid x 7 days (Patient not taking: Reported on 08/03/2016)  . [DISCONTINUED] metroNIDAZOLE (FLAGYL) 500 MG tablet Take 1 tablet (500 mg total) by mouth 2 (two) times daily. One po bid x 7 days (Patient not taking: Reported on 08/03/2016)   No facility-administered encounter medications on file as of 08/03/2016.      Objective: Blood pressure 118/70, pulse 64, temperature 97.9 F (36.6 C), temperature source Oral, height 5\' 6"  (1.676 m), weight 154 lb 3.2 oz (69.9 kg). Patient is alert and in no acute distress. Conjunctiva is pink. Sclera is nonicteric Oropharyngeal mucosa is normal. No neck masses or thyromegaly noted. Cardiac exam with regular rhythm normal S1 and S2. No murmur or gallop noted.Lungs are clear to auscultation. Abdomen is symmetrical soft and nontender without organomegaly or masses. No LE edema or clubbing noted.  Labs/studies Results:   abdominopelvic CT  from 07/09/2016 reviewed alongside patient.   showed changes of sigmoid diverticulitis bilateral renal cysts with the largest one is on the right side as well as small renal stones.  Assessment:  #1. recent bout with sigmoid diverticulitis. He has completed antibiotic therapy and is asymptomatic. Ration advice to call office if symptoms relapse.  #2. chronic GERD complicated by esophageal  stricture. He is having some dysphagia. He also has anorexia. Therefore upper GI tract needs to be evaluated.  #3. History of colonic polyps.    Plan:  Continue pantoprazole at current dose of 40 mg by mouth every morning. Will schedule patient for EGD possible EGD and surveillance colonoscopy in 3-4 weeks.

## 2016-08-03 NOTE — Patient Instructions (Signed)
If esophagogastroduodenoscopy, possible esophageal dilation and colonoscopy to be scheduled in 3-4 weeks.

## 2016-08-04 ENCOUNTER — Other Ambulatory Visit (INDEPENDENT_AMBULATORY_CARE_PROVIDER_SITE_OTHER): Payer: Self-pay | Admitting: *Deleted

## 2016-08-04 ENCOUNTER — Encounter (INDEPENDENT_AMBULATORY_CARE_PROVIDER_SITE_OTHER): Payer: Self-pay | Admitting: *Deleted

## 2016-08-04 ENCOUNTER — Telehealth (INDEPENDENT_AMBULATORY_CARE_PROVIDER_SITE_OTHER): Payer: Self-pay | Admitting: *Deleted

## 2016-08-04 DIAGNOSIS — Z86010 Personal history of colonic polyps: Secondary | ICD-10-CM

## 2016-08-04 DIAGNOSIS — K219 Gastro-esophageal reflux disease without esophagitis: Secondary | ICD-10-CM

## 2016-08-04 DIAGNOSIS — K222 Esophageal obstruction: Secondary | ICD-10-CM

## 2016-08-04 MED ORDER — PEG 3350-KCL-NA BICARB-NACL 420 G PO SOLR: 4000.0000 mL | Freq: Once | ORAL | 0 refills | Status: AC

## 2016-08-04 NOTE — Telephone Encounter (Signed)
Patient needs trilyte 

## 2016-08-06 ENCOUNTER — Encounter (INDEPENDENT_AMBULATORY_CARE_PROVIDER_SITE_OTHER): Payer: Self-pay

## 2016-08-18 DIAGNOSIS — R3912 Poor urinary stream: Secondary | ICD-10-CM | POA: Diagnosis not present

## 2016-08-18 DIAGNOSIS — N401 Enlarged prostate with lower urinary tract symptoms: Secondary | ICD-10-CM | POA: Diagnosis not present

## 2016-08-18 DIAGNOSIS — N200 Calculus of kidney: Secondary | ICD-10-CM | POA: Diagnosis not present

## 2016-08-24 DIAGNOSIS — M545 Low back pain: Secondary | ICD-10-CM | POA: Diagnosis not present

## 2016-08-27 DIAGNOSIS — M545 Low back pain: Secondary | ICD-10-CM | POA: Diagnosis not present

## 2016-08-27 DIAGNOSIS — Z23 Encounter for immunization: Secondary | ICD-10-CM | POA: Diagnosis not present

## 2016-08-31 DIAGNOSIS — M545 Low back pain: Secondary | ICD-10-CM | POA: Diagnosis not present

## 2016-09-02 NOTE — Patient Instructions (Signed)
Cameron Atkins  09/02/2016     @PREFPERIOPPHARMACY @   Your procedure is scheduled on 09/10/2016.  Report to [facility] at 8:50 A.M.  Call this number if you have problems the morning of surgery:  [phone]   Remember:  Do not eat food or drink liquids after midnight.  Take these medicines the morning of surgery with A SIP OF WATER : Tagamet, Neurontin, Lisinopril, Ativan and Protonix   Do not wear jewelry, make-up or nail polish.  Do not wear lotions, powders, or perfumes, or deoderant.  Do not shave 48 hours prior to surgery.  Men may shave face and neck.  Do not bring valuables to the hospital.  [facility] is not responsible for any belongings or valuables.  Contacts, dentures or bridgework may not be worn into surgery.  Leave your suitcase in the car.  After surgery it may be brought to your room.  For patients admitted to the hospital, discharge time will be determined by your treatment team.  Patients discharged the day of surgery will not be allowed to drive home.   Name and phone number of your driver:   family Special instructions:  n/a  Please read over the following fact sheets that you were given. Care and Recovery After Surgery    Colonoscopy, Adult A colonoscopy is an exam to look at the entire large intestine. During the exam, a lubricated, bendable tube is inserted into the anus and then passed into the rectum, colon, and other parts of the large intestine. A colonoscopy is often done as a part of normal colorectal screening or in response to certain symptoms, such as anemia, persistent diarrhea, abdominal pain, and blood in the stool. The exam can help screen for and diagnose medical problems, including:  Tumors.  Polyps.  Inflammation.  Areas of bleeding. Tell a health care provider about:  Any allergies you have.  All medicines you are taking, including vitamins, herbs, eye drops, creams, and over-the-counter medicines.  Any problems you or  family members have had with anesthetic medicines.  Any blood disorders you have.  Any surgeries you have had.  Any medical conditions you have.  Any problems you have had passing stool. What are the risks? Generally, this is a safe procedure. However, problems may occur, including:  Bleeding.  A tear in the intestine.  A reaction to medicines given during the exam.  Infection (rare). What happens before the procedure? Eating and drinking restrictions  Follow instructions from your health care provider about eating and drinking, which may include:  A few days before the procedure - follow a low-fiber diet. Avoid nuts, seeds, dried fruit, raw fruits, and vegetables.  1-3 days before the procedure - follow a clear liquid diet. Drink only clear liquids, such as clear broth or bouillon, black coffee or tea, clear juice, clear soft drinks or sports drinks, gelatin desert, and popsicles. Avoid any liquids that contain red or purple dye.  On the day of the procedure - do not eat or drink anything during the 2 hours before the procedure, or within the time period that your health care provider recommends. Bowel prep  If you were prescribed an oral bowel prep to clean out your colon:  Take it as told by your health care provider. Starting the day before your procedure, you will need to drink a large amount of medicated liquid. The liquid will cause you to have multiple loose stools until your stool is almost clear or light  green.  If your skin or anus gets irritated from diarrhea, you may use these to relieve the irritation:  Medicated wipes, such as adult wet wipes with aloe and vitamin E.  A skin soothing-product like petroleum jelly.  If you vomit while drinking the bowel prep, take a break for up to 60 minutes and then begin the bowel prep again. If vomiting continues and you cannot take the bowel prep without vomiting, call your health care provider. General instructions  Ask  your health care provider about changing or stopping your regular medicines. This is especially important if you are taking diabetes medicines or blood thinners.  Plan to have someone take you home from the hospital or clinic. What happens during the procedure?  An IV tube may be inserted into one of your veins.  You will be given medicine to help you relax (sedative).  To reduce your risk of infection:  Your health care team will wash or sanitize their hands.  Your anal area will be washed with soap.  You will be asked to lie on your side with your knees bent.  Your health care provider will lubricate a long, thin, flexible tube. The tube will have a camera and a light on the end.  The tube will be inserted into your anus.  The tube will be gently eased through your rectum and colon.  Air will be delivered into your colon to keep it open. You may feel some pressure or cramping.  The camera will be used to take images during the procedure.  A small tissue sample may be removed from your body to be examined under a microscope (biopsy). If any potential problems are found, the tissue will be sent to a lab for testing.  If small polyps are found, your health care provider may remove them and have them checked for cancer cells.  The tube that was inserted into your anus will be slowly removed. The procedure may vary among health care providers and hospitals. What happens after the procedure?  Your blood pressure, heart rate, breathing rate, and blood oxygen level will be monitored until the medicines you were given have worn off.  Do not drive for 24 hours after the exam.  You may have a small amount of blood in your stool.  You may pass gas and have mild abdominal cramping or bloating due to the air that was used to inflate your colon during the exam.  It is up to you to get the results of your procedure. Ask your health care provider, or the department performing the  procedure, when your results will be ready. This information is not intended to replace advice given to you by your health care provider. Make sure you discuss any questions you have with your health care provider. Document Released: 09/10/2000 Document Revised: 04/02/2016 Document Reviewed: 11/25/2015 Elsevier Interactive Patient Education  2017 [place].  Esophagogastroduodenoscopy Introduction Esophagogastroduodenoscopy (EGD) is a procedure to examine the lining of the esophagus, stomach, and first part of the small intestine (duodenum). This procedure is done to check for problems such as inflammation, bleeding, ulcers, or growths. During this procedure, a long, flexible, lighted tube with a camera attached (endoscope) is inserted down the throat. Tell a health care provider about:  Any allergies you have.  All medicines you are taking, including vitamins, herbs, eye drops, creams, and over-the-counter medicines.  Any problems you or family members have had with anesthetic medicines.  Any blood disorders you have.  Any surgeries you have had.  Any medical conditions you have.  Whether you are pregnant or may be pregnant. What are the risks? Generally, this is a safe procedure. However, problems may occur, including:  Infection.  Bleeding.  A tear (perforation) in the esophagus, stomach, or duodenum.  Trouble breathing.  Excessive sweating.  Spasms of the larynx.  A slowed heartbeat.  Low blood pressure. What happens before the procedure?  Follow instructions from your health care provider about eating or drinking restrictions.  Ask your health care provider about:  Changing or stopping your regular medicines. This is especially important if you are taking diabetes medicines or blood thinners.  Taking medicines such as aspirin and ibuprofen. These medicines can thin your blood. Do not take these medicines before your procedure if your health care provider  instructs you not to.  Plan to have someone take you home after the procedure.  If you wear dentures, be ready to remove them before the procedure. What happens during the procedure?  To reduce your risk of infection, your health care team will wash or sanitize their hands.  An IV tube will be put in a vein in your hand or arm. You will get medicines and fluids through this tube.  You will be given one or more of the following:  A medicine to help you relax (sedative).  A medicine to numb the area (local anesthetic). This medicine may be sprayed into your throat. It will make you feel more comfortable and keep you from gagging or coughing during the procedure.  A medicine for pain.  A mouth guard may be placed in your mouth to protect your teeth and to keep you from biting on the endoscope.  You will be asked to lie on your left side.  The endoscope will be lowered down your throat into your esophagus, stomach, and duodenum.  Air will be put into the endoscope. This will help your health care provider see better.  The lining of your esophagus, stomach, and duodenum will be examined.  Your health care provider may:  Take a tissue sample so it can be looked at in a lab (biopsy).  Remove growths.  Remove objects (foreign bodies) that are stuck.  Treat any bleeding with medicines or other devices that stop tissue from bleeding.  Widen (dilate) or stretch narrowed areas of your esophagus and stomach.  The endoscope will be taken out. The procedure may vary among health care providers and hospitals. What happens after the procedure?  Your blood pressure, heart rate, breathing rate, and blood oxygen level will be monitored often until the medicines you were given have worn off.  Do not eat or drink anything until the numbing medicine has worn off and your gag reflex has returned. This information is not intended to replace advice given to you by your health care provider.  Make sure you discuss any questions you have with your health care provider. Document Released: 01/14/2005 Document Revised: 02/19/2016 Document Reviewed: 08/07/2015  2017 Elsevier

## 2016-09-03 DIAGNOSIS — M545 Low back pain: Secondary | ICD-10-CM | POA: Diagnosis not present

## 2016-09-06 ENCOUNTER — Encounter (HOSPITAL_COMMUNITY)
Admission: RE | Admit: 2016-09-06 | Discharge: 2016-09-06 | Disposition: A | Discharge status: Home / Self Care | Payer: Medicare Other | Source: Ambulatory Visit | Attending: Internal Medicine | Admitting: Internal Medicine

## 2016-09-06 ENCOUNTER — Encounter (HOSPITAL_COMMUNITY): Payer: Self-pay

## 2016-09-06 DIAGNOSIS — K219 Gastro-esophageal reflux disease without esophagitis: Secondary | ICD-10-CM

## 2016-09-06 DIAGNOSIS — Z01812 Encounter for preprocedural laboratory examination: Secondary | ICD-10-CM | POA: Diagnosis not present

## 2016-09-06 DIAGNOSIS — Z01810 Encounter for preprocedural cardiovascular examination: Secondary | ICD-10-CM | POA: Insufficient documentation

## 2016-09-06 DIAGNOSIS — Z86010 Personal history of colonic polyps: Secondary | ICD-10-CM

## 2016-09-06 DIAGNOSIS — K222 Esophageal obstruction: Secondary | ICD-10-CM

## 2016-09-06 LAB — BASIC METABOLIC PANEL
Anion gap: 9 (ref 5–15)
BUN: 24 mg/dL — AB (ref 6–20)
CHLORIDE: 100 mmol/L — AB (ref 101–111)
CO2: 24 mmol/L (ref 22–32)
Calcium: 9.7 mg/dL (ref 8.9–10.3)
Creatinine, Ser: 1.59 mg/dL — ABNORMAL HIGH (ref 0.61–1.24)
GFR, EST AFRICAN AMERICAN: 49 mL/min — AB (ref >60)
GFR, EST NON AFRICAN AMERICAN: 42 mL/min — AB (ref >60)
Glucose, Bld: 110 mg/dL — ABNORMAL HIGH (ref 65–99)
POTASSIUM: 4.1 mmol/L (ref 3.5–5.1)
SODIUM: 133 mmol/L — AB (ref 135–145)

## 2016-09-06 LAB — CBC WITH DIFFERENTIAL/PLATELET
BASOS ABS: 0 10*3/uL (ref 0.0–0.1)
BASOS PCT: 0 %
EOS ABS: 0.2 10*3/uL (ref 0.0–0.7)
EOS PCT: 3 %
HCT: 37.3 % — ABNORMAL LOW (ref 39.0–52.0)
HEMOGLOBIN: 12.7 g/dL — AB (ref 13.0–17.0)
LYMPHS PCT: 36 %
Lymphs Abs: 2.3 10*3/uL (ref 0.7–4.0)
MCH: 30.7 pg (ref 26.0–34.0)
MCHC: 34 g/dL (ref 30.0–36.0)
MCV: 90.1 fL (ref 78.0–100.0)
Monocytes Absolute: 0.7 10*3/uL (ref 0.1–1.0)
Monocytes Relative: 12 %
NEUTROS ABS: 3.1 10*3/uL (ref 1.7–7.7)
Neutrophils Relative %: 49 %
PLATELETS: 292 10*3/uL (ref 150–400)
RBC: 4.14 MIL/uL — AB (ref 4.22–5.81)
RDW: 12.4 % (ref 11.5–15.5)
WBC: 6.3 10*3/uL (ref 4.0–10.5)

## 2016-09-07 DIAGNOSIS — M545 Low back pain: Secondary | ICD-10-CM | POA: Diagnosis not present

## 2016-09-10 ENCOUNTER — Encounter (HOSPITAL_COMMUNITY): Payer: Self-pay | Admitting: *Deleted

## 2016-09-10 ENCOUNTER — Ambulatory Visit (HOSPITAL_COMMUNITY)
Admission: RE | Admit: 2016-09-10 | Discharge: 2016-09-10 | Disposition: A | Discharge status: Home / Self Care | Payer: Medicare Other | Source: Ambulatory Visit | Attending: Internal Medicine | Admitting: Internal Medicine

## 2016-09-10 ENCOUNTER — Encounter (HOSPITAL_COMMUNITY)
Admission: RE | Disposition: A | Discharge status: Home / Self Care | Payer: Self-pay | Source: Ambulatory Visit | Attending: Internal Medicine

## 2016-09-10 ENCOUNTER — Ambulatory Visit (HOSPITAL_COMMUNITY): Payer: Medicare Other | Admitting: Anesthesiology

## 2016-09-10 DIAGNOSIS — F419 Anxiety disorder, unspecified: Secondary | ICD-10-CM | POA: Insufficient documentation

## 2016-09-10 DIAGNOSIS — K621 Rectal polyp: Secondary | ICD-10-CM | POA: Insufficient documentation

## 2016-09-10 DIAGNOSIS — K6289 Other specified diseases of anus and rectum: Secondary | ICD-10-CM | POA: Insufficient documentation

## 2016-09-10 DIAGNOSIS — K635 Polyp of colon: Secondary | ICD-10-CM | POA: Diagnosis not present

## 2016-09-10 DIAGNOSIS — K219 Gastro-esophageal reflux disease without esophagitis: Secondary | ICD-10-CM

## 2016-09-10 DIAGNOSIS — Z860100 Personal history of colon polyps, unspecified: Secondary | ICD-10-CM

## 2016-09-10 DIAGNOSIS — Z1211 Encounter for screening for malignant neoplasm of colon: Secondary | ICD-10-CM | POA: Insufficient documentation

## 2016-09-10 DIAGNOSIS — Z79899 Other long term (current) drug therapy: Secondary | ICD-10-CM | POA: Insufficient documentation

## 2016-09-10 DIAGNOSIS — K5730 Diverticulosis of large intestine without perforation or abscess without bleeding: Secondary | ICD-10-CM | POA: Insufficient documentation

## 2016-09-10 DIAGNOSIS — R1314 Dysphagia, pharyngoesophageal phase: Secondary | ICD-10-CM | POA: Diagnosis not present

## 2016-09-10 DIAGNOSIS — I10 Essential (primary) hypertension: Secondary | ICD-10-CM | POA: Insufficient documentation

## 2016-09-10 DIAGNOSIS — Z87891 Personal history of nicotine dependence: Secondary | ICD-10-CM | POA: Diagnosis not present

## 2016-09-10 DIAGNOSIS — Z09 Encounter for follow-up examination after completed treatment for conditions other than malignant neoplasm: Secondary | ICD-10-CM | POA: Diagnosis not present

## 2016-09-10 DIAGNOSIS — K648 Other hemorrhoids: Secondary | ICD-10-CM | POA: Diagnosis not present

## 2016-09-10 DIAGNOSIS — K222 Esophageal obstruction: Secondary | ICD-10-CM

## 2016-09-10 DIAGNOSIS — F329 Major depressive disorder, single episode, unspecified: Secondary | ICD-10-CM | POA: Diagnosis not present

## 2016-09-10 DIAGNOSIS — K449 Diaphragmatic hernia without obstruction or gangrene: Secondary | ICD-10-CM | POA: Insufficient documentation

## 2016-09-10 HISTORY — PX: COLONOSCOPY WITH PROPOFOL: SHX5780

## 2016-09-10 HISTORY — PX: BIOPSY: SHX5522

## 2016-09-10 HISTORY — PX: ESOPHAGOGASTRODUODENOSCOPY (EGD) WITH PROPOFOL: SHX5813

## 2016-09-10 HISTORY — PX: ESOPHAGEAL DILATION: SHX303

## 2016-09-10 SURGERY — ESOPHAGOGASTRODUODENOSCOPY (EGD) WITH PROPOFOL
Anesthesia: Monitor Anesthesia Care

## 2016-09-10 MED ORDER — MIDAZOLAM HCL 2 MG/2ML IJ SOLN
INTRAMUSCULAR | Status: AC
  Filled 2016-09-10: qty 2

## 2016-09-10 MED ORDER — BUTAMBEN-TETRACAINE-BENZOCAINE 2-2-14 % EX AERO
1.0000 | INHALATION_SPRAY | Freq: Once | CUTANEOUS | Status: DC
Start: 2016-09-10 — End: 2016-09-10

## 2016-09-10 MED ORDER — CHLORHEXIDINE GLUCONATE CLOTH 2 % EX PADS: 6.0000 | MEDICATED_PAD | Freq: Once | CUTANEOUS | Status: DC

## 2016-09-10 MED ORDER — MIDAZOLAM HCL 5 MG/5ML IJ SOLN
INTRAMUSCULAR | Status: DC | PRN
  Administered 2016-09-10: 2 mg via INTRAVENOUS

## 2016-09-10 MED ORDER — FENTANYL CITRATE (PF) 100 MCG/2ML IJ SOLN
25.0000 ug | INTRAMUSCULAR | Status: AC | PRN
  Administered 2016-09-10 (×2): 25 ug via INTRAVENOUS

## 2016-09-10 MED ORDER — PROPOFOL 10 MG/ML IV BOLUS
INTRAVENOUS | Status: AC
  Filled 2016-09-10: qty 20

## 2016-09-10 MED ORDER — PROPOFOL 10 MG/ML IV BOLUS
INTRAVENOUS | Status: AC
  Filled 2016-09-10: qty 40

## 2016-09-10 MED ORDER — FENTANYL CITRATE (PF) 100 MCG/2ML IJ SOLN
INTRAMUSCULAR | Status: AC
  Filled 2016-09-10: qty 2

## 2016-09-10 MED ORDER — MIDAZOLAM HCL 2 MG/2ML IJ SOLN
1.0000 mg | INTRAMUSCULAR | Status: DC | PRN
  Administered 2016-09-10 (×2): 2 mg via INTRAVENOUS
  Filled 2016-09-10 (×2): qty 2

## 2016-09-10 MED ORDER — LACTATED RINGERS IV SOLN
INTRAVENOUS | Status: DC
  Administered 2016-09-10: 08:00:00 via INTRAVENOUS

## 2016-09-10 MED ORDER — SIMETHICONE 40 MG/0.6ML PO SUSP
ORAL | Status: DC | PRN
  Administered 2016-09-10: 100 mL

## 2016-09-10 MED ORDER — PROPOFOL 500 MG/50ML IV EMUL
INTRAVENOUS | Status: DC | PRN
  Administered 2016-09-10: 150 ug/kg/min via INTRAVENOUS
  Administered 2016-09-10: 10:00:00 via INTRAVENOUS

## 2016-09-10 NOTE — Discharge Instructions (Signed)
No aspirin or NSAIDs for 24 hours. Resume usual medications and high fiber diet. No driving for 24 hours. Physician will call with biopsy results.   High-Fiber Diet Fiber, also called dietary fiber, is a type of carbohydrate found in fruits, vegetables, whole grains, and beans. A high-fiber diet can have many health benefits. Your health care provider may recommend a high-fiber diet to help:  Prevent constipation. Fiber can make your bowel movements more regular.  Lower your cholesterol.  Relieve hemorrhoids, uncomplicated diverticulosis, or irritable bowel syndrome.  Prevent overeating as part of a weight-loss plan.  Prevent heart disease, type 2 diabetes, and certain cancers. What is my plan? The recommended daily intake of fiber includes:  38 grams for men under age 78.  60 grams for men over age 52.  72 grams for women under age 55.  66 grams for women over age 73. You can get the recommended daily intake of dietary fiber by eating a variety of fruits, vegetables, grains, and beans. Your health care provider may also recommend a fiber supplement if it is not possible to get enough fiber through your diet. What do I need to know about a high-fiber diet?  Fiber supplements have not been widely studied for their effectiveness, so it is better to get fiber through food sources.  Always check the fiber content on thenutrition facts label of any prepackaged food. Look for foods that contain at least 5 grams of fiber per serving.  Ask your dietitian if you have questions about specific foods that are related to your condition, especially if those foods are not listed in the following section.  Increase your daily fiber consumption gradually. Increasing your intake of dietary fiber too quickly may cause bloating, cramping, or gas.  Drink plenty of water. Water helps you to digest fiber. What foods can I eat? Grains  Whole-grain breads. Multigrain cereal. Oats and oatmeal. Brown  rice. Barley. Bulgur wheat. [place]. Bran muffins. Popcorn. Rye wafer crackers. Vegetables  Sweet potatoes. Spinach. Kale. Artichokes. Cabbage. Broccoli. Green peas. Carrots. Squash. Fruits  Berries. Pears. Apples. Oranges. Avocados. Prunes and raisins. Dried figs. Meats and Other Protein Sources  [place], kidney, pinto, and soy beans. Split peas. Lentils. Nuts and seeds. Dairy  Fiber-fortified yogurt. Beverages  Fiber-fortified soy milk. Fiber-fortified orange juice. Other  Fiber bars. The items listed above may not be a complete list of recommended foods or beverages. Contact your dietitian for more options.  What foods are not recommended? Grains  White bread. Pasta made with refined flour. White rice. Vegetables  Fried potatoes. Canned vegetables. Well-cooked vegetables. Fruits  Fruit juice. Cooked, strained fruit. Meats and Other Protein Sources  Fatty cuts of meat. Fried Sales executive or fried fish. Dairy  Milk. Yogurt. Cream cheese. Sour cream. Beverages  Soft drinks. Other  Cakes and pastries. Butter and oils. The items listed above may not be a complete list of foods and beverages to avoid. Contact your dietitian for more information.  What are some tips for including high-fiber foods in my diet?  Eat a wide variety of high-fiber foods.  Make sure that half of all grains consumed each day are whole grains.  Replace breads and cereals made from refined flour or white flour with whole-grain breads and cereals.  Replace white rice with brown rice, bulgur wheat, or millet.  Start the day with a breakfast that is high in fiber, such as a cereal that contains at least 5 grams of fiber per serving.  Use beans in  place of meat in soups, salads, or pasta.  Eat high-fiber snacks, such as berries, raw vegetables, nuts, or popcorn. This information is not intended to replace advice given to you by your health care provider. Make sure you discuss any questions you have with your health  care provider. Document Released: 09/13/2005 Document Revised: 02/19/2016 Document Reviewed: 02/26/2014 Elsevier Interactive Patient Education  2017 [place]. Esophagogastroduodenoscopy, Care After Introduction Refer to this sheet in the next few weeks. These instructions provide you with information about caring for yourself after your procedure. Your health care provider may also give you more specific instructions. Your treatment has been planned according to current medical practices, but problems sometimes occur. Call your health care provider if you have any problems or questions after your procedure. What can I expect after the procedure? After the procedure, it is common to have:  A sore throat.  Nausea.  Bloating.  Dizziness.  Fatigue. Follow these instructions at home:  Do not eat or drink anything until the numbing medicine (local anesthetic) has worn off and your gag reflex has returned. You will know that the local anesthetic has worn off when you can swallow comfortably.  Do not drive for 24 hours if you received a medicine to help you relax (sedative).  If your health care provider took a tissue sample for testing during the procedure, make sure to get your test results. This is your responsibility. Ask your health care provider or the department performing the test when your results will be ready.  Keep all follow-up visits as told by your health care provider. This is important. Contact a health care provider if:  You cannot stop coughing.  You are not urinating.  You are urinating less than usual. Get help right away if:  You have trouble swallowing.  You cannot eat or drink.  You have throat or chest pain that gets worse.  You are dizzy or light-headed.  You faint.  You have nausea or vomiting.  You have chills.  You have a fever.  You have severe abdominal pain.  You have black, tarry, or bloody stools. This information is not intended  to replace advice given to you by your health care provider. Make sure you discuss any questions you have with your health care provider. Document Released: 08/30/2012 Document Revised: 02/19/2016 Document Reviewed: 08/07/2015  2017 Elsevier  Colonoscopy, Adult, Care After This sheet gives you information about how to care for yourself after your procedure. Your health care provider may also give you more specific instructions. If you have problems or questions, contact your health care provider. What can I expect after the procedure? After the procedure, it is common to have:  A small amount of blood in your stool for 24 hours after the procedure.  Some gas.  Mild abdominal cramping or bloating. Follow these instructions at home: General instructions  For the first 24 hours after the procedure:  Do not drive or use machinery.  Do not sign important documents.  Do not drink alcohol.  Do your regular daily activities at a slower pace than normal.  Eat soft, easy-to-digest foods.  Rest often.  Take over-the-counter or prescription medicines only as told by your health care provider.  It is up to you to get the results of your procedure. Ask your health care provider, or the department performing the procedure, when your results will be ready. Relieving cramping and bloating  Try walking around when you have cramps or feel bloated.  Apply heat to your abdomen as told by your health care provider. Use a heat source that your health care provider recommends, such as a moist heat pack or a heating pad.  Place a towel between your skin and the heat source.  Leave the heat on for 20-30 minutes.  Remove the heat if your skin turns bright red. This is especially important if you are unable to feel pain, heat, or cold. You may have a greater risk of getting burned. Eating and drinking  Drink enough fluid to keep your urine clear or pale yellow.  Resume your normal diet as  instructed by your health care provider. Avoid heavy or fried foods that are hard to digest.  Avoid drinking alcohol for as long as instructed by your health care provider. Contact a health care provider if:  You have blood in your stool 2-3 days after the procedure. Get help right away if:  You have more than a small spotting of blood in your stool.  You pass large blood clots in your stool.  Your abdomen is swollen.  You have nausea or vomiting.  You have a fever.  You have increasing abdominal pain that is not relieved with medicine. This information is not intended to replace advice given to you by your health care provider. Make sure you discuss any questions you have with your health care provider. Document Released: 04/27/2004 Document Revised: 06/07/2016 Document Reviewed: 11/25/2015 Elsevier Interactive Patient Education  2017 [employer].

## 2016-09-10 NOTE — Anesthesia Preprocedure Evaluation (Signed)
Anesthesia Evaluation  Patient identified by MRN, date of birth, ID band Patient awake    Reviewed: Allergy & Precautions, H&P , NPO status , Patient's Chart, lab work & pertinent test results  Airway Mallampati: II  TM Distance: >3 FB     Dental  (+) Dental Advisory Given   Pulmonary former smoker,    breath sounds clear to auscultation       Cardiovascular hypertension, Pt. on medications  Rhythm:Regular Rate:Normal     Neuro/Psych PSYCHIATRIC DISORDERS Anxiety Depression negative neurological ROS     GI/Hepatic Neg liver ROS, hiatal hernia, GERD  Medicated,(+)     substance abuse  alcohol use,   Endo/Other  negative endocrine ROS  Renal/GU negative Renal ROS     Musculoskeletal negative musculoskeletal ROS (+)   Abdominal   Peds  Hematology negative hematology ROS (+)   Anesthesia Other Findings   Reproductive/Obstetrics                             Anesthesia Physical Anesthesia Plan  ASA: III  Anesthesia Plan: MAC   Post-op Pain Management:    Induction: Intravenous  Airway Management Planned: Simple Face Mask  Additional Equipment:   Intra-op Plan:   Post-operative Plan:   Informed Consent: I have reviewed the patients History and Physical, chart, labs and discussed the procedure including the risks, benefits and alternatives for the proposed anesthesia with the patient or authorized representative who has indicated his/her understanding and acceptance.     Plan Discussed with: CRNA  Anesthesia Plan Comments:         Anesthesia Quick Evaluation

## 2016-09-10 NOTE — Op Note (Signed)
[facility] Patient Name: Cameron Atkins Procedure Date: 09/10/2016 9:02 AM MRN: [id number] Date of Birth: 1946-05-14 Attending MD: Hildred Laser , MD CSN: [id number] Age: 70 Admit Type: Outpatient Procedure:                Upper GI endoscopy Indications:              Esophageal dysphagia Providers:                Hildred Laser, MD, Rosina Lowenstein, RN, Randa Spike, Technician, Aram Candela Referring MD:             Celedonio Savage, MD Medicines:                Propofol per Anesthesia, Cetacaine spray Complications:            No immediate complications. Estimated Blood Loss:     Estimated blood loss was minimal. Procedure:                Pre-Anesthesia Assessment:                           - Prior to the procedure, a History and Physical                            was performed, and patient medications and                            allergies were reviewed. The patient's tolerance of                            previous anesthesia was also reviewed. The risks                            and benefits of the procedure and the sedation                            options and risks were discussed with the patient.                            All questions were answered, and informed consent                            was obtained. Prior Anticoagulants: The patient has                            taken no previous anticoagulant or antiplatelet                            agents. ASA Grade Assessment: II - A patient with                            mild systemic disease. After reviewing the risks  and benefits, the patient was deemed in                            satisfactory condition to undergo the procedure.                           After obtaining informed consent, the endoscope was                            passed under direct vision. Throughout the                            procedure, the patient's blood pressure, pulse, and                  oxygen saturations were monitored continuously. The                            [id number]) scope was introduced through the                            and advanced to the second part of duodenum. The                            upper GI endoscopy was accomplished without                            difficulty. The patient tolerated the procedure                            well. Scope In: 9:25:15 AM Scope Out: 9:36:55 AM Total Procedure Duration: 0 hours 11 minutes 40 seconds  Findings:      The examined esophagus was normal.      A moderate Schatzki ring (acquired) was found at the gastroesophageal       junction. The scope was withdrawn. Dilation was performed with a Maloney       dilator with no resistance at 75 Fr. The dilation site was examined       following endoscope reinsertion and showed ring was intact and therefore       disrupted with four-quadrant biopsy.      A 3 cm hiatal hernia was present.      The entire examined stomach was normal.      The duodenal bulb and second portion of the duodenum were normal. Impression:               - Normal esophagus.                           - Moderate Schatzki ring. Dilated. Ring was intact                            and disrupted with 4 quadrant biopsy. No tissue                            saved.                           -  3 cm hiatal hernia. GE junction at 34 cm and                            hiatus at 37 cm from the incisors.                           - Normal stomach.                           - Normal duodenal bulb and second portion of the                            duodenum.                           - No specimens collected. Moderate Sedation:      Per Anesthesia Care Recommendation:           - Patient has a contact number available for                            emergencies. The signs and symptoms of potential                            delayed complications were discussed with the                             patient. Return to normal activities tomorrow.                            Written discharge instructions were provided to the                            patient.                           - Resume previous diet today.                           - Continue present medications.                           - See the other procedure note for documentation of                            additional recommendations. Procedure Code(s):        --- Professional ---                           [phone], Esophagogastroduodenoscopy, flexible,                            transoral; diagnostic, including collection of                            specimen(s) by brushing or washing, when performed                            (  separate procedure)                           43450, Dilation of esophagus, by unguided sound or                            bougie, single or multiple passes Diagnosis Code(s):        --- Professional ---                           K22.2, Esophageal obstruction                           K44.9, Diaphragmatic hernia without obstruction or                            gangrene                           R13.14, Dysphagia, pharyngoesophageal phase CPT copyright 2016 American [employer]. All rights reserved. The codes documented in this report are preliminary and upon coder review may  be revised to meet current compliance requirements. Hildred Laser, MD Hildred Laser, MD 09/10/2016 10:14:43 AM This report has been signed electronically. Number of Addenda: 0

## 2016-09-10 NOTE — Transfer of Care (Signed)
Immediate Anesthesia Transfer of Care Note  Patient: Cameron Atkins  Procedure(s) Performed: Procedure(s) with comments: ESOPHAGOGASTRODUODENOSCOPY (EGD) WITH PROPOFOL (N/A) - 10:20 ESOPHAGEAL DILATION (N/A) COLONOSCOPY WITH PROPOFOL (N/A) BIOPSY - rectal polyp  Patient Location: PACU  Anesthesia Type:MAC  Level of Consciousness: awake, alert , oriented and patient cooperative  Airway & Oxygen Therapy: Patient Spontanous Breathing and Patient connected to face mask oxygen  Post-op Assessment: Report given to RN and Post -op Vital signs reviewed and stable  Post vital signs: Reviewed and stable  Last Vitals:  Vitals:   09/10/16 0850 09/10/16 0910  BP: 129/83 124/84  Pulse: 75   Resp: 14 14  Temp:      Last Pain:  Vitals:   09/10/16 0805  TempSrc: Oral      Patients Stated Pain Goal: 5 ([id number])  Complications: No apparent anesthesia complications

## 2016-09-10 NOTE — Op Note (Signed)
[facility] Patient Name: Cameron Atkins Procedure Date: 09/10/2016 9:40 AM MRN: [id number] Date of Birth: May 04, 1946 Attending MD: Hildred Laser , MD CSN: [id number] Age: 70 Admit Type: Outpatient Procedure:                Colonoscopy Indications:              High risk colon cancer surveillance: Personal                            history of colonic polyps Providers:                Hildred Laser, MD, Rosina Lowenstein, RN, Aram Candela Referring MD:             Celedonio Savage, MD Medicines:                Monitored Anesthesia Care Complications:            No immediate complications. Estimated Blood Loss:     Estimated blood loss: none. Procedure:                Pre-Anesthesia Assessment:                           - Prior to the procedure, a History and Physical                            was performed, and patient medications and                            allergies were reviewed. The patient's tolerance of                            previous anesthesia was also reviewed. The risks                            and benefits of the procedure and the sedation                            options and risks were discussed with the patient.                            All questions were answered, and informed consent                            was obtained. Prior Anticoagulants: The patient has                            taken no previous anticoagulant or antiplatelet                            agents. ASA Grade Assessment: II - A patient with                            mild systemic disease. After reviewing the risks  and benefits, the patient was deemed in                            satisfactory condition to undergo the procedure.                           After obtaining informed consent, the colonoscope                            was passed under direct vision. Throughout the                            procedure, the patient's blood pressure, pulse, and               oxygen saturations were monitored continuously. The                            [id number]) scope was introduced through                            the anus and advanced to the the cecum, identified                            by appendiceal orifice and ileocecal valve. The                            colonoscopy was performed without difficulty. The                            patient tolerated the procedure well. The quality                            of the bowel preparation was good. The ileocecal                            valve, appendiceal orifice, and rectum were                            photographed. Scope In: 9:42:48 AM Scope Out: 10:03:05 AM Scope Withdrawal Time: 0 hours 9 minutes 32 seconds  Total Procedure Duration: 0 hours 20 minutes 17 seconds  Findings:      The perianal and digital rectal examinations were normal.      Multiple medium-mouthed diverticula were found in the sigmoid colon.      A small polyp was found in the rectum. The polyp was sessile. Biopsies       were taken with a cold forceps for histology.      Internal hemorrhoids were found during retroflexion. The hemorrhoids       were small.      Anal papilla(e) were hypertrophied. Impression:               - Diverticulosis in the sigmoid colon.                           - One small polyp in the rectum. Biopsied.                           -  Internal hemorrhoids.                           - Anal papilla(e) were hypertrophied. Moderate Sedation:      Per Anesthesia Care Recommendation:           - Patient has a contact number available for                            emergencies. The signs and symptoms of potential                            delayed complications were discussed with the                            patient. Return to normal activities tomorrow.                            Written discharge instructions were provided to the                            patient.                            - High fiber diet today.                           - Continue present medications.                           - Await pathology results.                           - Repeat colonoscopy in 5 years for surveillance. Procedure Code(s):        --- Professional ---                           [phone], Colonoscopy, flexible; with biopsy, single                            or multiple Diagnosis Code(s):        --- Professional ---                           Z86.010, Personal history of colonic polyps                           K64.8, Other hemorrhoids                           K62.1, Rectal polyp                           K62.89, Other specified diseases of anus and rectum                           K57.30, Diverticulosis of large intestine without  perforation or abscess without bleeding CPT copyright 2016 American Medical Association. All rights reserved. The codes documented in this report are preliminary and upon coder review may  be revised to meet current compliance requirements. Hildred Laser, MD Hildred Laser, MD 09/10/2016 10:18:08 AM This report has been signed electronically. Number of Addenda: 0

## 2016-09-10 NOTE — Anesthesia Postprocedure Evaluation (Signed)
Anesthesia Post Note  Patient: Nalen Gresh Standlee  Procedure(s) Performed: Procedure(s) (LRB): ESOPHAGOGASTRODUODENOSCOPY (EGD) WITH PROPOFOL (N/A) ESOPHAGEAL DILATION (N/A) COLONOSCOPY WITH PROPOFOL (N/A) BIOPSY  Patient location during evaluation: PACU Anesthesia Type: MAC Level of consciousness: awake and alert and oriented Pain management: pain level controlled Vital Signs Assessment: post-procedure vital signs reviewed and stable Respiratory status: spontaneous breathing Cardiovascular status: stable Postop Assessment: no signs of nausea or vomiting Anesthetic complications: no    Last Vitals:  Vitals:   09/10/16 0850 09/10/16 0910  BP: 129/83 124/84  Pulse: 75   Resp: 14 14  Temp:      Last Pain:  Vitals:   09/10/16 0805  TempSrc: Oral                 ADAMS, AMY A

## 2016-09-10 NOTE — Anesthesia Procedure Notes (Signed)
Procedure Name: MAC Date/Time: 09/10/2016 9:17 AM Performed by: Andree Elk, Aleese Kamps A Pre-anesthesia Checklist: Patient identified, Emergency Drugs available, Suction available, Patient being monitored and Timeout performed Oxygen Delivery Method: Simple face mask

## 2016-09-10 NOTE — H&P (Signed)
Cameron Atkins is an 70 y.o. male.   Chief Complaint: Patient is here for EGD possible ED and colonoscopy. HPI: Patient is 70 year old Caucasian male who has history of GERD complicated by esophageal stricture which has been dilated in the past. Lately is had mild dysphagia. He states is controlled by eating slowly and chewing his food well. He has history of colonic polyps. He was scheduled to undergo colonoscopy a few months ago but he developed diverticulitis. He has fully recovered. He denies melena or rectal bleeding. He has good appetite and his weight has been stable. Last colonoscopy was 5 years ago with removal of a tubular adenoma. He has had adenomas removed on prior colonoscopy.  Past Medical History:  Diagnosis Date  . Anxiety   . Arthritis   . Bladder stone   . Depression   . Diverticulosis of colon   . GERD (gastroesophageal reflux disease)   . H/O hiatal hernia   . Helicobacter pylori gastritis   . History of colonoscopy with polypectomy    TUBULAR ADENOMA-- 2012  . History of esophageal dilatation    STRICTURE--  LAST DONE 03/2013  . Hypertension    for 5 yrs  . Left ureteral calculus   . Wears glasses     Past Surgical History:  Procedure Laterality Date  . CYSTO/  URETEROSCOPIC LITHOTRIPSY/ STONE EXTRACTION  1995  . CYSTOSCOPY W/ RETROGRADES Right 01/15/2014   Procedure: CYSTOSCOPY WITH RIGHT RETROGRADE PYELOGRAM;  Surgeon: Irine Seal, MD;  Location: [facility];  Service: Urology;  Laterality: Right;  . CYSTOSCOPY WITH RETROGRADE PYELOGRAM, URETEROSCOPY AND STENT PLACEMENT Left 01/15/2014   Procedure: CYSTOSCOPY , LEFT RETROGRADE PYELOGRAM, LEFT URETEROSCOPY, BASKET STONE EXTRACTION;  Surgeon: Irine Seal, MD;  Location: [facility];  Service: Urology;  Laterality: Left;  . ESOPHAGEAL DILATION  03/21/2013   Procedure: ESOPHAGEAL DILATION;  Surgeon: Danie Binder, MD;  Location: [facility];  Service: Endoscopy;;  .  ESOPHAGOGASTRODUODENOSCOPY N/A 03/21/2013   Procedure: ESOPHAGOGASTRODUODENOSCOPY (EGD);  Surgeon: Danie Binder, MD;  Location: [facility];  Service: Endoscopy;  Laterality: N/A;  . ESOPHAGOGASTRODUODENOSCOPY (EGD) WITH ESOPHAGEAL DILATION N/A 04/26/2013   Procedure: ESOPHAGOGASTRODUODENOSCOPY (EGD) WITH ESOPHAGEAL DILATION;  Surgeon: Rogene Houston, MD;  Location: [facility];  Service: Endoscopy;  Laterality: N/A;  130  . EXTRACORPOREAL SHOCK WAVE LITHOTRIPSY  X2   YRS AGO  . HOLMIUM LASER APPLICATION Left [id number]   Procedure: HOLMIUM LASER APPLICATION, LEFT URETER;  Surgeon: Irine Seal, MD;  Location: [facility];  Service: Urology;  Laterality: Left;  . SHOULDER ARTHROSCOPY W/ SUBACROMIAL DECOMPRESSION AND DISTAL CLAVICLE EXCISION Left 2000    History reviewed. No pertinent family history. Social History:  reports that he quit smoking about 47 years ago. His smoking use included Cigarettes. He has a 0.50 pack-year smoking history. He quit smokeless tobacco use about 17 years ago. His smokeless tobacco use included Chew. He reports that he drinks about 12.6 oz of alcohol per week . He reports that he does not use drugs.  Allergies:  Allergies  Allergen Reactions  . Benadryl [Diphenhydramine] Other (See Comments)    anxious  . Mepergan [Meperidine-Promethazine]     Vomiting.   . Other     Antidepressants--per patient he cannot take.  Marland Kitchen Oxycodone Itching    hyperactivity  . Amoxicillin     Has patient had a PCN reaction causing immediate rash, facial/tongue/throat swelling, SOB or lightheadedness with hypotension:No Has patient had a  PCN reaction causing severe rash involving mucus membranes or skin necrosis:No Has patient had a PCN reaction that required hospitalization:No Has patient had a PCN reaction occurring within the last 10 years:Yes If all of the above answers are "NO", then may proceed with Cephalosporin use. "severe anxiety-ativan does not remedy"  .  Ciprofloxacin     "anxious"    Medications Prior to Admission  Medication Sig Dispense Refill  . calcium citrate-vitamin D (CITRACAL+D) 315-200 MG-UNIT per tablet Take 1 tablet by mouth daily.     . Cholecalciferol (VITAMIN D3) 3000 units TABS Take 3,000 Units by mouth daily.    . cimetidine (TAGAMET) 400 MG tablet Take 1 tablet (400 mg total) by mouth daily as needed. (Patient taking differently: Take 200 mg by mouth at bedtime as needed (for heartburn/reflux.). ) 30 tablet 5  . fish oil-omega-3 fatty acids 1000 MG capsule Take 2 g by mouth daily after breakfast.     . gabapentin (NEURONTIN) 600 MG tablet Take 600 mg by mouth 3 (three) times daily. 2- 3 times daily (for depression/mood)    . hydrochlorothiazide (HYDRODIURIL) 25 MG tablet Take 25 mg by mouth daily.     Marland Kitchen lisinopril (PRINIVIL,ZESTRIL) 10 MG tablet Take 10 mg by mouth every morning.     Marland Kitchen LORazepam (ATIVAN) 1 MG tablet Take 1 mg by mouth 4 (four) times daily.     . Multiple Vitamin (MULTIVITAMIN) tablet Take 1 tablet by mouth daily.    . pantoprazole (PROTONIX) 40 MG tablet TAKE 1 TABLET BY MOUTH EVERY MORNING 30 tablet 11    No results found for this or any previous visit (from the past 48 hour(s)). No results found.  ROS  Blood pressure 140/83, pulse 82, temperature 97.6 F (36.4 C), temperature source Oral, resp. rate (!) 24, SpO2 97 %. Physical Exam  Constitutional: He appears well-developed and well-nourished.  HENT:  Mouth/Throat: Oropharynx is clear and moist.  Eyes: Conjunctivae are normal. No scleral icterus.  Neck: No thyromegaly present.  Cardiovascular: Normal rate, regular rhythm and normal heart sounds.   No murmur heard. Respiratory: Effort normal and breath sounds normal.  GI:  Abdomen is symmetrical and soft with mild tenderness at LLQ. No guarding. No organomegaly or masses.  Musculoskeletal: He exhibits no edema.  Lymphadenopathy:    He has no cervical adenopathy.  Neurological: He is alert.   Skin: Skin is warm and dry.     Assessment/Plan Solid food dysphagia in a patient with GERD and known esophageal stricture. History of colonic polyps. EGD with possible EGD and colonoscopy under MAC.-  Hildred Laser, MD 09/10/2016, 9:09 AM

## 2016-09-13 DIAGNOSIS — M545 Low back pain: Secondary | ICD-10-CM | POA: Diagnosis not present

## 2016-09-14 ENCOUNTER — Encounter (HOSPITAL_COMMUNITY): Payer: Self-pay | Admitting: Internal Medicine

## 2016-09-15 DIAGNOSIS — M545 Low back pain: Secondary | ICD-10-CM | POA: Diagnosis not present

## 2016-10-01 DIAGNOSIS — J320 Chronic maxillary sinusitis: Secondary | ICD-10-CM | POA: Diagnosis not present

## 2016-10-21 DIAGNOSIS — H9201 Otalgia, right ear: Secondary | ICD-10-CM | POA: Diagnosis not present

## 2016-11-23 DIAGNOSIS — H3589 Other specified retinal disorders: Secondary | ICD-10-CM | POA: Diagnosis not present

## 2016-11-23 DIAGNOSIS — H35461 Secondary vitreoretinal degeneration, right eye: Secondary | ICD-10-CM | POA: Diagnosis not present

## 2016-11-23 DIAGNOSIS — H43393 Other vitreous opacities, bilateral: Secondary | ICD-10-CM | POA: Diagnosis not present

## 2016-11-23 DIAGNOSIS — H25813 Combined forms of age-related cataract, bilateral: Secondary | ICD-10-CM | POA: Diagnosis not present

## 2016-11-23 DIAGNOSIS — H25013 Cortical age-related cataract, bilateral: Secondary | ICD-10-CM | POA: Diagnosis not present

## 2016-11-23 DIAGNOSIS — H524 Presbyopia: Secondary | ICD-10-CM | POA: Diagnosis not present

## 2016-11-23 DIAGNOSIS — H2513 Age-related nuclear cataract, bilateral: Secondary | ICD-10-CM | POA: Diagnosis not present

## 2016-11-23 DIAGNOSIS — H11153 Pinguecula, bilateral: Secondary | ICD-10-CM | POA: Diagnosis not present

## 2016-11-23 DIAGNOSIS — H35363 Drusen (degenerative) of macula, bilateral: Secondary | ICD-10-CM | POA: Diagnosis not present

## 2016-11-23 DIAGNOSIS — D3132 Benign neoplasm of left choroid: Secondary | ICD-10-CM | POA: Diagnosis not present

## 2016-11-23 DIAGNOSIS — H31002 Unspecified chorioretinal scars, left eye: Secondary | ICD-10-CM | POA: Diagnosis not present

## 2017-01-24 DIAGNOSIS — I69391 Dysphagia following cerebral infarction: Secondary | ICD-10-CM | POA: Diagnosis not present

## 2017-01-24 DIAGNOSIS — F419 Anxiety disorder, unspecified: Secondary | ICD-10-CM | POA: Diagnosis not present

## 2017-01-24 DIAGNOSIS — J309 Allergic rhinitis, unspecified: Secondary | ICD-10-CM | POA: Diagnosis not present

## 2017-01-24 DIAGNOSIS — M810 Age-related osteoporosis without current pathological fracture: Secondary | ICD-10-CM | POA: Diagnosis not present

## 2017-01-24 DIAGNOSIS — R51 Headache: Secondary | ICD-10-CM | POA: Diagnosis not present

## 2017-01-24 DIAGNOSIS — F329 Major depressive disorder, single episode, unspecified: Secondary | ICD-10-CM | POA: Diagnosis not present

## 2017-01-24 DIAGNOSIS — I10 Essential (primary) hypertension: Secondary | ICD-10-CM | POA: Diagnosis not present

## 2017-01-24 DIAGNOSIS — K219 Gastro-esophageal reflux disease without esophagitis: Secondary | ICD-10-CM | POA: Diagnosis not present

## 2017-01-27 ENCOUNTER — Other Ambulatory Visit (INDEPENDENT_AMBULATORY_CARE_PROVIDER_SITE_OTHER): Payer: Self-pay | Admitting: Internal Medicine

## 2017-02-16 DIAGNOSIS — L570 Actinic keratosis: Secondary | ICD-10-CM | POA: Diagnosis not present

## 2017-03-04 ENCOUNTER — Ambulatory Visit (INDEPENDENT_AMBULATORY_CARE_PROVIDER_SITE_OTHER): Payer: Medicare Other | Admitting: Family Medicine

## 2017-03-04 ENCOUNTER — Encounter (INDEPENDENT_AMBULATORY_CARE_PROVIDER_SITE_OTHER): Payer: Self-pay

## 2017-03-04 ENCOUNTER — Encounter: Payer: Self-pay | Admitting: Family Medicine

## 2017-03-04 VITALS — BP 123/69 | HR 72 | Temp 97.4°F | Ht 66.0 in | Wt 162.0 lb

## 2017-03-04 DIAGNOSIS — K2100 Gastro-esophageal reflux disease with esophagitis, without bleeding: Secondary | ICD-10-CM

## 2017-03-04 DIAGNOSIS — E782 Mixed hyperlipidemia: Secondary | ICD-10-CM

## 2017-03-04 DIAGNOSIS — M810 Age-related osteoporosis without current pathological fracture: Secondary | ICD-10-CM | POA: Diagnosis not present

## 2017-03-04 DIAGNOSIS — E538 Deficiency of other specified B group vitamins: Secondary | ICD-10-CM | POA: Diagnosis not present

## 2017-03-04 DIAGNOSIS — Z125 Encounter for screening for malignant neoplasm of prostate: Secondary | ICD-10-CM

## 2017-03-04 DIAGNOSIS — E559 Vitamin D deficiency, unspecified: Secondary | ICD-10-CM | POA: Diagnosis not present

## 2017-03-04 DIAGNOSIS — F419 Anxiety disorder, unspecified: Secondary | ICD-10-CM | POA: Diagnosis not present

## 2017-03-04 DIAGNOSIS — I10 Essential (primary) hypertension: Secondary | ICD-10-CM | POA: Diagnosis not present

## 2017-03-04 LAB — URINALYSIS
Bilirubin, UA: NEGATIVE
Glucose, UA: NEGATIVE
Ketones, UA: NEGATIVE
LEUKOCYTES UA: NEGATIVE
Nitrite, UA: NEGATIVE
PROTEIN UA: NEGATIVE
RBC, UA: NEGATIVE
Specific Gravity, UA: 1.01 (ref 1.005–1.030)
Urobilinogen, Ur: 0.2 mg/dL (ref 0.2–1.0)
pH, UA: 7 (ref 5.0–7.5)

## 2017-03-04 NOTE — Progress Notes (Signed)
Subjective:  Patient ID: Cameron Atkins, male    DOB: Jun 06, 1946  Age: 71 y.o. MRN: [id number]  CC: New Patient (Initial Visit) (pt here today to establish care and is also c/o what he thinks is a tonsil stone on the right side of his throat.)   HPI Cameron Atkins presents for chronic depression and anxiety. Anxiety issues  Cause him to feeel dspneic. Ativan relieves it. Taken 1/2 -1 tab QID. Has taken multiple meds for depression. Former M.D. Gave him everything new when it came out. The only thing helpful has been gabapentin.   follow-up of hypertension. Patient has no history of headache chest pain or shortness of breath or recent cough. Patient also denies symptoms of TIA such as numbness weakness lateralizing. Patient checks  blood pressure at home and has not had any elevated readings recently. Patient denies side effects from his medication. States taking it regularly. Patient in for follow-up of GERD. Currently asymptomatic taking  PPI daily. There is no chest pain or heartburn. No hematemesis and no melena. No dysphagia or choking. Onset is remote. Progression is stable. Complicating factors, none.   History Cameron Atkins has a past medical history of Anxiety; Arthritis; Bladder stone; Depression; Diverticulosis of colon; GERD (gastroesophageal reflux disease); H/O hiatal hernia; Helicobacter pylori gastritis; History of colonoscopy with polypectomy; History of esophageal dilatation; Hypertension; Left ureteral calculus; and Wears glasses.   He has a past surgical history that includes Esophagogastroduodenoscopy (N/A, 03/21/2013); Esophageal dilation (03/21/2013); Esophagogastroduodenoscopy (egd) with esophageal dilation (N/A, 04/26/2013); Extracorporeal shock wave lithotripsy (X2   YRS AGO); Shoulder arthroscopy w/ subacromial decompression and distal clavicle excision (Left, 2000); CYSTO/  URETEROSCOPIC LITHOTRIPSY/ STONE EXTRACTION (1995); Cystoscopy with retrograde pyelogram, ureteroscopy and  stent placement (Left, 01/15/2014); Holmium laser application (Left, 6/44/0347); Cystoscopy w/ retrogrades (Right, 01/15/2014); Esophagogastroduodenoscopy (egd) with propofol (N/A, 09/10/2016); Esophageal dilation (N/A, 09/10/2016); Colonoscopy with propofol (N/A, 09/10/2016); and biopsy (09/10/2016).   His family history is not on file.He reports that he quit smoking about 48 years ago. His smoking use included Cigarettes. He has a 0.50 pack-year smoking history. He quit smokeless tobacco use about 18 years ago. His smokeless tobacco use included Chew. He reports that he drinks about 12.6 oz of alcohol per week . He reports that he does not use drugs.  Current Outpatient Prescriptions on File Prior to Visit  Medication Sig Dispense Refill  . calcium citrate-vitamin D (CITRACAL+D) 315-200 MG-UNIT per tablet Take 1 tablet by mouth daily.     . Cholecalciferol (VITAMIN D3) 3000 units TABS Take 3,000 Units by mouth daily.    . cimetidine (TAGAMET) 400 MG tablet Take 1 tablet (400 mg total) by mouth daily as needed. (Patient taking differently: Take 200 mg by mouth at bedtime as needed (for heartburn/reflux.). ) 30 tablet 5  . fish oil-omega-3 fatty acids 1000 MG capsule Take 2 g by mouth daily after breakfast.     . gabapentin (NEURONTIN) 600 MG tablet Take 600 mg by mouth 3 (three) times daily. 2- 3 times daily (for depression/mood)    . hydrochlorothiazide (HYDRODIURIL) 25 MG tablet Take 25 mg by mouth daily.     Marland Kitchen lisinopril (PRINIVIL,ZESTRIL) 10 MG tablet Take 10 mg by mouth every morning.     Marland Kitchen LORazepam (ATIVAN) 1 MG tablet Take 1 mg by mouth 4 (four) times daily.     . Multiple Vitamin (MULTIVITAMIN) tablet Take 1 tablet by mouth daily.    . pantoprazole (PROTONIX) 40 MG tablet TAKE 1 TABLET BY  MOUTH EVERY MORNING 30 tablet 11   No current facility-administered medications on file prior to visit.     ROS Review of Systems  Constitutional: Negative for chills, diaphoresis, fever and unexpected  weight change.  HENT: Negative for congestion, hearing loss, rhinorrhea and sore throat.   Eyes: Negative for visual disturbance.  Respiratory: Negative for cough and shortness of breath.   Cardiovascular: Negative for chest pain.  Gastrointestinal: Negative for abdominal pain, constipation and diarrhea.  Genitourinary: Negative for dysuria and flank pain.  Musculoskeletal: Negative for arthralgias and joint swelling.  Skin: Negative for rash.  Neurological: Negative for dizziness and headaches.  Psychiatric/Behavioral: Positive for dysphoric mood. Negative for sleep disturbance. The patient is nervous/anxious.     Objective:  BP 123/69   Pulse 72   Temp 97.4 F (36.3 C) (Oral)   Ht '5\' 6"'$  (1.676 m)   Wt 162 lb (73.5 kg)   BMI 26.15 kg/m   Physical Exam  Constitutional: He is oriented to person, place, and time. He appears well-developed and well-nourished. No distress.  HENT:  Head: Normocephalic and atraumatic.  Right Ear: External ear normal.  Left Ear: External ear normal.  Nose: Nose normal.  Mouth/Throat: Oropharynx is clear and moist.  Eyes: Conjunctivae and EOM are normal. Pupils are equal, round, and reactive to light.  Neck: Normal range of motion. Neck supple. No thyromegaly present.  Cardiovascular: Normal rate, regular rhythm and normal heart sounds.   No murmur heard. Pulmonary/Chest: Effort normal and breath sounds normal. No respiratory distress. He has no wheezes. He has no rales.  Abdominal: Soft. Bowel sounds are normal. He exhibits no distension. There is no tenderness.  Lymphadenopathy:    He has no cervical adenopathy.  Neurological: He is alert and oriented to person, place, and time. He has normal reflexes.  Skin: Skin is warm and dry.  Psychiatric: He has a normal mood and affect. His behavior is normal. Judgment and thought content normal.    Assessment & Plan:   Breion was seen today for new patient (initial visit).  Diagnoses and all orders  for this visit:  Gastroesophageal reflux disease with esophagitis -     CBC with Differential/Platelet -     CMP14+EGFR -     Urinalysis  Age-related osteoporosis without current pathological fracture -     CMP14+EGFR -     Urinalysis  Anxiety -     CMP14+EGFR -     Urinalysis  Mixed hyperlipidemia -     CMP14+EGFR -     Lipid panel -     Urinalysis  Essential hypertension -     CMP14+EGFR -     Urinalysis  Vitamin D deficiency -     CMP14+EGFR -     Urinalysis -     VITAMIN D 25 Hydroxy (Vit-D Deficiency, Fractures)  Vitamin B12 deficiency -     CBC with Differential/Platelet -     CMP14+EGFR -     Urinalysis -     Vitamin B12  Screening for prostate cancer -     PSA Total (Reflex To Free)   I am having Cameron Atkins maintain his LORazepam, lisinopril, gabapentin, fish oil-omega-3 fatty acids, cimetidine, calcium citrate-vitamin D, multivitamin, hydrochlorothiazide, Vitamin D3, pantoprazole, and hydrOXYzine.  Meds ordered this encounter  Medications  . hydrOXYzine (ATARAX/VISTARIL) 25 MG tablet    Sig: Take 25 mg by mouth 3 (three) times daily.     Follow-up: Return in about 5 months (around 07/27/2017) for  Depression and anxiety.  Claretta Fraise, M.D.

## 2017-03-05 LAB — CBC WITH DIFFERENTIAL/PLATELET
BASOS ABS: 0 10*3/uL (ref 0.0–0.2)
BASOS: 0 %
EOS (ABSOLUTE): 0.2 10*3/uL (ref 0.0–0.4)
Eos: 5 %
Hematocrit: 36.1 % — ABNORMAL LOW (ref 37.5–51.0)
Hemoglobin: 12.3 g/dL — ABNORMAL LOW (ref 13.0–17.7)
IMMATURE GRANS (ABS): 0 10*3/uL (ref 0.0–0.1)
IMMATURE GRANULOCYTES: 0 %
LYMPHS: 36 %
Lymphocytes Absolute: 1.7 10*3/uL (ref 0.7–3.1)
MCH: 30.3 pg (ref 26.6–33.0)
MCHC: 34.1 g/dL (ref 31.5–35.7)
MCV: 89 fL (ref 79–97)
Monocytes Absolute: 0.7 10*3/uL (ref 0.1–0.9)
Monocytes: 15 %
NEUTROS PCT: 44 %
Neutrophils Absolute: 2.1 10*3/uL (ref 1.4–7.0)
Platelets: 286 10*3/uL (ref 150–379)
RBC: 4.06 x10E6/uL — AB (ref 4.14–5.80)
RDW: 13.1 % (ref 12.3–15.4)
WBC: 4.8 10*3/uL (ref 3.4–10.8)

## 2017-03-05 LAB — CMP14+EGFR
ALT: 16 IU/L (ref 0–44)
AST: 28 IU/L (ref 0–40)
Albumin/Globulin Ratio: 1.6 (ref 1.2–2.2)
Albumin: 4.1 g/dL (ref 3.5–4.8)
Alkaline Phosphatase: 59 IU/L (ref 39–117)
BUN/Creatinine Ratio: 11 (ref 10–24)
BUN: 13 mg/dL (ref 8–27)
Bilirubin Total: 0.5 mg/dL (ref 0.0–1.2)
CALCIUM: 9.5 mg/dL (ref 8.6–10.2)
CO2: 24 mmol/L (ref 18–29)
Chloride: 98 mmol/L (ref 96–106)
Creatinine, Ser: 1.15 mg/dL (ref 0.76–1.27)
GFR, EST AFRICAN AMERICAN: 73 mL/min/{1.73_m2} (ref >59)
GFR, EST NON AFRICAN AMERICAN: 63 mL/min/{1.73_m2} (ref >59)
GLUCOSE: 96 mg/dL (ref 65–99)
Globulin, Total: 2.5 g/dL (ref 1.5–4.5)
Potassium: 4.2 mmol/L (ref 3.5–5.2)
Sodium: 136 mmol/L (ref 134–144)
TOTAL PROTEIN: 6.6 g/dL (ref 6.0–8.5)

## 2017-03-05 LAB — PSA TOTAL (REFLEX TO FREE): Prostate Specific Ag, Serum: 1.5 ng/mL (ref 0.0–4.0)

## 2017-03-05 LAB — LIPID PANEL
CHOL/HDL RATIO: 2.2 ratio (ref 0.0–5.0)
Cholesterol, Total: 168 mg/dL (ref 100–199)
HDL: 78 mg/dL (ref >39)
LDL Calculated: 74 mg/dL (ref 0–99)
TRIGLYCERIDES: 80 mg/dL (ref 0–149)
VLDL CHOLESTEROL CAL: 16 mg/dL (ref 5–40)

## 2017-03-05 LAB — VITAMIN D 25 HYDROXY (VIT D DEFICIENCY, FRACTURES): Vit D, 25-Hydroxy: 36.5 ng/mL (ref 30.0–100.0)

## 2017-03-05 LAB — VITAMIN B12: Vitamin B-12: 604 pg/mL (ref 232–1245)

## 2017-04-12 DIAGNOSIS — D1801 Hemangioma of skin and subcutaneous tissue: Secondary | ICD-10-CM | POA: Diagnosis not present

## 2017-04-12 DIAGNOSIS — L814 Other melanin hyperpigmentation: Secondary | ICD-10-CM | POA: Diagnosis not present

## 2017-04-12 DIAGNOSIS — L821 Other seborrheic keratosis: Secondary | ICD-10-CM | POA: Diagnosis not present

## 2017-04-12 DIAGNOSIS — L570 Actinic keratosis: Secondary | ICD-10-CM | POA: Diagnosis not present

## 2017-04-12 DIAGNOSIS — Z85828 Personal history of other malignant neoplasm of skin: Secondary | ICD-10-CM | POA: Diagnosis not present

## 2017-04-20 ENCOUNTER — Other Ambulatory Visit: Payer: Self-pay | Admitting: Family Medicine

## 2017-04-21 NOTE — Telephone Encounter (Signed)
Last seen 03/04/17  Dr Livia Snellen  If approved  Route to nurse to call into [facility]

## 2017-04-25 NOTE — Telephone Encounter (Signed)
rx called into pharmacy voicemail.

## 2017-06-24 ENCOUNTER — Encounter (INDEPENDENT_AMBULATORY_CARE_PROVIDER_SITE_OTHER): Payer: Self-pay | Admitting: Internal Medicine

## 2017-06-24 ENCOUNTER — Encounter (INDEPENDENT_AMBULATORY_CARE_PROVIDER_SITE_OTHER): Payer: Self-pay

## 2017-07-08 ENCOUNTER — Other Ambulatory Visit: Payer: Self-pay | Admitting: Family Medicine

## 2017-07-08 NOTE — Telephone Encounter (Signed)
OV 07/22/17

## 2017-07-09 ENCOUNTER — Other Ambulatory Visit: Payer: Self-pay | Admitting: Family Medicine

## 2017-07-12 NOTE — Telephone Encounter (Signed)
Rx called to pharmacy

## 2017-07-22 ENCOUNTER — Ambulatory Visit (INDEPENDENT_AMBULATORY_CARE_PROVIDER_SITE_OTHER): Payer: Medicare Other | Admitting: Family Medicine

## 2017-07-22 ENCOUNTER — Encounter: Payer: Self-pay | Admitting: Family Medicine

## 2017-07-22 VITALS — BP 110/76 | HR 78 | Temp 97.3°F | Ht 66.0 in | Wt 161.0 lb

## 2017-07-22 DIAGNOSIS — F419 Anxiety disorder, unspecified: Secondary | ICD-10-CM | POA: Diagnosis not present

## 2017-07-22 DIAGNOSIS — K2100 Gastro-esophageal reflux disease with esophagitis, without bleeding: Secondary | ICD-10-CM

## 2017-07-22 DIAGNOSIS — Z23 Encounter for immunization: Secondary | ICD-10-CM

## 2017-07-22 DIAGNOSIS — M810 Age-related osteoporosis without current pathological fracture: Secondary | ICD-10-CM

## 2017-07-22 MED ORDER — LORAZEPAM 1 MG PO TABS: 1.0000 mg | ORAL_TABLET | Freq: Four times a day (QID) | ORAL | 5 refills | Status: DC | PRN

## 2017-07-22 MED ORDER — LISINOPRIL 10 MG PO TABS: 10.0000 mg | ORAL_TABLET | Freq: Every day | ORAL | 5 refills | Status: DC

## 2017-07-22 MED ORDER — HYDROCHLOROTHIAZIDE 25 MG PO TABS: ORAL_TABLET | ORAL | 5 refills | Status: DC

## 2017-07-22 NOTE — Patient Instructions (Signed)
Decrease Gabapentin to 2 everyday x 7 days then 1 everyday x 1 week and then discontinue.  Drink 1 can of ensure if you miss a meal.

## 2017-07-22 NOTE — Progress Notes (Signed)
Subjective:  Patient ID: Cameron Atkins, male    DOB: 20-Feb-1946  Age: 71 y.o. MRN: [id number]  CC: No chief complaint on file.   HPI JAHEIM CANINO presents for Patient in for follow-up of GERD. Currently asymptomatic taking  PPI daily. There is no chest pain or heartburn. No hematemesis and no melena. No dysphagia or choking. Onset is remote. Progression is stable. Complicating factors, none.Patient is encouraged concerned about his anxiety. He says occasionally he'll take an extra Xanax. Just however when he gets a sense of panic and feels like his hiatal hernia is pressing up on his chest and making it hard for him to breathe. Otherwise he drinks a beer every day and says that helps him relax. He is concerned that his appetite has diminished. However, he is maintaining his weight.   Depression screen [facility] 2/9 07/22/2017 03/04/2017  Decreased Interest 0 0  Down, Depressed, Hopeless 0 0  PHQ - 2 Score 0 0    History Willet has a past medical history of Anxiety; Arthritis; Bladder stone; Depression; Diverticulosis of colon; GERD (gastroesophageal reflux disease); H/O hiatal hernia; Helicobacter pylori gastritis; History of colonoscopy with polypectomy; History of esophageal dilatation; Hypertension; Left ureteral calculus; and Wears glasses.   He has a past surgical history that includes Esophagogastroduodenoscopy (N/A, 03/21/2013); Esophageal dilation (03/21/2013); Esophagogastroduodenoscopy (egd) with esophageal dilation (N/A, 04/26/2013); Extracorporeal shock wave lithotripsy (X2   YRS AGO); Shoulder arthroscopy w/ subacromial decompression and distal clavicle excision (Left, 2000); CYSTO/  URETEROSCOPIC LITHOTRIPSY/ STONE EXTRACTION (1995); Cystoscopy with retrograde pyelogram, ureteroscopy and stent placement (Left, 01/15/2014); Holmium laser application (Left, 03/20/7627); Cystoscopy w/ retrogrades (Right, 01/15/2014); Esophagogastroduodenoscopy (egd) with propofol (N/A, 09/10/2016); Esophageal  dilation (N/A, 09/10/2016); Colonoscopy with propofol (N/A, 09/10/2016); and biopsy (09/10/2016).   His family history is not on file.He reports that he quit smoking about 48 years ago. His smoking use included Cigarettes. He has a 0.50 pack-year smoking history. He quit smokeless tobacco use about 18 years ago. His smokeless tobacco use included Chew. He reports that he drinks about 12.6 oz of alcohol per week . He reports that he does not use drugs.    ROS Review of Systems  Constitutional: Negative for chills, diaphoresis, fever and unexpected weight change.  HENT: Negative for congestion, hearing loss, rhinorrhea and sore throat.   Eyes: Negative for visual disturbance.  Respiratory: Negative for cough and shortness of breath.   Cardiovascular: Negative for chest pain.  Gastrointestinal: Positive for abdominal pain. Negative for constipation and diarrhea.  Genitourinary: Negative for dysuria and flank pain.  Musculoskeletal: Negative for arthralgias and joint swelling.  Skin: Negative for rash.  Neurological: Negative for dizziness and headaches.  Psychiatric/Behavioral: Negative for confusion, dysphoric mood and sleep disturbance. The patient is nervous/anxious.     Objective:  BP 110/76   Pulse 78   Temp (!) 97.3 F (36.3 C) (Oral)   Ht 5\' 6"  (1.676 m)   Wt 161 lb (73 kg)   BMI 25.99 kg/m   BP Readings from Last 3 Encounters:  07/22/17 110/76  03/04/17 123/69  09/10/16 119/83    Wt Readings from Last 3 Encounters:  07/22/17 161 lb (73 kg)  03/04/17 162 lb (73.5 kg)  09/06/16 157 lb (71.2 kg)     Physical Exam  Constitutional: He is oriented to person, place, and time. He appears well-developed and well-nourished. No distress.  HENT:  Head: Normocephalic and atraumatic.  Right Ear: External ear normal.  Left Ear: External ear normal.  Nose: Nose normal.  Mouth/Throat: Oropharynx is clear and moist.  Eyes: Pupils are equal, round, and reactive to light.  Conjunctivae and EOM are normal.  Neck: Normal range of motion. Neck supple. No thyromegaly present.  Cardiovascular: Normal rate, regular rhythm and normal heart sounds.   No murmur heard. Pulmonary/Chest: Effort normal and breath sounds normal. No respiratory distress. He has no wheezes. He has no rales.  Abdominal: Soft. Bowel sounds are normal. He exhibits no distension. There is no tenderness.  Musculoskeletal: Normal range of motion.  Lymphadenopathy:    He has no cervical adenopathy.  Neurological: He is alert and oriented to person, place, and time. He has normal reflexes.  Skin: Skin is warm and dry.  Psychiatric: He has a normal mood and affect. His behavior is normal. Judgment and thought content normal.      Assessment & Plan:   Diagnoses and all orders for this visit:  Gastroesophageal reflux disease with esophagitis  Age-related osteoporosis without current pathological fracture  Anxiety  Other orders -     hydrochlorothiazide (HYDRODIURIL) 25 MG tablet; TAKE 1 TABLET BY MOUTH ONCE A DAILY -     lisinopril (PRINIVIL,ZESTRIL) 10 MG tablet; Take 1 tablet (10 mg total) by mouth daily. -     LORazepam (ATIVAN) 1 MG tablet; Take 1 tablet (1 mg total) by mouth 4 (four) times daily as needed.       I have changed Mr. Stgermaine lisinopril and LORazepam. I am also having him maintain his fish oil-omega-3 fatty acids, cimetidine, calcium citrate-vitamin D, multivitamin, Vitamin D3, pantoprazole, hydrOXYzine, gabapentin, and hydrochlorothiazide.  Allergies as of 07/22/2017      Reactions   Benadryl [diphenhydramine] Other (See Comments)   anxious   Mepergan [meperidine-promethazine]    Vomiting.    Other    Antidepressants--per patient he cannot take.   Oxycodone Itching   hyperactivity   Amoxicillin    Has patient had a PCN reaction causing immediate rash, facial/tongue/throat swelling, SOB or lightheadedness with hypotension:No Has patient had a PCN reaction  causing severe rash involving mucus membranes or skin necrosis:No Has patient had a PCN reaction that required hospitalization:No Has patient had a PCN reaction occurring within the last 10 years:Yes If all of the above answers are "NO", then may proceed with Cephalosporin use. "severe anxiety-ativan does not remedy"   Ciprofloxacin    "anxious"   Doxycycline Anxiety   Flagyl [metronidazole] Anxiety   Singulair [montelukast Sodium] Anxiety      Medication List       Accurate as of 07/22/17  2:38 PM. Always use your most recent med list.          calcium citrate-vitamin D 315-200 MG-UNIT tablet Commonly known as:  CITRACAL+D Take 1 tablet by mouth daily.   cimetidine 400 MG tablet Commonly known as:  TAGAMET Take 1 tablet (400 mg total) by mouth daily as needed.   fish oil-omega-3 fatty acids 1000 MG capsule Take 2 g by mouth daily after breakfast.   gabapentin 600 MG tablet Commonly known as:  NEURONTIN Take 1 tablet (600 mg total) by mouth 5 (five) times daily.   hydrochlorothiazide 25 MG tablet Commonly known as:  HYDRODIURIL TAKE 1 TABLET BY MOUTH ONCE A DAILY   hydrOXYzine 25 MG capsule Commonly known as:  VISTARIL TAKE 1 CAPSULE BY MOUTH THREE TIMES DAILY AS NEEDED FOR ANXIETY   lisinopril 10 MG tablet Commonly known as:  PRINIVIL,ZESTRIL Take 1 tablet (10 mg total) by mouth daily.  LORazepam 1 MG tablet Commonly known as:  ATIVAN Take 1 tablet (1 mg total) by mouth 4 (four) times daily as needed.   multivitamin tablet Take 1 tablet by mouth daily.   pantoprazole 40 MG tablet Commonly known as:  PROTONIX TAKE 1 TABLET BY MOUTH EVERY MORNING   Vitamin D3 3000 units Tabs Take 3,000 Units by mouth daily.        Follow-up: Return in about 6 months (around 01/20/2018).  Claretta Fraise, M.D.

## 2017-08-08 ENCOUNTER — Other Ambulatory Visit: Payer: Self-pay | Admitting: Family Medicine

## 2017-08-23 DIAGNOSIS — R972 Elevated prostate specific antigen [PSA]: Secondary | ICD-10-CM | POA: Diagnosis not present

## 2017-08-29 DIAGNOSIS — N401 Enlarged prostate with lower urinary tract symptoms: Secondary | ICD-10-CM | POA: Diagnosis not present

## 2017-08-29 DIAGNOSIS — N200 Calculus of kidney: Secondary | ICD-10-CM | POA: Diagnosis not present

## 2017-08-29 DIAGNOSIS — N281 Cyst of kidney, acquired: Secondary | ICD-10-CM | POA: Diagnosis not present

## 2017-08-29 DIAGNOSIS — R3912 Poor urinary stream: Secondary | ICD-10-CM | POA: Diagnosis not present

## 2017-09-06 ENCOUNTER — Ambulatory Visit (INDEPENDENT_AMBULATORY_CARE_PROVIDER_SITE_OTHER): Payer: Medicare Other | Admitting: Internal Medicine

## 2017-09-16 ENCOUNTER — Other Ambulatory Visit: Payer: Self-pay | Admitting: Family Medicine

## 2017-09-21 NOTE — Telephone Encounter (Signed)
Last seen 07/22/17  Dr Livia Snellen

## 2017-10-24 ENCOUNTER — Ambulatory Visit (INDEPENDENT_AMBULATORY_CARE_PROVIDER_SITE_OTHER): Payer: Medicare Other | Admitting: Internal Medicine

## 2017-10-24 ENCOUNTER — Other Ambulatory Visit (INDEPENDENT_AMBULATORY_CARE_PROVIDER_SITE_OTHER): Payer: Self-pay | Admitting: Internal Medicine

## 2017-10-24 ENCOUNTER — Encounter (INDEPENDENT_AMBULATORY_CARE_PROVIDER_SITE_OTHER): Payer: Self-pay | Admitting: Internal Medicine

## 2017-10-24 ENCOUNTER — Encounter (INDEPENDENT_AMBULATORY_CARE_PROVIDER_SITE_OTHER): Payer: Self-pay | Admitting: *Deleted

## 2017-10-24 VITALS — BP 140/82 | HR 70 | Temp 98.1°F | Resp 18 | Ht 66.0 in | Wt 164.2 lb

## 2017-10-24 DIAGNOSIS — R6889 Other general symptoms and signs: Secondary | ICD-10-CM | POA: Diagnosis not present

## 2017-10-24 DIAGNOSIS — K219 Gastro-esophageal reflux disease without esophagitis: Secondary | ICD-10-CM

## 2017-10-24 DIAGNOSIS — K449 Diaphragmatic hernia without obstruction or gangrene: Secondary | ICD-10-CM

## 2017-10-24 LAB — TSH: TSH: 1.43 mIU/L (ref 0.40–4.50)

## 2017-10-24 NOTE — Progress Notes (Signed)
Presenting complaint;  Follow-up for GERD and dysphagia. Patient complains of intolerance to cold.  Database and subjective:  Patient is 72 year old Caucasian male who is here for scheduled visit.  He was last seen on 08/03/2016.  He had EGD with dilation and colonoscopy in December 2017. EGD revealed Schatzki's ring and hiatal hernia.  Esophagus was dilated with 54 French Maloney dilator.  Colonoscopy revealed sigmoid colon diverticulosis and a small rectal polyp which was hyperplastic.  He also had internal hemorrhoids.  Patient is here for scheduled visit.  He has multiple complaints. He does not have dysphagia with foods but he had with pills.  He states he used to swallow multiple pills at the same time but unable to do so.  He also complains of intermittent lump in his throat.  He is also having problems with his right tonsil.  He states he forms a small stone and he has not been able to dislodge it.  He has been using [employer].  He has been evaluated by ENT surgeons in the past and has been told that tonsillectomy would be high risk.  He denies abdominal pain melena or rectal bleeding.  States his bowels move daily.  He states he does not have a good appetite.  Review of weight record reveals that he has gained 10 pounds.  He states he does not understand why he has gained weight.  He drinks 1 can of Ensure daily.  He used to drink 2 cans of beer a day and now he only drinks 1 can daily.  He also complains of feeling cold all the time. He also complains of intermittent dyspnea after exertion and meals and he wonders if it is due to GERD symptoms and hiatal hernia or anxiety. He states he takes gabapentin for depression.  He says he was tried on other medications and did not get any relief.   Current Medications: Outpatient Encounter Medications as of 10/24/2017  Medication Sig  . calcium citrate-vitamin D (CITRACAL+D) 315-200 MG-UNIT per tablet Take 1 tablet by mouth daily.   .  Cholecalciferol (VITAMIN D3) 3000 units TABS Take 3,000 Units by mouth daily.  . cimetidine (TAGAMET) 400 MG tablet Take 1 tablet (400 mg total) by mouth daily as needed. (Patient taking differently: Take 200 mg by mouth at bedtime as needed (for heartburn/reflux.). )  . fish oil-omega-3 fatty acids 1000 MG capsule Take 2 g by mouth daily after breakfast.   . gabapentin (NEURONTIN) 600 MG tablet TAKE 1 TABLET BY MOUTH FIVE TIMES DAILY  . hydrochlorothiazide (HYDRODIURIL) 25 MG tablet TAKE 1 TABLET BY MOUTH ONCE A DAILY  . lisinopril (PRINIVIL,ZESTRIL) 10 MG tablet Take 1 tablet (10 mg total) by mouth daily.  Marland Kitchen LORazepam (ATIVAN) 1 MG tablet Take 1 tablet (1 mg total) by mouth 4 (four) times daily as needed.  . Multiple Vitamin (MULTIVITAMIN) tablet Take 1 tablet by mouth daily.  . pantoprazole (PROTONIX) 40 MG tablet TAKE 1 TABLET BY MOUTH EVERY MORNING  . hydrOXYzine (VISTARIL) 25 MG capsule TAKE ONE CAPSULE BY MOUTH THREE TIMES DAILY AS NEEDED FOR ANXIETY (Patient not taking: Reported on 10/24/2017)   No facility-administered encounter medications on file as of 10/24/2017.      Objective: Blood pressure 140/82, pulse 70, temperature 98.1 F (36.7 C), temperature source Oral, resp. rate 18, height 5\' 6"  (1.676 m), weight 164 lb 3.2 oz (74.5 kg). Patient is alert and in no acute distress. Conjunctiva is pink. Sclera is nonicteric Oropharyngeal mucosa is normal.  Suspect of whitish material noted over right tonsil which is mostly hidden and hard to see. No neck masses or thyromegaly noted. Cardiac exam with regular rhythm normal S1 and S2. No murmur or gallop noted. Lungs are clear to auscultation. Abdomen is symmetrical soft and nontender without organomegaly or masses. No LE edema or clubbing noted.   Assessment:  #1.  Chronic GERD.  Heartburn is well controlled but he is having feeling of lump in his throat and intermittent dyspnea.  Doubt that his symptoms are due to enlarging hiatal  hernia.  Will obtain upper GI series to answer this question for patient satisfaction.   #2.  Hiatal hernia.  EGD in February 2017 revealed moderate size sliding hiatal hernia.  Need to find out if his hernias increase in size causing some of his symptoms.  #3.  Intolerance to cold.  He does not have a good appetite and has gained 10 pounds.  Therefore need to rule out hypothyroidism.   Plan:  Continue pantoprazole at current dose of 40 mg by mouth 30 minutes before breakfast daily. UGI series. Patient will go to lab for TSH. Further recommendations to follow. Office visit in 1 year or earlier if needed.

## 2017-10-24 NOTE — Patient Instructions (Signed)
Upper GI series to be scheduled. Physician will call with results of blood test and upper GI series when completed.

## 2017-10-28 ENCOUNTER — Ambulatory Visit (HOSPITAL_COMMUNITY)
Admission: RE | Admit: 2017-10-28 | Discharge: 2017-10-28 | Disposition: A | Discharge status: Home / Self Care | Payer: Medicare Other | Source: Ambulatory Visit | Attending: Internal Medicine | Admitting: Internal Medicine

## 2017-10-28 ENCOUNTER — Ambulatory Visit (INDEPENDENT_AMBULATORY_CARE_PROVIDER_SITE_OTHER): Payer: Medicare Other | Admitting: Pediatrics

## 2017-10-28 ENCOUNTER — Encounter: Payer: Self-pay | Admitting: Pediatrics

## 2017-10-28 VITALS — BP 134/78 | HR 73 | Temp 98.2°F | Ht 66.0 in | Wt 168.4 lb

## 2017-10-28 DIAGNOSIS — N200 Calculus of kidney: Secondary | ICD-10-CM | POA: Insufficient documentation

## 2017-10-28 DIAGNOSIS — K219 Gastro-esophageal reflux disease without esophagitis: Secondary | ICD-10-CM | POA: Diagnosis not present

## 2017-10-28 DIAGNOSIS — K2100 Gastro-esophageal reflux disease with esophagitis, without bleeding: Secondary | ICD-10-CM

## 2017-10-28 DIAGNOSIS — F419 Anxiety disorder, unspecified: Secondary | ICD-10-CM

## 2017-10-28 DIAGNOSIS — K449 Diaphragmatic hernia without obstruction or gangrene: Secondary | ICD-10-CM

## 2017-10-28 NOTE — Progress Notes (Signed)
  Subjective:   Patient ID: Cameron Atkins, male    DOB: 1945/12/29, 72 y.o.   MRN: [id number] CC: Establish Care follow up med problems HPI: Cameron Atkins is a 72 y.o. male presenting for Establish Care  A few days ago while using new water pick, tried to get tonsil stone out R side, thinks he turned it too high, saw some blood, got very tender the next day No fevers Doesn't think he got the tonsil stone out Doesn't feel the tonsil stone  No foul breath Says he sees it, wants it out, doesn't want to have tonsil surgery  Now throat no longer hurts, pain stopped yesterday  GERD: takes pantoprazole daily for reflux symptoms Still bothers him at times Following with GI  Anxiety: takes ativan 4 times a day Says he has been on this as treatment for years, that he has tried many other medications for anxiety and that none of them worked  Relevant past medical, surgical, family and social history reviewed. Allergies and medications reviewed and updated. Social History   Tobacco Use  Smoking Status Former Smoker  . Packs/day: 0.25  . Years: 2.00  . Pack years: 0.50  . Types: Cigarettes  . Last attempt to quit: 01/14/1969  . Years since quitting: 27.8  Smokeless Tobacco Former Systems developer  . Types: Chew  . Quit date: 01/15/1999   ROS: Per HPI   Objective:    BP 134/78   Pulse 73   Temp 98.2 F (36.8 C) (Oral)   Ht 5\' 6"  (1.676 m)   Wt 168 lb 6.4 oz (76.4 kg)   BMI 27.18 kg/m   Wt Readings from Last 3 Encounters:  10/28/17 168 lb 6.4 oz (76.4 kg)  10/24/17 164 lb 3.2 oz (74.5 kg)  07/22/17 161 lb (73 kg)    Gen: NAD, alert, cooperative with exam, NCAT EYES: EOMI, no conjunctival injection, or no icterus ENT:  TMs pearly gray b/l, OP without erythema, normal appearing tonsils b/l, no crypts seen LYMPH: no cervical LAD CV: NRRR, normal S1/S2, no murmur, distal pulses 2+ b/l Resp: CTABL, no wheezes, normal WOB Abd: +BS, soft, NTND. no guarding or organomegaly Ext: No edema,  warm Neuro: Alert and oriented, strength equal b/l UE and LE, coordination grossly normal MSK: normal muscle bulk Psych: increased psychomotor agitation, anxious  Assessment & Plan:  Parv was seen today for establish care, follow up medical problems and sore throat  Diagnoses and all orders for this visit:  Gastroesophageal reflux disease with esophagitis Cont PPI, avoid exacerbating foods  Anxiety Ongoing symptoms Pt very hesitant to start SSRI, wants to continue ativan Discussed this is not ideal as only treatment for anxiety He agrees to think about starting anxiety controller medication  Sore throat Improved No tonsil stones on exam Offered referral to ENT, pt declined for now   I spent 27 minutes with the patient with over 50% of the encounter time dedicated to counseling on the above problems.   Follow up plan: Return in about 2 months (around 12/26/2017). Assunta Found, MD [place]

## 2017-11-07 DIAGNOSIS — R070 Pain in throat: Secondary | ICD-10-CM | POA: Diagnosis not present

## 2017-11-10 ENCOUNTER — Other Ambulatory Visit: Payer: Self-pay | Admitting: Family Medicine

## 2017-11-10 NOTE — Telephone Encounter (Signed)
Last seen 10/28/17 Dr Evette Doffing  Dr Livia Snellen PCP

## 2017-11-10 NOTE — Telephone Encounter (Signed)
He has upcoming appt, will discuss refill then.

## 2017-11-27 ENCOUNTER — Other Ambulatory Visit: Payer: Self-pay | Admitting: Family Medicine

## 2017-12-05 ENCOUNTER — Other Ambulatory Visit: Payer: Self-pay | Admitting: *Deleted

## 2017-12-05 MED ORDER — LISINOPRIL 10 MG PO TABS: 10.0000 mg | ORAL_TABLET | Freq: Every day | ORAL | 1 refills | Status: DC

## 2017-12-17 ENCOUNTER — Other Ambulatory Visit: Payer: Self-pay | Admitting: Family Medicine

## 2017-12-22 ENCOUNTER — Emergency Department (HOSPITAL_COMMUNITY)
Admission: EM | Admit: 2017-12-22 | Discharge: 2017-12-22 | Disposition: A | Discharge status: Home / Self Care | Payer: Medicare Other | Attending: Emergency Medicine | Admitting: Emergency Medicine

## 2017-12-22 ENCOUNTER — Emergency Department (HOSPITAL_COMMUNITY): Payer: Medicare Other

## 2017-12-22 ENCOUNTER — Encounter (HOSPITAL_COMMUNITY): Payer: Self-pay | Admitting: Emergency Medicine

## 2017-12-22 DIAGNOSIS — Z79899 Other long term (current) drug therapy: Secondary | ICD-10-CM | POA: Diagnosis not present

## 2017-12-22 DIAGNOSIS — Y929 Unspecified place or not applicable: Secondary | ICD-10-CM | POA: Insufficient documentation

## 2017-12-22 DIAGNOSIS — R0602 Shortness of breath: Secondary | ICD-10-CM | POA: Diagnosis not present

## 2017-12-22 DIAGNOSIS — Z87891 Personal history of nicotine dependence: Secondary | ICD-10-CM | POA: Insufficient documentation

## 2017-12-22 DIAGNOSIS — I10 Essential (primary) hypertension: Secondary | ICD-10-CM | POA: Insufficient documentation

## 2017-12-22 DIAGNOSIS — W11XXXA Fall on and from ladder, initial encounter: Secondary | ICD-10-CM | POA: Diagnosis not present

## 2017-12-22 DIAGNOSIS — S0003XA Contusion of scalp, initial encounter: Secondary | ICD-10-CM | POA: Diagnosis not present

## 2017-12-22 DIAGNOSIS — W19XXXA Unspecified fall, initial encounter: Secondary | ICD-10-CM

## 2017-12-22 DIAGNOSIS — S0990XA Unspecified injury of head, initial encounter: Secondary | ICD-10-CM

## 2017-12-22 DIAGNOSIS — Y9389 Activity, other specified: Secondary | ICD-10-CM | POA: Insufficient documentation

## 2017-12-22 DIAGNOSIS — Y999 Unspecified external cause status: Secondary | ICD-10-CM | POA: Diagnosis not present

## 2017-12-22 DIAGNOSIS — R079 Chest pain, unspecified: Secondary | ICD-10-CM | POA: Diagnosis not present

## 2017-12-22 DIAGNOSIS — R0789 Other chest pain: Secondary | ICD-10-CM | POA: Insufficient documentation

## 2017-12-22 DIAGNOSIS — S199XXA Unspecified injury of neck, initial encounter: Secondary | ICD-10-CM | POA: Diagnosis not present

## 2017-12-22 DIAGNOSIS — S299XXA Unspecified injury of thorax, initial encounter: Secondary | ICD-10-CM | POA: Diagnosis not present

## 2017-12-22 DIAGNOSIS — S40012A Contusion of left shoulder, initial encounter: Secondary | ICD-10-CM

## 2017-12-22 HISTORY — DX: Age-related osteoporosis without current pathological fracture: M81.0

## 2017-12-22 MED ORDER — FENTANYL CITRATE (PF) 100 MCG/2ML IJ SOLN
50.0000 ug | Freq: Once | INTRAMUSCULAR | Status: AC
Start: 2017-12-22 — End: 2017-12-22
  Administered 2017-12-22: 50 ug via INTRAVENOUS
  Filled 2017-12-22: qty 2

## 2017-12-22 MED ORDER — TRAMADOL HCL 50 MG PO TABS
100.0000 mg | ORAL_TABLET | Freq: Once | ORAL | Status: AC
  Administered 2017-12-22: 100 mg via ORAL
  Filled 2017-12-22: qty 2

## 2017-12-22 MED ORDER — TRAMADOL HCL 50 MG PO TABS: 50.0000 mg | ORAL_TABLET | Freq: Four times a day (QID) | ORAL | 0 refills | Status: DC | PRN

## 2017-12-22 MED ORDER — NAPROXEN 500 MG PO TABS: 500.0000 mg | ORAL_TABLET | Freq: Two times a day (BID) | ORAL | 0 refills | Status: DC

## 2017-12-22 NOTE — ED Notes (Signed)
Pt had 20g iv to left hand that was removed prior to dc. Bleeding controlled. nad

## 2017-12-22 NOTE — ED Triage Notes (Signed)
Pt fell out of a barn loft backwards and hit the ground head first per wife.  Pt c/o left shoulder pain and back pain.  C collar applied on arrival.  Pt denies neuro deficits and is alert and oriented.  Unsure if LOC.

## 2017-12-22 NOTE — ED Provider Notes (Signed)
[facility] EMERGENCY DEPARTMENT Provider Note   CSN: [id number] Arrival date & time: 12/22/17  1525     History   Chief Complaint Chief Complaint  Patient presents with  . Fall    HPI Cameron Atkins is a 72 y.o. male.  Patient was working on a ladder approximately 7 feet in the air when he fell off backwards onto the ground.  He struck mainly his left shoulder upper back.  There was no LOC.  Initially he was stunned and could not move but eventually he was able to get up.  He is complaining of moderate sharp stabbing pain in his left shoulder left anterior chest left posterior chest and some lesser pain in his head and neck.  There is no numbness or tingling.  There is no abdominal pain.  He has some baseline shortness of breath but does not feel worse.  The history is provided by the patient.  Fall  This is a new problem. The current episode started less than 1 hour ago. The problem occurs constantly. The problem has been gradually improving. Associated symptoms include chest pain, headaches and shortness of breath. Pertinent negatives include no abdominal pain. The symptoms are aggravated by bending and twisting. The symptoms are relieved by rest. He has tried nothing for the symptoms. The treatment provided mild relief.    Past Medical History:  Diagnosis Date  . Anxiety   . Arthritis   . Bladder stone   . Depression   . Diverticulosis of colon   . GERD (gastroesophageal reflux disease)   . H/O hiatal hernia   . Helicobacter pylori gastritis   . History of colonoscopy with polypectomy    TUBULAR ADENOMA-- 2012  . History of esophageal dilatation    STRICTURE--  LAST DONE 03/2013  . Hypertension    for 5 yrs  . Left ureteral calculus   . Osteoporosis   . Wears glasses     Patient Active Problem List   Diagnosis Date Noted  . Esophageal stricture 08/04/2016  . History of colonic polyps 08/04/2016  . GERD (gastroesophageal reflux disease) 06/26/2013  . Osteoporosis  06/26/2013  . Anxiety 06/26/2013    Past Surgical History:  Procedure Laterality Date  . BIOPSY  09/10/2016   Procedure: BIOPSY;  Surgeon: Rogene Houston, MD;  [facility];  Service: Endoscopy;;  rectal polyp  . COLONOSCOPY WITH PROPOFOL N/A 09/10/2016   Procedure: COLONOSCOPY WITH PROPOFOL;  Surgeon: Rogene Houston, MD;  [facility]: [facility];  Service: Endoscopy;  Laterality: N/A;  . CYSTO/  URETEROSCOPIC LITHOTRIPSY/ STONE EXTRACTION  1995  . CYSTOSCOPY W/ RETROGRADES Right 01/15/2014   Procedure: CYSTOSCOPY WITH RIGHT RETROGRADE PYELOGRAM;  Surgeon: Irine Seal, MD;  Location: [facility];  Service: Urology;  Laterality: Right;  . CYSTOSCOPY WITH RETROGRADE PYELOGRAM, URETEROSCOPY AND STENT PLACEMENT Left 01/15/2014   Procedure: CYSTOSCOPY , LEFT RETROGRADE PYELOGRAM, LEFT URETEROSCOPY, BASKET STONE EXTRACTION;  Surgeon: Irine Seal, MD;  Location: [facility]  Service: Urology;  Laterality: Left;  . ESOPHAGEAL DILATION  03/21/2013   Procedure: ESOPHAGEAL DILATION;  Surgeon: Danie Binder, MD;  [facility];  Service: Endoscopy;;  . ESOPHAGEAL DILATION N/A 09/10/2016   Procedure: ESOPHAGEAL DILATION;  Surgeon: Rogene Houston, MD;  [facility];  Service: Endoscopy;  Laterality: N/A;  . ESOPHAGOGASTRODUODENOSCOPY N/A 03/21/2013   Procedure: ESOPHAGOGASTRODUODENOSCOPY (EGD);  Surgeon: Danie Binder, MD;  [facility];  Service: Endoscopy;  Laterality: N/A;  . ESOPHAGOGASTRODUODENOSCOPY (EGD) WITH ESOPHAGEAL DILATION N/A 04/26/2013   Procedure: ESOPHAGOGASTRODUODENOSCOPY (EGD) WITH ESOPHAGEAL DILATION;  Surgeon: Rogene Houston, MD;  [facility];  Service: Endoscopy;  Laterality: N/A;  130  . ESOPHAGOGASTRODUODENOSCOPY (EGD) WITH PROPOFOL N/A 09/10/2016   Procedure: ESOPHAGOGASTRODUODENOSCOPY (EGD) WITH PROPOFOL;  Surgeon: Rogene Houston, MD;  [facility];  Service: Endoscopy;  Laterality:  N/A;  10:20  . EXTRACORPOREAL SHOCK WAVE LITHOTRIPSY  X2   YRS AGO  . HOLMIUM LASER APPLICATION Left 7/51/0258   Procedure: HOLMIUM LASER APPLICATION, LEFT URETER;  Surgeon: Irine Seal, MD;  Location: [facility];  Service: Urology;  Laterality: Left;  . SHOULDER ARTHROSCOPY W/ SUBACROMIAL DECOMPRESSION AND DISTAL CLAVICLE EXCISION Left 2000        Home Medications    Prior to Admission medications   Medication Sig Start Date End Date Taking? Authorizing Provider  calcium citrate-vitamin D (CITRACAL+D) 315-200 MG-UNIT per tablet Take 1 tablet by mouth daily.     [provider]  Cholecalciferol (VITAMIN D3) 3000 units TABS Take 3,000 Units by mouth daily.    [provider]  cimetidine (TAGAMET) 400 MG tablet Take 1 tablet (400 mg total) by mouth daily as needed. Patient taking differently: Take 200 mg by mouth at bedtime as needed (for heartburn/reflux.).  06/26/13   Rehman, Mechele Dawley, MD  fish oil-omega-3 fatty acids 1000 MG capsule Take 2 g by mouth daily after breakfast.     [provider]  gabapentin (NEURONTIN) 600 MG tablet TAKE 1 TABLET BY MOUTH FIVE TIMES DAILY 12/19/17   Claretta Fraise, MD  hydrochlorothiazide (HYDRODIURIL) 25 MG tablet TAKE 1 TABLET BY MOUTH ONCE A DAILY 07/22/17   Claretta Fraise, MD  hydrOXYzine (VISTARIL) 25 MG capsule TAKE ONE CAPSULE BY MOUTH THREE TIMES DAILY AS NEEDED FOR ANXIETY 11/28/17   Claretta Fraise, MD  lisinopril (PRINIVIL,ZESTRIL) 10 MG tablet Take 1 tablet (10 mg total) by mouth daily. 12/05/17   Claretta Fraise, MD  LORazepam (ATIVAN) 1 MG tablet Take 1 tablet (1 mg total) by mouth 4 (four) times daily as needed. 07/22/17   Claretta Fraise, MD  Multiple Vitamin (MULTIVITAMIN) tablet Take 1 tablet by mouth daily.    [provider]  pantoprazole (PROTONIX) 40 MG tablet TAKE 1 TABLET BY MOUTH EVERY MORNING 01/27/17   Setzer, Rona Ravens, NP    Family History History reviewed. No pertinent family  history.  Social History Social History   Tobacco Use  . Smoking status: Former Smoker    Packs/day: 0.25    Years: 2.00    Pack years: 0.50    Types: Cigarettes    Last attempt to quit: 01/14/1969    Years since quitting: 48.9  . Smokeless tobacco: Former Systems developer    Types: Chew    Quit date: 01/15/1999  Substance Use Topics  . Alcohol use: Yes    Alcohol/week: 12.6 oz    Types: 21 Cans of beer per week    Comment: 1-2 beer daily  . Drug use: No     Allergies   Benadryl [diphenhydramine]; Mepergan [meperidine-promethazine]; Other; Oxycodone; Amoxicillin; Ciprofloxacin; Doxycycline; Flagyl [metronidazole]; and Singulair [montelukast sodium]   Review of Systems Review of Systems  Constitutional: Negative for chills and fever.  HENT: Negative for ear pain and sore throat.   Eyes: Negative for pain and visual disturbance.  Respiratory: Positive for shortness of breath. Negative for cough.   Cardiovascular: Positive for chest pain. Negative for palpitations.  Gastrointestinal: Negative for abdominal pain and vomiting.  Genitourinary: Negative for dysuria and hematuria.  Musculoskeletal: Positive for back pain and neck pain.  Skin: Negative for color change and rash.  Neurological: Positive for headaches. Negative for seizures and syncope.  All other systems reviewed and are negative.    Physical Exam Updated Vital Signs BP 131/76   Pulse 80   Temp 97.7 F (36.5 C) (Oral)   Resp 12   Ht 5\' 6"  (1.676 m)   Wt 71.2 kg (157 lb)   SpO2 97%   BMI 25.34 kg/m   Physical Exam  Constitutional: He is oriented to person, place, and time. He appears well-developed and well-nourished.  HENT:  Head: Normocephalic and atraumatic.  Eyes: Conjunctivae are normal.  Neck:  C-collar in place.  No step-offs.  Cardiovascular: Normal rate and regular rhythm.  No murmur heard. Pulses:      Radial pulses are 2+ on the right side, and 2+ on the left side.       Dorsalis pedis pulses  are 2+ on the right side, and 2+ on the left side.  Pulmonary/Chest: Effort normal and breath sounds normal. No respiratory distress.  Abdominal: Soft. There is no tenderness.  Musculoskeletal: He exhibits no edema.  He has some diffuse tenderness around his left shoulder increased pain with abduction and extension.  Some tenderness of the scapula.  Some upper thoracic tenderness diffuse.  Some anterior left chest wall tenderness.  No crepitus.  Full range of motion of all extremities without pain other than left shoulder limited by pain.  Lumbar spine nontender.  Pelvis stable.  Neurological: He is alert and oriented to person, place, and time. He has normal strength. No cranial nerve deficit or sensory deficit. GCS eye subscore is 4. GCS verbal subscore is 5. GCS motor subscore is 6.  Skin: Skin is warm and dry.  Psychiatric: He has a normal mood and affect.  Nursing note and vitals reviewed.    ED Treatments / Results  Labs (all labs ordered are listed, but only abnormal results are displayed) Labs Reviewed - No data to display  EKG None  Radiology Ct Head Wo Contrast  Result Date: 12/22/2017 CLINICAL DATA:  Fall from 7 feet EXAM: CT HEAD WITHOUT CONTRAST CT CERVICAL SPINE WITHOUT CONTRAST TECHNIQUE: Multidetector CT imaging of the head and cervical spine was performed following the standard protocol without intravenous contrast. Multiplanar CT image reconstructions of the cervical spine were also generated. COMPARISON:  None. FINDINGS: CT HEAD FINDINGS Brain: No mass lesion, intraparenchymal hemorrhage or extra-axial collection. No evidence of acute cortical infarct. Brain parenchyma and CSF-containing spaces are normal for age. Vascular: No hyperdense vessel or unexpected calcification. Skull: Small left parietal scalp hematoma. Sinuses/Orbits: No sinus fluid levels or advanced mucosal thickening. No mastoid effusion. Normal orbits. CT CERVICAL SPINE FINDINGS Alignment: No static  subluxation. Facets are aligned. Occipital condyles are normally positioned. Skull base and vertebrae: No acute fracture. Soft tissues and spinal canal: No prevertebral fluid or swelling. No visible canal hematoma. Disc levels: No advanced spinal canal or neural foraminal stenosis. Upper chest: No pneumothorax, pulmonary nodule or pleural effusion. Other: Normal visualized paraspinal cervical soft tissues. IMPRESSION: 1. No acute intracranial abnormality. 2. Small left parietal scalp hematoma. 3. No acute fracture or static subluxation of the cervical spine. Electronically Signed   By: Ulyses Jarred M.D.   On: 12/22/2017 18:43   Ct Chest Wo Contrast  Result Date: 12/22/2017 CLINICAL DATA:  Fall from height of  7 feet EXAM: CT CHEST WITHOUT CONTRAST TECHNIQUE: Multidetector CT imaging of the chest was performed following the standard protocol without IV contrast. COMPARISON:  CT abdomen pelvis 13 17 FINDINGS: Cardiovascular: Calcific aortic atherosclerosis. Mild cardiomegaly. No pericardial effusion. There is calcified aortic atherosclerosis. Mediastinum/Nodes: No mediastinal or axillary lymphadenopathy. Visualized thyroid gland is normal. Medium-sized hiatal hernia. Lungs/Pleura: No pneumothorax, pleural effusion or pulmonary contusion. There is bibasilar atelectasis. Upper Abdomen: No acute abnormality. Musculoskeletal: No chest wall mass or suspicious bone lesions identified. IMPRESSION: No acute thoracic abnormality. Aortic Atherosclerosis (ICD10-I70.0). Electronically Signed   By: Ulyses Jarred M.D.   On: 12/22/2017 18:56   Ct Cervical Spine Wo Contrast  Result Date: 12/22/2017 CLINICAL DATA:  Fall from 7 feet EXAM: CT HEAD WITHOUT CONTRAST CT CERVICAL SPINE WITHOUT CONTRAST TECHNIQUE: Multidetector CT imaging of the head and cervical spine was performed following the standard protocol without intravenous contrast. Multiplanar CT image reconstructions of the cervical spine were also generated. COMPARISON:   None. FINDINGS: CT HEAD FINDINGS Brain: No mass lesion, intraparenchymal hemorrhage or extra-axial collection. No evidence of acute cortical infarct. Brain parenchyma and CSF-containing spaces are normal for age. Vascular: No hyperdense vessel or unexpected calcification. Skull: Small left parietal scalp hematoma. Sinuses/Orbits: No sinus fluid levels or advanced mucosal thickening. No mastoid effusion. Normal orbits. CT CERVICAL SPINE FINDINGS Alignment: No static subluxation. Facets are aligned. Occipital condyles are normally positioned. Skull base and vertebrae: No acute fracture. Soft tissues and spinal canal: No prevertebral fluid or swelling. No visible canal hematoma. Disc levels: No advanced spinal canal or neural foraminal stenosis. Upper chest: No pneumothorax, pulmonary nodule or pleural effusion. Other: Normal visualized paraspinal cervical soft tissues. IMPRESSION: 1. No acute intracranial abnormality. 2. Small left parietal scalp hematoma. 3. No acute fracture or static subluxation of the cervical spine. Electronically Signed   By: Ulyses Jarred M.D.   On: 12/22/2017 18:43    Procedures Procedures (including critical care time)  Medications Ordered in ED Medications  fentaNYL (SUBLIMAZE) injection 50 mcg (has no administration in time range)     Initial Impression / Assessment and Plan / ED Course  I have reviewed the triage vital signs and the nursing notes.  Pertinent labs & imaging results that were available during my care of the patient were reviewed by me and considered in my medical decision making (see chart for details).  Clinical Course as of Dec 24 1716  Thu Dec 22, 2017  1914 Is negative for acute fracture by CT despite the chest x-ray being suspicious for some rib fractures and scapular fracture.  I reviewed these findings with the patient.  He had good pain relief with the fentanyl but has had problems with oral narcotics although he is not exactly sure which ones  were giving him troubles.  We are going to get him a sling and try some tramadol to see if that would help him.   [MB]    Clinical Course User Index [MB] Hayden Rasmussen, MD     Final Clinical Impressions(s) / ED Diagnoses   Final diagnoses:  Fall, initial encounter  Contusion of left shoulder, initial encounter  Minor head injury, initial encounter    ED Discharge Orders        Ordered    naproxen (NAPROSYN) 500 MG tablet  2 times daily     12/22/17 1924    traMADol (ULTRAM) 50 MG tablet  Every 6 hours PRN     12/22/17 1924  Hayden Rasmussen, MD 12/24/17 [phone]

## 2017-12-22 NOTE — Discharge Instructions (Addendum)
Your evaluated in the emergency department for a fall with injury to your head and left shoulder.  Your CAT scans did not show any obvious fracture.  We are giving you a splint to use as needed to help with pain.  Naprosyn and tramadol for pain.  Please call your doctor for follow-up and return to the emergency department for any worsening symptoms.  Also apply ice to the affected areas.

## 2017-12-26 ENCOUNTER — Ambulatory Visit (INDEPENDENT_AMBULATORY_CARE_PROVIDER_SITE_OTHER): Payer: Medicare Other | Admitting: Pediatrics

## 2017-12-26 ENCOUNTER — Encounter: Payer: Self-pay | Admitting: Pediatrics

## 2017-12-26 VITALS — BP 126/76 | HR 95 | Temp 97.3°F | Ht 66.0 in | Wt 163.0 lb

## 2017-12-26 DIAGNOSIS — I10 Essential (primary) hypertension: Secondary | ICD-10-CM

## 2017-12-26 DIAGNOSIS — F339 Major depressive disorder, recurrent, unspecified: Secondary | ICD-10-CM | POA: Diagnosis not present

## 2017-12-26 DIAGNOSIS — F411 Generalized anxiety disorder: Secondary | ICD-10-CM | POA: Diagnosis not present

## 2017-12-26 DIAGNOSIS — Z79899 Other long term (current) drug therapy: Secondary | ICD-10-CM | POA: Diagnosis not present

## 2017-12-26 MED ORDER — GABAPENTIN 600 MG PO TABS: 600.0000 mg | ORAL_TABLET | Freq: Three times a day (TID) | ORAL | 3 refills | Status: DC

## 2017-12-26 MED ORDER — MIRTAZAPINE 15 MG PO TABS: 15.0000 mg | ORAL_TABLET | Freq: Every day | ORAL | 3 refills | Status: DC

## 2017-12-26 MED ORDER — HYDROXYZINE PAMOATE 25 MG PO CAPS: ORAL_CAPSULE | ORAL | 2 refills | Status: DC

## 2017-12-26 MED ORDER — LORAZEPAM 1 MG PO TABS: 1.0000 mg | ORAL_TABLET | Freq: Four times a day (QID) | ORAL | 2 refills | Status: DC | PRN

## 2017-12-26 MED ORDER — HYDROCHLOROTHIAZIDE 25 MG PO TABS: ORAL_TABLET | ORAL | 5 refills | Status: DC

## 2017-12-26 NOTE — Progress Notes (Signed)
Subjective:   Patient ID: Cameron Atkins, male    DOB: 1946-01-27, 72 y.o.   MRN: [id number] CC: Follow-up (2 month)  HPI: Cameron Atkins is a 72 y.o. male presenting for Follow-up (2 month)  Here today with his wife  Describes times when his mood is down, sometimes feels like crying.  It might happen a few times a week, and might not happen in a week.  His wife confirms that she is been worried about his mood and about depression.  He says he been tried on many different medicines for depression anxiety in the past and they have not helped.  He will safe to self at home.  For anxiety he takes Ativan 4 times a day.  He is also been taking gabapentin about 3 times a day.  When he takes it 5 times a day as prescribed he feels sleepy and tired.  Recent fall off of a ladder.  Seen in the emergency room.  Records were reviewed.  Relevant past medical, surgical, family and social history reviewed. Allergies and medications reviewed and updated. Social History   Tobacco Use  Smoking Status Former Smoker  . Packs/day: 0.25  . Years: 2.00  . Pack years: 0.50  . Types: Cigarettes  . Last attempt to quit: 01/14/1969  . Years since quitting: 48.9  Smokeless Tobacco Former Systems developer  . Types: Chew  . Quit date: 01/15/1999   ROS: Per HPI   Objective:    BP 126/76   Pulse 95   Temp (!) 97.3 F (36.3 C) (Oral)   Ht 5\' 6"  (1.676 m)   Wt 163 lb (73.9 kg)   BMI 26.31 kg/m   Wt Readings from Last 3 Encounters:  12/26/17 163 lb (73.9 kg)  12/22/17 157 lb (71.2 kg)  10/28/17 168 lb 6.4 oz (76.4 kg)    Gen: NAD, alert, cooperative with exam, NCAT EYES: EOMI, no conjunctival injection, or no icterus CV: NRRR, normal S1/S2, no murmur, distal pulses 2+ b/l Resp: CTABL, no wheezes, normal WOB Abd: +BS, soft, NTND. no guarding or organomegaly Ext: No edema, warm Neuro: Alert and oriented, strength equal b/l UE and LE, coordination grossly normal MSK: normal muscle bulk  Assessment & Plan:    Cameron Atkins was seen today for follow-up multiple medical problems.  Diagnoses and all orders for this visit:  Depression, recurrent ([place]) -     mirtazapine (REMERON) 15 MG tablet; Take 1 tablet (15 mg total) by mouth at bedtime.  Generalized anxiety disorder Stable on below medicines.  We will add mirtazapine as above to treat mood disorder. -     gabapentin (NEURONTIN) 600 MG tablet; Take 1 tablet (600 mg total) by mouth 3 (three) times daily. -     hydrOXYzine (VISTARIL) 25 MG capsule; TAKE ONE CAPSULE BY MOUTH THREE TIMES DAILY AS NEEDED FOR ANXIETY -     LORazepam (ATIVAN) 1 MG tablet; Take 1 tablet (1 mg total) by mouth 4 (four) times daily as needed.  Essential hypertension Stable.  Continue current medicines. -     hydrochlorothiazide (HYDRODIURIL) 25 MG tablet; TAKE 1 TABLET BY MOUTH ONCE A DAILY  Chronic prescription benzodiazepine use Has been on Ativan 4 times a day he says for 35 years.  We will continue for now.  Continue to assess risk versus benefit.  I spent 40 minutes with the patient with over 50% of the encounter time dedicated to counseling on the above problems.   Follow up plan: Return in  about 2 months (around 02/25/2018). Assunta Found, MD [place]

## 2018-01-06 ENCOUNTER — Other Ambulatory Visit (INDEPENDENT_AMBULATORY_CARE_PROVIDER_SITE_OTHER): Payer: Self-pay | Admitting: Internal Medicine

## 2018-01-18 DIAGNOSIS — M19012 Primary osteoarthritis, left shoulder: Secondary | ICD-10-CM | POA: Diagnosis not present

## 2018-01-18 DIAGNOSIS — M25512 Pain in left shoulder: Secondary | ICD-10-CM | POA: Diagnosis not present

## 2018-01-23 ENCOUNTER — Ambulatory Visit: Payer: Medicare Other | Admitting: Family Medicine

## 2018-02-28 ENCOUNTER — Encounter: Payer: Self-pay | Admitting: Pediatrics

## 2018-02-28 ENCOUNTER — Ambulatory Visit (INDEPENDENT_AMBULATORY_CARE_PROVIDER_SITE_OTHER): Payer: Medicare Other | Admitting: Pediatrics

## 2018-02-28 VITALS — BP 134/84 | HR 64 | Temp 97.2°F | Ht 66.0 in | Wt 157.6 lb

## 2018-02-28 DIAGNOSIS — D649 Anemia, unspecified: Secondary | ICD-10-CM | POA: Diagnosis not present

## 2018-02-28 DIAGNOSIS — R6889 Other general symptoms and signs: Secondary | ICD-10-CM | POA: Diagnosis not present

## 2018-02-28 DIAGNOSIS — F339 Major depressive disorder, recurrent, unspecified: Secondary | ICD-10-CM | POA: Diagnosis not present

## 2018-02-28 DIAGNOSIS — F411 Generalized anxiety disorder: Secondary | ICD-10-CM | POA: Diagnosis not present

## 2018-02-28 DIAGNOSIS — Z1159 Encounter for screening for other viral diseases: Secondary | ICD-10-CM

## 2018-02-28 DIAGNOSIS — Z79899 Other long term (current) drug therapy: Secondary | ICD-10-CM

## 2018-02-28 DIAGNOSIS — G2581 Restless legs syndrome: Secondary | ICD-10-CM | POA: Diagnosis not present

## 2018-02-28 MED ORDER — LORAZEPAM 1 MG PO TABS: 1.0000 mg | ORAL_TABLET | Freq: Four times a day (QID) | ORAL | 2 refills | Status: DC | PRN

## 2018-02-28 MED ORDER — GABAPENTIN 600 MG PO TABS: 600.0000 mg | ORAL_TABLET | Freq: Three times a day (TID) | ORAL | 3 refills | Status: DC

## 2018-02-28 NOTE — Progress Notes (Signed)
Subjective:   Patient ID: Cameron Atkins, male    DOB: 18-Apr-1946, 72 y.o.   MRN: [id number] CC: Medical Management of Chronic Issues  HPI: Cameron Atkins is a 72 y.o. male  Here today with his wife Depression: Sleeping more than usual.  Says rarely he will start crying out of the blue.  Says this is less than once a week.  Taking Ativan 4 times a day.  Did not start the mirtazapine because he remembered being on it before after last office visit, made him feel more nervous.  Anxiety: Taking gabapentin 3 times a day.  Insomnia: Goes to sleep late, wakes up between 10:11 AM.  Often drinking Cokes and coffee throughout the day.  He says coffee in the middle the night calms him down and helps him sleep.  For last couple weeks has had restless legs at night.  Is never been a problem before.  Is not able to keep his legs still.  Feeling cold all the time.  Anemia: Last colonoscopy 2017, 5 yr recall, found to have internal hemorrhoids, diverticulosis, 1 small polyp.  Was able to walk 15 minutes a day as recently as about a month ago. Has been feeling more fatigued last few weeks so has cut back.  Did not need to stop to rest legs or for breathing problems.  Once when walking last week his legs felt tired and he turned around early.  Otherwise tolerates exercise well.  Has been using bicycle at home off and on.  Relevant past medical, surgical, family and social history reviewed. Allergies and medications reviewed and updated. Social History   Tobacco Use  Smoking Status Former Smoker  . Packs/day: 0.25  . Years: 2.00  . Pack years: 0.50  . Types: Cigarettes  . Last attempt to quit: 01/14/1969  . Years since quitting: 49.1  Smokeless Tobacco Former Systems developer  . Types: Chew  . Quit date: 01/15/1999   ROS: Per HPI   Objective:    BP 134/84   Pulse 64   Temp (!) 97.2 F (36.2 C) (Oral)   Ht 5' 6" (1.676 m)   Wt 157 lb 9.6 oz (71.5 kg)   BMI 25.44 kg/m   Wt Readings from Last 3  Encounters:  02/28/18 157 lb 9.6 oz (71.5 kg)  12/26/17 163 lb (73.9 kg)  12/22/17 157 lb (71.2 kg)   Gen: NAD, alert, cooperative with exam, NCAT EYES: EOMI, no conjunctival injection, or no icterus ENT:  TMs pearly gray b/l, OP without erythema CV: NRRR, normal S1/S2, no murmur, distal pulses 2+ b/l Resp: CTABL, no wheezes, normal WOB Abd: +BS, soft, NTND. no guarding or organomegaly Ext: No edema, warm bilateral feet. 2+ L DP pulse, diminished R DP pulse Neuro: Alert and oriented, strength equal b/l UE and LE, coordination grossly normal MSK: normal muscle bulk Psych: normal affect, no thoughts of self harm  Assessment & Plan:  Cameron Atkins was seen today for medical management of chronic issues.  Diagnoses and all orders for this visit:  Depression, recurrent ([place]) Ongoing symptoms.  Patient open to retrying the mirtazapine.  Generalized anxiety disorder Chronic prescription benzodiazepine use Some ongoing symptoms.  Have not been able to find anxiety controlling medicine that he is able to take.  Refilled below.  Patient must be seen for refills.  Dicussed decreasing caffeine intake in general, avoiding afternoon or later. -     gabapentin (NEURONTIN) 600 MG tablet; Take 1 tablet (600 mg total) by mouth 3 (  three) times daily. -     LORazepam (ATIVAN) 1 MG tablet; Take 1 tablet (1 mg total) by mouth 4 (four) times daily as needed.  Restless leg Check lab work.  Recent onset. -     CBC with Differential/Platelet -     BMP8+EGFR -     TSH  Cold intolerance -     CBC with Differential/Platelet -     BMP8+EGFR -     TSH  Encounter for hepatitis C screening test for low risk patient -     Hepatitis C antibody  Anemia, unspecified type -     Ferritin   Follow up plan: Return in about 1 month (around 03/28/2018). Assunta Found, MD [place]

## 2018-03-01 LAB — CBC WITH DIFFERENTIAL/PLATELET
BASOS ABS: 0 10*3/uL (ref 0.0–0.2)
Basos: 0 %
EOS (ABSOLUTE): 0.5 10*3/uL — AB (ref 0.0–0.4)
Eos: 8 %
Hematocrit: 37.5 % (ref 37.5–51.0)
Hemoglobin: 13.1 g/dL (ref 13.0–17.7)
Immature Grans (Abs): 0 10*3/uL (ref 0.0–0.1)
Immature Granulocytes: 0 %
LYMPHS ABS: 2.2 10*3/uL (ref 0.7–3.1)
LYMPHS: 36 %
MCH: 30.1 pg (ref 26.6–33.0)
MCHC: 34.9 g/dL (ref 31.5–35.7)
MCV: 86 fL (ref 79–97)
MONOCYTES: 11 %
Monocytes Absolute: 0.7 10*3/uL (ref 0.1–0.9)
NEUTROS ABS: 2.7 10*3/uL (ref 1.4–7.0)
Neutrophils: 45 %
Platelets: 315 10*3/uL (ref 150–450)
RBC: 4.35 x10E6/uL (ref 4.14–5.80)
RDW: 12.7 % (ref 12.3–15.4)
WBC: 6.2 10*3/uL (ref 3.4–10.8)

## 2018-03-01 LAB — BMP8+EGFR
BUN / CREAT RATIO: 13 (ref 10–24)
BUN: 17 mg/dL (ref 8–27)
CHLORIDE: 94 mmol/L — AB (ref 96–106)
CO2: 23 mmol/L (ref 20–29)
Calcium: 10 mg/dL (ref 8.6–10.2)
Creatinine, Ser: 1.36 mg/dL — ABNORMAL HIGH (ref 0.76–1.27)
GFR calc Af Amer: 59 mL/min/{1.73_m2} — ABNORMAL LOW (ref >59)
GFR calc non Af Amer: 51 mL/min/{1.73_m2} — ABNORMAL LOW (ref >59)
GLUCOSE: 100 mg/dL — AB (ref 65–99)
Potassium: 4.3 mmol/L (ref 3.5–5.2)
SODIUM: 133 mmol/L — AB (ref 134–144)

## 2018-03-01 LAB — HEPATITIS C ANTIBODY

## 2018-03-01 LAB — FERRITIN: Ferritin: 31 ng/mL (ref 30–400)

## 2018-03-01 LAB — TSH: TSH: 1.39 u[IU]/mL (ref 0.450–4.500)

## 2018-03-03 ENCOUNTER — Other Ambulatory Visit: Payer: Self-pay | Admitting: *Deleted

## 2018-03-03 ENCOUNTER — Telehealth: Payer: Self-pay | Admitting: Pediatrics

## 2018-03-03 MED ORDER — LISINOPRIL 10 MG PO TABS: 10.0000 mg | ORAL_TABLET | Freq: Every day | ORAL | 0 refills | Status: DC

## 2018-03-03 NOTE — Telephone Encounter (Signed)
Pt aware that we do not have a final report from the DR - we will call him as soon as we do

## 2018-03-10 MED ORDER — ROPINIROLE HCL 0.25 MG PO TABS: ORAL_TABLET | ORAL | 3 refills | Status: DC

## 2018-03-10 NOTE — Addendum Note (Signed)
Addended by: Eustaquio Maize on: 03/10/2018 03:10 PM   Modules accepted: Orders

## 2018-03-31 ENCOUNTER — Other Ambulatory Visit: Payer: Self-pay | Admitting: *Deleted

## 2018-03-31 MED ORDER — LISINOPRIL 10 MG PO TABS: 10.0000 mg | ORAL_TABLET | Freq: Every day | ORAL | 0 refills | Status: DC

## 2018-04-06 ENCOUNTER — Encounter: Payer: Self-pay | Admitting: Pediatrics

## 2018-04-06 ENCOUNTER — Ambulatory Visit (INDEPENDENT_AMBULATORY_CARE_PROVIDER_SITE_OTHER): Payer: Medicare Other | Admitting: Pediatrics

## 2018-04-06 VITALS — BP 119/76 | HR 78 | Temp 98.6°F | Ht 66.0 in | Wt 157.0 lb

## 2018-04-06 DIAGNOSIS — F411 Generalized anxiety disorder: Secondary | ICD-10-CM

## 2018-04-06 DIAGNOSIS — I10 Essential (primary) hypertension: Secondary | ICD-10-CM

## 2018-04-06 DIAGNOSIS — G2581 Restless legs syndrome: Secondary | ICD-10-CM | POA: Diagnosis not present

## 2018-04-06 MED ORDER — LORAZEPAM 1 MG PO TABS: 1.0000 mg | ORAL_TABLET | Freq: Four times a day (QID) | ORAL | 2 refills | Status: DC | PRN

## 2018-04-06 NOTE — Patient Instructions (Signed)
Walk or ride bicycle starting 5-10 minutes every day, goal to work up to 30 minutes every day.   If pain in legs returns, start gentle stretches or riding bicycle for 5 minutes

## 2018-04-06 NOTE — Progress Notes (Signed)
Subjective:   Patient ID: Cameron Atkins, male    DOB: November 15, 1945, 72 y.o.   MRN: [id number] CC: Follow-up Multiple medical problems HPI: Cameron Atkins is a 72 y.o. male   Here today alone.  Restless leg syndrome: The ropinirole does help.  If he takes it every day in a row he starts to feel very anxious when he takes it.  This is what happens with many of the other medicines he is tried in the past including medicines for anxiety.  No rash.  No lip swelling or tingling.  Anxiety: Taking Ativan 1 mg 4 times a day.  In the past patient has tried multiple different medicines.  He tends to have a reaction with most of them, usually worsening anxiety.  Leg pain: Taking gabapentin 600 mg 3 times a day.  Sometimes he misses the noontime dose because he forgets.  Mood has been okay he thinks.  He continues to have times when he cries out of the blue.  He was started on mirtazapine last visit, he has not yet started the medicine because he is worried about the side effects.  He tries to walk regularly, up and down the road at home right now.  He does have an exercise bike at home, has not been using it recently.  Relevant past medical, surgical, family and social history reviewed. Allergies and medications reviewed and updated. Social History   Tobacco Use  Smoking Status Former Smoker  . Packs/day: 0.25  . Years: 2.00  . Pack years: 0.50  . Types: Cigarettes  . Last attempt to quit: 01/14/1969  . Years since quitting: 71.2  Smokeless Tobacco Former Systems developer  . Types: Chew  . Quit date: 01/15/1999   ROS: Per HPI   Objective:    BP 119/76   Pulse 78   Temp 98.6 F (37 C)   Ht 5\' 6"  (1.676 m)   Wt 157 lb (71.2 kg)   BMI 25.34 kg/m   Wt Readings from Last 3 Encounters:  04/06/18 157 lb (71.2 kg)  02/28/18 157 lb 9.6 oz (71.5 kg)  12/26/17 163 lb (73.9 kg)    Gen: NAD, alert, cooperative with exam, NCAT EYES: EOMI, no conjunctival injection, or no icterus ENT:  TMs pearly gray b/l,  OP without erythema LYMPH: no cervical LAD CV: NRRR, normal S1/S2, no murmur, distal pulses 2+ b/l Resp: CTABL, no wheezes, normal WOB Abd: +BS, soft, NTND. no guarding or organomegaly Ext: No edema, warm Neuro: Alert and oriented, strength equal b/l UE and LE, coordination grossly normal MSK: normal muscle bulk Psych: Normal affect.  Mood is "okay".  No thoughts of not wanting to be here anymore.  Assessment & Plan:  Cameron Atkins was seen today for follow-up multiple medical problems.  Diagnoses and all orders for this visit:  Generalized anxiety disorder Continue Ativan.  Patient has been on this medicine for years, will continue to try to find alternate to better control anxiety and wean as able.  Discussed other ways to relieve stress including daily exercise and stretching.  This may also help with the restless leg feeling as at night. -     LORazepam (ATIVAN) 1 MG tablet; Take 1 tablet (1 mg total) by mouth 4 (four) times daily as needed.  Restless leg Discussed trying to increase his daily activity, walking regularly working up to 20 to 30 minutes every day.  Okay to use exercise bike instead.  If he gets the restless feeling in his legs,  try the exercise bike or gentle stretching until the feeling improves.    Essential hypertension Stable, continue current meds.  Depression Patient is going to think about starting mirtazapine again.  Any side effects to the medicine, let me know.  Follow up plan: Return in about 3 months (around 07/07/2018). Assunta Found, MD [place]

## 2018-04-09 ENCOUNTER — Encounter: Payer: Self-pay | Admitting: Pediatrics

## 2018-04-17 ENCOUNTER — Encounter: Payer: Self-pay | Admitting: *Deleted

## 2018-04-17 ENCOUNTER — Ambulatory Visit (INDEPENDENT_AMBULATORY_CARE_PROVIDER_SITE_OTHER): Payer: Medicare Other | Admitting: *Deleted

## 2018-04-17 VITALS — BP 123/81 | HR 73 | Ht 66.0 in | Wt 162.0 lb

## 2018-04-17 DIAGNOSIS — Z0000 Encounter for general adult medical examination without abnormal findings: Secondary | ICD-10-CM

## 2018-04-17 NOTE — Patient Instructions (Addendum)
At your convenience, please bring a copy of your Advance Directive and Living Will to our office to be filed in your medical record.    Work on your goal of riding your stationary bike 25 minutes per day 3 times per week.   Discuss counseling with Dr. Evette Doffing.   Thank you for coming in for your Annual Wellness Visit today!   Preventive Care 41 Years and Older, Male Preventive care refers to lifestyle choices and visits with your health care provider that can promote health and wellness. What does preventive care include?  A yearly physical exam. This is also called an annual well check.  Dental exams once or twice a year.  Routine eye exams. Ask your health care provider how often you should have your eyes checked.  Personal lifestyle choices, including: ? Daily care of your teeth and gums. ? Regular physical activity. ? Eating a healthy diet. ? Avoiding tobacco and drug use. ? Limiting alcohol use. ? Practicing safe sex. ? Taking low doses of aspirin every day. ? Taking vitamin and mineral supplements as recommended by your health care provider. What happens during an annual well check? The services and screenings done by your health care provider during your annual well check will depend on your age, overall health, lifestyle risk factors, and family history of disease. Counseling Your health care provider may ask you questions about your:  Alcohol use.  Tobacco use.  Drug use.  Emotional well-being.  Home and relationship well-being.  Sexual activity.  Eating habits.  History of falls.  Memory and ability to understand (cognition).  Work and work Statistician.  Screening You may have the following tests or measurements:  Height, weight, and BMI.  Blood pressure.  Lipid and cholesterol levels. These may be checked every 5 years, or more frequently if you are over 34 years old.  Skin check.  Lung cancer screening. You may have this screening every year  starting at age 78 if you have a 30-pack-year history of smoking and currently smoke or have quit within the past 15 years.  Fecal occult blood test (FOBT) of the stool. You may have this test every year starting at age 76.  Flexible sigmoidoscopy or colonoscopy. You may have a sigmoidoscopy every 5 years or a colonoscopy every 10 years starting at age 77.  Prostate cancer screening. Recommendations will vary depending on your family history and other risks.  Hepatitis C blood test.  Hepatitis B blood test.  Sexually transmitted disease (STD) testing.  Diabetes screening. This is done by checking your blood sugar (glucose) after you have not eaten for a while (fasting). You may have this done every 1-3 years.  Abdominal aortic aneurysm (AAA) screening. You may need this if you are a current or former smoker.  Osteoporosis. You may be screened starting at age 23 if you are at high risk.  Talk with your health care provider about your test results, treatment options, and if necessary, the need for more tests. Vaccines Your health care provider may recommend certain vaccines, such as:  Influenza vaccine. This is recommended every year.  Tetanus, diphtheria, and acellular pertussis (Tdap, Td) vaccine. You may need a Td booster every 10 years.  Varicella vaccine. You may need this if you have not been vaccinated.  Zoster vaccine. You may need this after age 93.  Measles, mumps, and rubella (MMR) vaccine. You may need at least one dose of MMR if you were born in 1957 or later. You  may also need a second dose.  Pneumococcal 13-valent conjugate (PCV13) vaccine. One dose is recommended after age 24.  Pneumococcal polysaccharide (PPSV23) vaccine. One dose is recommended after age 22.  Meningococcal vaccine. You may need this if you have certain conditions.  Hepatitis A vaccine. You may need this if you have certain conditions or if you travel or work in places where you may be exposed  to hepatitis A.  Hepatitis B vaccine. You may need this if you have certain conditions or if you travel or work in places where you may be exposed to hepatitis B.  Haemophilus influenzae type b (Hib) vaccine. You may need this if you have certain risk factors.  Talk to your health care provider about which screenings and vaccines you need and how often you need them. This information is not intended to replace advice given to you by your health care provider. Make sure you discuss any questions you have with your health care provider. Document Released: 10/10/2015 Document Revised: 06/02/2016 Document Reviewed: 07/15/2015 Elsevier Interactive Patient Education  2018 [place] in the Home Falls can cause injuries. They can happen to people of all ages. There are many things you can do to make your home safe and to help prevent falls. What can I do on the outside of my home?  Regularly fix the edges of walkways and driveways and fix any cracks.  Remove anything that might make you trip as you walk through a door, such as a raised step or threshold.  Trim any bushes or trees on the path to your home.  Use bright outdoor lighting.  Clear any walking paths of anything that might make someone trip, such as rocks or tools.  Regularly check to see if handrails are loose or broken. Make sure that both sides of any steps have handrails.  Any raised decks and porches should have guardrails on the edges.  Have any leaves, snow, or ice cleared regularly.  Use sand or salt on walking paths during winter.  Clean up any spills in your garage right away. This includes oil or grease spills. What can I do in the bathroom?  Use night lights.  Install grab bars by the toilet and in the tub and shower. Do not use towel bars as grab bars.  Use non-skid mats or decals in the tub or shower.  If you need to sit down in the shower, use a plastic, non-slip stool.  Keep the  floor dry. Clean up any water that spills on the floor as soon as it happens.  Remove soap buildup in the tub or shower regularly.  Attach bath mats securely with double-sided non-slip rug tape.  Do not have throw rugs and other things on the floor that can make you trip. What can I do in the bedroom?  Use night lights.  Make sure that you have a light by your bed that is easy to reach.  Do not use any sheets or blankets that are too big for your bed. They should not hang down onto the floor.  Have a firm chair that has side arms. You can use this for support while you get dressed.  Do not have throw rugs and other things on the floor that can make you trip. What can I do in the kitchen?  Clean up any spills right away.  Avoid walking on wet floors.  Keep items that you use a lot in easy-to-reach places.  If you need to reach something above you, use a strong step stool that has a grab bar.  Keep electrical cords out of the way.  Do not use floor polish or wax that makes floors slippery. If you must use wax, use non-skid floor wax.  Do not have throw rugs and other things on the floor that can make you trip. What can I do with my stairs?  Do not leave any items on the stairs.  Make sure that there are handrails on both sides of the stairs and use them. Fix handrails that are broken or loose. Make sure that handrails are as long as the stairways.  Check any carpeting to make sure that it is firmly attached to the stairs. Fix any carpet that is loose or worn.  Avoid having throw rugs at the top or bottom of the stairs. If you do have throw rugs, attach them to the floor with carpet tape.  Make sure that you have a light switch at the top of the stairs and the bottom of the stairs. If you do not have them, ask someone to add them for you. What else can I do to help prevent falls?  Wear shoes that: ? Do not have high heels. ? Have rubber bottoms. ? Are comfortable and fit  you well. ? Are closed at the toe. Do not wear sandals.  If you use a stepladder: ? Make sure that it is fully opened. Do not climb a closed stepladder. ? Make sure that both sides of the stepladder are locked into place. ? Ask someone to hold it for you, if possible.  Clearly mark and make sure that you can see: ? Any grab bars or handrails. ? First and last steps. ? Where the edge of each step is.  Use tools that help you move around (mobility aids) if they are needed. These include: ? Canes. ? Walkers. ? Scooters. ? Crutches.  Turn on the lights when you go into a dark area. Replace any light bulbs as soon as they burn out.  Set up your furniture so you have a clear path. Avoid moving your furniture around.  If any of your floors are uneven, fix them.  If there are any pets around you, be aware of where they are.  Review your medicines with your doctor. Some medicines can make you feel dizzy. This can increase your chance of falling. Ask your doctor what other things that you can do to help prevent falls. This information is not intended to replace advice given to you by your health care provider. Make sure you discuss any questions you have with your health care provider. Document Released: 07/10/2009 Document Revised: 02/19/2016 Document Reviewed: 10/18/2014 Elsevier Interactive Patient Education  [employer].

## 2018-04-18 NOTE — Progress Notes (Signed)
Subjective:   Cameron Atkins is a 72 y.o. male who presents for an Initial Medicare Annual Wellness Visit.  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  He enjoys reading and walking for exercise when he is not too fatigued.  Cameron Atkins lives with his wife and their dog.  He has 3 children and 1 grandchild.  He feels his health is worse this year than it was last year because he does not have as much energy.  He reports no surgeries, ER visits, or hospitalizations in the past year.  Cameron Atkins does report that he struggles with anxiety and it makes him feel physically tense frequently.     Review of Systems   All negative today  Cardiac Risk Factors include: male gender    Objective:    Today's Vitals   04/17/18 1530  BP: 123/81  Pulse: 73  Weight: 162 lb (73.5 kg)  Height: 5\' 6"  (1.676 m)  PainSc: 0-No pain   Body mass index is 26.15 kg/m.  Advanced Directives 04/18/2018 04/17/2018 09/10/2016 09/06/2016 07/09/2016 01/15/2014 04/26/2013  Does Patient Have a Medical Advance Directive? - Yes No No No Patient does not have advance directive;Patient would not like information Patient does not have advance directive  Type of Advance Directive - [place];Living will - - - - -  Does patient want to make changes to medical advance directive? No - Patient declined - - - - - -  Copy of Press photographer in Chart? - No - copy requested - - - - -  Would patient like information on creating a medical advance directive? - - No - Patient declined No - Patient declined No - patient declined information - -  Pre-existing out of facility DNR order (yellow form or pink MOST form) - - - - - - No    Current Medications (verified) Outpatient Encounter Medications as of 04/17/2018  Medication Sig  . calcium citrate-vitamin D (CITRACAL+D) 315-200 MG-UNIT per tablet Take 1 tablet by mouth daily.   .  Cholecalciferol (VITAMIN D3) 3000 units TABS Take 3,000 Units by mouth daily.  . cimetidine (TAGAMET) 400 MG tablet Take 1 tablet (400 mg total) by mouth daily as needed. (Patient taking differently: Take 200 mg by mouth at bedtime as needed (for heartburn/reflux.). )  . fish oil-omega-3 fatty acids 1000 MG capsule Take 2 g by mouth daily after breakfast.   . gabapentin (NEURONTIN) 600 MG tablet Take 1 tablet (600 mg total) by mouth 3 (three) times daily.  . hydrochlorothiazide (HYDRODIURIL) 25 MG tablet TAKE 1 TABLET BY MOUTH ONCE A DAILY  . hydrOXYzine (VISTARIL) 25 MG capsule TAKE ONE CAPSULE BY MOUTH THREE TIMES DAILY AS NEEDED FOR ANXIETY  . lisinopril (PRINIVIL,ZESTRIL) 10 MG tablet Take 1 tablet (10 mg total) by mouth daily.  Marland Kitchen LORazepam (ATIVAN) 1 MG tablet Take 1 tablet (1 mg total) by mouth 4 (four) times daily as needed.  . Multiple Vitamin (MULTIVITAMIN) tablet Take 1 tablet by mouth daily.  . pantoprazole (PROTONIX) 40 MG tablet TAKE ONE TABLET BY MOUTH EVERY MORNING  . rOPINIRole (REQUIP) 0.25 MG tablet Take 1 tab before bed for 1 week, then take 2 tabs. (Patient taking differently: at bedtime as needed. Take 1 tab before bed for 1 week, then take 2 tabs.)  . mirtazapine (REMERON) 15 MG tablet Take 1 tablet (15 mg total) by mouth  at bedtime. (Patient not taking: Reported on 04/06/2018)  . naproxen (NAPROSYN) 500 MG tablet Take 1 tablet (500 mg total) by mouth 2 (two) times daily. (Patient not taking: Reported on 04/06/2018)   No facility-administered encounter medications on file as of 04/17/2018.     Allergies (verified) Benadryl [diphenhydramine]; Mepergan [meperidine-promethazine]; Other; Oxycodone; Amoxicillin; Ciprofloxacin; Doxycycline; Flagyl [metronidazole]; and Singulair [montelukast sodium]   History: Past Medical History:  Diagnosis Date  . Anxiety   . Arthritis   . Bladder stone   . Depression   . Diverticulosis of colon   . GERD (gastroesophageal reflux disease)     . H/O hiatal hernia   . Helicobacter pylori gastritis   . History of colonoscopy with polypectomy    TUBULAR ADENOMA-- 2012  . History of esophageal dilatation    STRICTURE--  LAST DONE 03/2013  . Hypertension    for 5 yrs  . Left ureteral calculus   . Osteoporosis   . Wears glasses    Past Surgical History:  Procedure Laterality Date  . BIOPSY  09/10/2016   Procedure: BIOPSY;  Surgeon: Rogene Houston, MD;  [facility];  Service: Endoscopy;;  rectal polyp  . COLONOSCOPY WITH PROPOFOL N/A 09/10/2016   Procedure: COLONOSCOPY WITH PROPOFOL;  Surgeon: Rogene Houston, MD;  [facility];  Service: Endoscopy;  Laterality: N/A;  . CYSTO/  URETEROSCOPIC LITHOTRIPSY/ STONE EXTRACTION  1995  . CYSTOSCOPY W/ RETROGRADES Right 01/15/2014   Procedure: CYSTOSCOPY WITH RIGHT RETROGRADE PYELOGRAM;  Surgeon: Irine Seal, MD;  Location[facility];  Service: Urology;  Laterality: Right;  . CYSTOSCOPY WITH RETROGRADE PYELOGRAM, URETEROSCOPY AND STENT PLACEMENT Left 01/15/2014   Procedure: CYSTOSCOPY , LEFT RETROGRADE PYELOGRAM, LEFT URETEROSCOPY, BASKET STONE EXTRACTION;  Surgeon: Irine Seal, MD;  Location: [facility];  Service: Urology;  Laterality: Left;  . ESOPHAGEAL DILATION  03/21/2013   Procedure: ESOPHAGEAL DILATION;  Surgeon: Danie Binder, MD;  [facility];  Service: Endoscopy;;  . ESOPHAGEAL DILATION N/A 09/10/2016   Procedure: ESOPHAGEAL DILATION;  Surgeon: Rogene Houston, MD;  [facility];  Service: Endoscopy;  Laterality: N/A;  . ESOPHAGOGASTRODUODENOSCOPY N/A 03/21/2013   Procedure: ESOPHAGOGASTRODUODENOSCOPY (EGD);  Surgeon: Danie Binder, MD;  [facility];  Service: Endoscopy;  Laterality: N/A;  . ESOPHAGOGASTRODUODENOSCOPY (EGD) WITH ESOPHAGEAL DILATION N/A 04/26/2013   Procedure: ESOPHAGOGASTRODUODENOSCOPY (EGD) WITH ESOPHAGEAL DILATION;  Surgeon: Rogene Houston, MD;  [facility];   Service: Endoscopy;  Laterality: N/A;  130  . ESOPHAGOGASTRODUODENOSCOPY (EGD) WITH PROPOFOL N/A 09/10/2016   Procedure: ESOPHAGOGASTRODUODENOSCOPY (EGD) WITH PROPOFOL;  Surgeon: Rogene Houston, MD;  [facility];  Service: Endoscopy;  Laterality: N/A;  10:20  . EXTRACORPOREAL SHOCK WAVE LITHOTRIPSY  X2   YRS AGO  . HOLMIUM LASER APPLICATION Left 9/73/5329   Procedure: HOLMIUM LASER APPLICATION, LEFT URETER;  Surgeon: Irine Seal, MD;  Location: [facility];  Service: Urology;  Laterality: Left;  . SHOULDER ARTHROSCOPY W/ SUBACROMIAL DECOMPRESSION AND DISTAL CLAVICLE EXCISION Left 2000   No family history on file. Social History   Socioeconomic History  . Marital status: Married    Spouse name: Not on file  . Number of children: Not on file  . Years of education: Not on file  . Highest education level: Not on file  Occupational History  . Not on file  Social Needs  . Financial resource strain: Not on file  . Food insecurity:  Worry: Not on file    Inability: Not on file  . Transportation needs:    Medical: Not on file    Non-medical: Not on file  Tobacco Use  . Smoking status: Former Smoker    Packs/day: 0.25    Years: 2.00    Pack years: 0.50    Types: Cigarettes    Last attempt to quit: 01/14/1969    Years since quitting: 49.2  . Smokeless tobacco: Former Systems developer    Types: [place] date: 01/15/1999  Substance and Sexual Activity  . Alcohol use: Yes    Alcohol/week: 12.6 oz    Types: 21 Cans of beer per week    Comment: 1-2 beer daily  . Drug use: No  . Sexual activity: Never    Birth control/protection: None  Lifestyle  . Physical activity:    Days per week: Not on file    Minutes per session: Not on file  . Stress: Not on file  Relationships  . Social connections:    Talks on phone: Not on file    Gets together: Not on file    Attends religious service: Not on file    Active member of club or organization: Not on file    Attends  meetings of clubs or organizations: Not on file    Relationship status: Not on file  Other Topics Concern  . Not on file  Social History Narrative  . Not on file   Tobacco Counseling Counseling given: No   Clinical Intake:     Pain Score: 0-No pain                 Activities of Daily Living In your present state of health, do you have any difficulty performing the following activities: 04/17/2018  Hearing? Y  Comment Ringing ears- not interested in seeing audiologist  Vision? N  Difficulty concentrating or making decisions? Y  Comment trouble remembering names at times  Walking or climbing stairs? Y  Comment bilateral knee pain with climbing stairs  Dressing or bathing? N  Doing errands, shopping? N  Preparing Food and eating ? N  Using the Toilet? N  In the past six months, have you accidently leaked urine? N  Do you have problems with loss of bowel control? N  Managing your Medications? N  Managing your Finances? N  Housekeeping or managing your Housekeeping? N  Some recent data might be hidden     Immunizations and Health Maintenance Immunization History  Administered Date(s) Administered  . Influenza, High Dose Seasonal PF 07/22/2017   There are no preventive care reminders to display for this patient.  Patient Care Team: Eustaquio Maize, MD as PCP - General (Pediatrics)      Assessment:   This is a routine wellness examination for [place].  Hearing/Vision screen No hearing deficit noted during exam, patient states he has ringing in his ears, but is not interested in seeing an audiologist. Sees well with his glasses, seen yearly by eye doctor in [place], [place].   Dietary issues and exercise activities discussed: Current Exercise Habits: Home exercise routine, Type of exercise: walking, Time (Minutes): 20, Frequency (Times/Week): 7, Weekly Exercise (Minutes/Week): 140, Intensity: Mild   Mr. Kapur usually eats 2 meals per day and a snack as needed.   Usually has peanut butter toast and banana mid morning, and a meat and vegetables in the evenings.  Drinks an Ensure daily.  Recommended diet of mostly lean proteins, vegetables, fruits,  and whole grains.   Goals    . Exercise 3x per week (25 min per time)     Ride stationary bike 3 times per week for 25 minutes.      Depression Screen PHQ 2/9 Scores 04/18/2018 04/06/2018 02/28/2018 12/26/2017  PHQ - 2 Score 2 3 0 2  PHQ- 9 Score 13 6 - 8    Discussed mental health counseling with patient as an option for treatment of his anxiety and depression symptoms.  Gave patient a list of counselors in the area.  Asked him to review and call with any questions or if he needs a referral.    Fall Risk Fall Risk  04/18/2018 04/06/2018 02/28/2018 12/26/2017 10/28/2017  Falls in the past year? Yes Yes No Yes No  Number falls in past yr: 1 1 - 2 or more -  Injury with Fall? Yes Yes - Yes -  Comment Left shoulder and back pain left shoulder and back  - - -  Risk Factor Category  - - - High Fall Risk -  Risk for fall due to : History of fall(s) - - History of fall(s) -  Follow up Education provided;Falls prevention discussed - - - -    Is the patient's home free of loose throw rugs in walkways, pet beds, electrical cords, etc?   yes      Grab bars in the bathroom? yes      Handrails on the stairs?   no stairs in home      Adequate lighting?   yes    Cognitive Function: MMSE - Mini Mental State Exam 04/18/2018  Orientation to time 5  Orientation to Place 5  Registration 3  Attention/ Calculation 4  Recall 3  Language- name 2 objects 2  Language- repeat 1  Language- follow 3 step command 3  Language- read & follow direction 1  Write a sentence 1  Copy design 1  Total score 29        Screening Tests Health Maintenance  Topic Date Due  . TETANUS/TDAP  12/27/2018 (Originally 01/26/1965)  . PNA vac Low Risk Adult (1 of 2 - PCV13) 12/27/2018 (Originally 01/25/2010)  . INFLUENZA VACCINE  04/27/2018  .  COLONOSCOPY  09/10/2021  . Hepatitis C Screening  Completed   Patient states he thinks he has had tdap and prevnar in the last few years at [facility] or [facility].  [place] and did not see these immunizations.  [facility], they have no record of any immunizations for patient.  Recommend patient getting tdap and prevnar at a future appointment with Dr. Evette Doffing.  Qualifies for Shingles Vaccine? Yes, declined today  Cancer Screenings: Lung: Low Dose CT Chest recommended if Age 57-80 years, 30 pack-year currently smoking OR have quit w/in 15years. Patient does not qualify. Colorectal: up to date  Additional Screenings:  Hepatitis C Screening:  Completed 02/28/18      Plan:     Bring a copy of your Advance Directive and Living Will to our office to be filed in your medical record.   Work on your goal of riding your stationary bike 25 minutes per day 3 times per week.  Review list of mental health counselors given, and discuss options with with Dr. Evette Doffing if you have questions.  Let her know if you would like a referral.    I have personally reviewed and noted the following in the patient's chart:   . Medical and social history .  Use of alcohol, tobacco or illicit drugs  . Current medications and supplements . Functional ability and status . Nutritional status . Physical activity . Advanced directives . List of other physicians . Hospitalizations, surgeries, and ER visits in previous 12 months . Vitals . Screenings to include cognitive, depression, and falls . Referrals and appointments  In addition, I have reviewed and discussed with patient certain preventive protocols, quality metrics, and best practice recommendations. A written personalized care plan for preventive services as well as general preventive health recommendations were provided to patient.     WYATT, AMY M, RN   04/18/2018   I have reviewed and agree with the above AWV documentation.   Assunta Found, MD [facility] 04/20/2018, 1:59 PM

## 2018-04-27 ENCOUNTER — Telehealth: Payer: Self-pay | Admitting: Pediatrics

## 2018-04-27 MED ORDER — PRAMIPEXOLE DIHYDROCHLORIDE 0.125 MG PO TABS: ORAL_TABLET | ORAL | 2 refills | Status: DC

## 2018-04-27 NOTE — Telephone Encounter (Signed)
Sent in pramipexole, take 1 tab 2-3 hours before bedtime. After 1 week can increase to two tabs. DO not take ropinirole anymore.

## 2018-04-27 NOTE — Telephone Encounter (Signed)
Pt aware of new rx sent in and directions for how to take it. Pt also requested an appt with Dr Evette Doffing to discuss leg problems. Appt given for 8/5 at 11:15.

## 2018-05-01 ENCOUNTER — Encounter: Payer: Self-pay | Admitting: Pediatrics

## 2018-05-01 ENCOUNTER — Ambulatory Visit (INDEPENDENT_AMBULATORY_CARE_PROVIDER_SITE_OTHER): Payer: Medicare Other | Admitting: Pediatrics

## 2018-05-01 VITALS — BP 106/72 | HR 69 | Temp 97.3°F | Ht 66.0 in | Wt 160.2 lb

## 2018-05-01 DIAGNOSIS — F411 Generalized anxiety disorder: Secondary | ICD-10-CM | POA: Diagnosis not present

## 2018-05-01 DIAGNOSIS — G2581 Restless legs syndrome: Secondary | ICD-10-CM | POA: Diagnosis not present

## 2018-05-01 MED ORDER — LORAZEPAM 1 MG PO TABS: 1.0000 mg | ORAL_TABLET | Freq: Four times a day (QID) | ORAL | 1 refills | Status: DC | PRN

## 2018-05-01 MED ORDER — ROPINIROLE HCL 0.25 MG PO TABS: ORAL_TABLET | ORAL | 3 refills | Status: DC

## 2018-05-01 NOTE — Progress Notes (Signed)
  Subjective:   Patient ID: Cameron Atkins, male    DOB: 09-Feb-1946, 72 y.o.   MRN: [id number] CC: Restless legs follow-up HPI: Cameron Atkins is a 72 y.o. male   Restless legs: At last visit he was taking ropinirole as needed for restless legs feeling.  He took it later in the evening, after midnight 1 day, he was fatigued all through the next day.  He called last week to ask for alternative.  Pramipexole was sent in, he tried it one night.  Made him feel more anxious so he has not taken it since.  In the last week his legs have not been bothering him as much.  He is been trying to rub his legs down nightly which is helped some with symptoms.  He does try walking some which also helps his symptoms.  Anxiety: He takes Ativan 4 times a day with usually fairly good control of his anxiety.  He has been tried on multiple other medicines in the past and had side effects.  He has fairly good exercise tolerance, able to do work outside such as digging, after a while he will have to stop and take a break.  No chest pain chest tightness chest from exertion.  Relevant past medical, surgical, family and social history reviewed. Allergies and medications reviewed and updated. Social History   Tobacco Use  Smoking Status Former Smoker  . Packs/day: 0.25  . Years: 2.00  . Pack years: 0.50  . Types: Cigarettes  . Last attempt to quit: 01/14/1969  . Years since quitting: 49.3  Smokeless Tobacco Former Systems developer  . Types: Chew  . Quit date: 01/15/1999   ROS: Per HPI   Objective:    BP 106/72   Pulse 69   Temp (!) 97.3 F (36.3 C) (Oral)   Ht 5\' 6"  (1.676 m)   Wt 160 lb 3.2 oz (72.7 kg)   BMI 25.86 kg/m   Wt Readings from Last 3 Encounters:  05/01/18 160 lb 3.2 oz (72.7 kg)  04/17/18 162 lb (73.5 kg)  04/06/18 157 lb (71.2 kg)    O2 sats walking started at >95%, came down to 94% with brisk walking   Gen: NAD, alert, cooperative with exam, NCAT EYES: EOMI, no conjunctival injection, or no  icterus CV: NRRR, normal S1/S2, no murmur, distal pulses 2+ b/l Resp: CTABL, no wheezes, normal WOB Abd: +BS, soft, NTND. no guarding or organomegaly Ext: No edema, warm Neuro: Alert and oriented, strength equal b/l UE and LE, coordination grossly normal MSK: normal muscle bulk  Assessment & Plan:  72 year old male here today for follow-up of restless legs.  Diagnoses and all orders for this visit:  Restless leg Will go back to ropinirole, okay to take as needed for restless leg feeling.  Continue nonmedication therapies, massage and walking. -     rOPINIRole (REQUIP) 0.25 MG tablet; Take 1 tab before bed for 1 week, then take 2 tabs.  Generalized anxiety disorder Stable, continue below -     LORazepam (ATIVAN) 1 MG tablet; Take 1 tablet (1 mg total) by mouth 4 (four) times daily as needed.   Follow up plan: 2 to 3 months as scheduled. Assunta Found, MD [place]

## 2018-05-16 DIAGNOSIS — H25013 Cortical age-related cataract, bilateral: Secondary | ICD-10-CM | POA: Diagnosis not present

## 2018-05-16 DIAGNOSIS — H2513 Age-related nuclear cataract, bilateral: Secondary | ICD-10-CM | POA: Diagnosis not present

## 2018-05-16 DIAGNOSIS — H35461 Secondary vitreoretinal degeneration, right eye: Secondary | ICD-10-CM | POA: Diagnosis not present

## 2018-05-16 DIAGNOSIS — H11153 Pinguecula, bilateral: Secondary | ICD-10-CM | POA: Diagnosis not present

## 2018-05-16 DIAGNOSIS — D3132 Benign neoplasm of left choroid: Secondary | ICD-10-CM | POA: Diagnosis not present

## 2018-05-16 DIAGNOSIS — H43393 Other vitreous opacities, bilateral: Secondary | ICD-10-CM | POA: Diagnosis not present

## 2018-05-16 DIAGNOSIS — H35363 Drusen (degenerative) of macula, bilateral: Secondary | ICD-10-CM | POA: Diagnosis not present

## 2018-05-16 DIAGNOSIS — H31002 Unspecified chorioretinal scars, left eye: Secondary | ICD-10-CM | POA: Diagnosis not present

## 2018-05-16 DIAGNOSIS — H25813 Combined forms of age-related cataract, bilateral: Secondary | ICD-10-CM | POA: Diagnosis not present

## 2018-05-16 DIAGNOSIS — H3589 Other specified retinal disorders: Secondary | ICD-10-CM | POA: Diagnosis not present

## 2018-05-21 ENCOUNTER — Other Ambulatory Visit: Payer: Self-pay | Admitting: Pediatrics

## 2018-05-21 DIAGNOSIS — F411 Generalized anxiety disorder: Secondary | ICD-10-CM

## 2018-06-01 ENCOUNTER — Telehealth: Payer: Self-pay | Admitting: Pediatrics

## 2018-06-01 NOTE — Telephone Encounter (Signed)
Left message to call back  

## 2018-06-01 NOTE — Telephone Encounter (Signed)
Patient states he is taking the ropinirole as needed every other day.  Patient sates that he could not take medication, it makes him feel anxious after 3 days.   Patient aware of recommendations such as gentle stretches and walking.

## 2018-06-01 NOTE — Telephone Encounter (Signed)
How much of the ropinirole is he taking and how often? Was taking it as needed when I saw him last. Should try it nightly, working up to 2 tabs, we might need to increase it further. Will need to be seen for back pain. Heat, gentle stretches and wlaking can help.

## 2018-06-15 ENCOUNTER — Ambulatory Visit (INDEPENDENT_AMBULATORY_CARE_PROVIDER_SITE_OTHER): Payer: Medicare Other

## 2018-06-15 ENCOUNTER — Ambulatory Visit (INDEPENDENT_AMBULATORY_CARE_PROVIDER_SITE_OTHER): Payer: Medicare Other | Admitting: Pediatrics

## 2018-06-15 ENCOUNTER — Encounter: Payer: Self-pay | Admitting: Pediatrics

## 2018-06-15 VITALS — BP 130/86 | HR 61 | Temp 97.2°F | Ht 66.0 in | Wt 163.6 lb

## 2018-06-15 DIAGNOSIS — M545 Low back pain: Secondary | ICD-10-CM | POA: Diagnosis not present

## 2018-06-15 DIAGNOSIS — G8929 Other chronic pain: Secondary | ICD-10-CM

## 2018-06-15 DIAGNOSIS — G2581 Restless legs syndrome: Secondary | ICD-10-CM

## 2018-06-15 DIAGNOSIS — M810 Age-related osteoporosis without current pathological fracture: Secondary | ICD-10-CM

## 2018-06-15 DIAGNOSIS — F419 Anxiety disorder, unspecified: Secondary | ICD-10-CM

## 2018-06-15 NOTE — Patient Instructions (Addendum)
[facility] Behavioral Health Contact: [phone] will call to help set up counseling

## 2018-06-15 NOTE — Progress Notes (Signed)
Subjective:   Patient ID: Cameron Atkins, male    DOB: 07-30-1946, 72 y.o.   MRN: [id number] CC: Restless leg  HPI: Cameron Atkins is a 73 y.o. male   Restless legs: happening every night for a while, taking ropinirole for up to 2-3 days in a row, starts to have anxiety related to the medicine he thinks so has to stop. Legs feel sore sometimes, uncomfortable where it lays against the chair when he is awake. 3 days this past week was leg symptom free, back pain also improved.  Wonders if back pain could be contributing to restless leg.  Gets a feeling that he must get up and walk, and not able to sit still.  Happens more at night often not getting to bed until early in the morning as late as 5 or 6 AM.  Then sleeping to the early afternoon.  He says his wife would like him on a more regular schedule but he says he does not see the point.  Has had low back pain for past few months since a fall. Hurts off and on. Trying to do low back therapy exercises home regularly. Walking up and down his road up to 15-20 minutes at a time, never has SOB or chest pain. Legs don't give out with exercise, get tired with walking with going up hills, no weakness.   Describes multiple situations where he can't relax when he used to be able to such as while drinking a beer or eating out at a restaurant.  Anxiety: Worsened as he took increased doses of medications such as paxel or mirtazepine to help with anxiety control in the past.  Has the same feeling when he takes an antibiotic for longer than 2 to 3 days.  He can stop for a day and then restart.  No rash on his skin.  Describes an anxious feeling, tightness in his chest related to the medicine.  He says it is present all day.  He does not get significantly worse right after taking the medicine but the feeling does go away once he stopped the medicine.  Drinking caffeine throughout night. Thinks since exposed to chemicals several years ago he has been more susceptible to  this type of anxiety and breathing problem.  Relevant past medical, surgical, family and social history reviewed. Allergies and medications reviewed and updated. Social History   Tobacco Use  Smoking Status Former Smoker  . Packs/day: 0.25  . Years: 2.00  . Pack years: 0.50  . Types: Cigarettes  . Last attempt to quit: 01/14/1969  . Years since quitting: 49.4  Smokeless Tobacco Former Systems developer  . Types: Chew  . Quit date: 01/15/1999   ROS: Per HPI   Objective:    BP 130/86   Pulse 61   Temp (!) 97.2 F (36.2 C) (Oral)   Ht '5\' 6"'$  (1.676 m)   Wt 163 lb 9.6 oz (74.2 kg)   BMI 26.41 kg/m   Wt Readings from Last 3 Encounters:  06/15/18 163 lb 9.6 oz (74.2 kg)  05/01/18 160 lb 3.2 oz (72.7 kg)  04/17/18 162 lb (73.5 kg)    Gen: NAD, alert, cooperative with exam, NCAT EYES: EOMI, no conjunctival injection, or no icterus ENT: OP without erythema CV: NRRR, normal S1/S2, no murmur, distal pulses 2+ b/l Resp: CTABL, no wheezes, normal WOB Abd: +BS, soft, NTND. Ext: No edema, warm Neuro: Alert and oriented, strength equal b/l UE and LE, coordination grossly normal MSK: no point  tenderness over spine  Assessment & Plan:  [name] was seen today for restless leg.  Diagnoses and all orders for this visit:  Restless leg May need to get anxiety under better control before able to tolerate medicine.  Did have some improvement with the ropinirole but then starts to get more anxious while on it.  Continue to discuss sleep hygiene-waking up at the same time every morning, trying to get to bed at a regular time, decreasing caffeine intake. -     Ambulatory referral to Neurology  Chronic right-sided low back pain without sciatica Gentle back exercises, will refer to physical therapy. -     DG Lumbar Spine 2-3 Views; Future -     BMP8+EGFR -     Ambulatory referral to Physical Therapy  Anxiety Discussed the benefits at this time of seeing psychiatry.  The only medicine that has worked  to relieve some symptoms is Ativan which he has been taking 4 times a day for several years with ongoing breakthrough symptoms.  Anxiety is affecting him daily. He also takes gabapentin 3 times a day has been tried on Paxil, he thinks other SSRIs, mirtazapine in the past.  He describes worsening anxiety symptoms with the medicines he has had to discontinue more than side effects or medication reactions. -     Ambulatory referral to Psychiatry  Osteoporosis, unspecified osteoporosis type, unspecified pathological fracture presence Patient says is been told he has osteoporosis in the past and was told he needs Prolia.  I have no documentation of the DEXA scan and it is unclear where he had this test done.  Will get DEXA scan. -     DG [facility] DEXA   Follow up plan: Return in about 8 weeks (around 08/10/2018). Assunta Found, MD [place]

## 2018-06-16 ENCOUNTER — Telehealth: Payer: Self-pay

## 2018-06-16 ENCOUNTER — Encounter: Payer: Self-pay | Admitting: Pediatrics

## 2018-06-16 NOTE — Telephone Encounter (Signed)
[facility] - Left Msg 

## 2018-06-20 ENCOUNTER — Telehealth: Payer: Self-pay | Admitting: Pediatrics

## 2018-06-20 NOTE — Telephone Encounter (Signed)
Refer to lab note °

## 2018-06-23 DIAGNOSIS — M810 Age-related osteoporosis without current pathological fracture: Secondary | ICD-10-CM | POA: Diagnosis not present

## 2018-06-26 ENCOUNTER — Telehealth: Payer: Self-pay

## 2018-06-26 ENCOUNTER — Telehealth: Payer: Self-pay | Admitting: Pediatrics

## 2018-06-26 NOTE — Telephone Encounter (Signed)
2nd attempt - [facility]   

## 2018-06-26 NOTE — Telephone Encounter (Signed)
appt made

## 2018-06-26 NOTE — Telephone Encounter (Signed)
Can you schedule sooner appt with me and we can discuss treatment options then.

## 2018-06-30 ENCOUNTER — Telehealth: Payer: Self-pay

## 2018-06-30 DIAGNOSIS — L853 Xerosis cutis: Secondary | ICD-10-CM | POA: Diagnosis not present

## 2018-06-30 DIAGNOSIS — L304 Erythema intertrigo: Secondary | ICD-10-CM | POA: Diagnosis not present

## 2018-06-30 DIAGNOSIS — D1801 Hemangioma of skin and subcutaneous tissue: Secondary | ICD-10-CM | POA: Diagnosis not present

## 2018-06-30 DIAGNOSIS — D2339 Other benign neoplasm of skin of other parts of face: Secondary | ICD-10-CM | POA: Diagnosis not present

## 2018-06-30 DIAGNOSIS — L821 Other seborrheic keratosis: Secondary | ICD-10-CM | POA: Diagnosis not present

## 2018-06-30 DIAGNOSIS — L720 Epidermal cyst: Secondary | ICD-10-CM | POA: Diagnosis not present

## 2018-06-30 DIAGNOSIS — L814 Other melanin hyperpigmentation: Secondary | ICD-10-CM | POA: Diagnosis not present

## 2018-06-30 DIAGNOSIS — D225 Melanocytic nevi of trunk: Secondary | ICD-10-CM | POA: Diagnosis not present

## 2018-06-30 DIAGNOSIS — Z85828 Personal history of other malignant neoplasm of skin: Secondary | ICD-10-CM | POA: Diagnosis not present

## 2018-06-30 NOTE — Telephone Encounter (Signed)
3rd attempt - [facility]  

## 2018-07-03 ENCOUNTER — Telehealth: Payer: Self-pay

## 2018-07-03 NOTE — Telephone Encounter (Signed)
[facility] - Patient reports that he needs to speak to Dr. Evette Doffing this Wednesday at his scheduled appointment.  Patient declined [place] services

## 2018-07-05 ENCOUNTER — Ambulatory Visit (INDEPENDENT_AMBULATORY_CARE_PROVIDER_SITE_OTHER): Payer: Medicare Other | Admitting: Pediatrics

## 2018-07-05 ENCOUNTER — Encounter: Payer: Self-pay | Admitting: Pediatrics

## 2018-07-05 VITALS — BP 136/84 | HR 79 | Temp 97.9°F | Ht 66.0 in | Wt 160.8 lb

## 2018-07-05 DIAGNOSIS — M545 Low back pain: Secondary | ICD-10-CM

## 2018-07-05 DIAGNOSIS — M810 Age-related osteoporosis without current pathological fracture: Secondary | ICD-10-CM

## 2018-07-05 DIAGNOSIS — G8929 Other chronic pain: Secondary | ICD-10-CM

## 2018-07-05 DIAGNOSIS — F411 Generalized anxiety disorder: Secondary | ICD-10-CM

## 2018-07-05 DIAGNOSIS — G2581 Restless legs syndrome: Secondary | ICD-10-CM | POA: Diagnosis not present

## 2018-07-05 NOTE — Patient Instructions (Signed)
[facility]al [facility] Contact: [phone]  

## 2018-07-05 NOTE — Progress Notes (Signed)
Subjective:   Patient ID: Cameron Atkins, male    DOB: [birth year], 72 y.o.   MRN: [id number] CC: Dexa scan results  HPI: Cameron Atkins is a 72 y.o. male   Recent DEXA scan with T score -2.9 at right femur neck.  Also had x-ray because of back pain showing compression fracture at L1 new in the last couple years.  He did have a significant fall within the last couple years that could have contributed to compression fracture.  Current back pain is lower than where compression fracture is, tends to be more on the sides of the back rather than his spine itself. Open to physical therapy for low back pain.  Was recommended to start Prolia in the past by another office.  Patient nervous about the time it is in his system.  With all recent medicines he started, after a few days he gets worsening anxiety symptoms, having a hard time taking a deep breath, feeling tightness in his chest.  No throat swelling, no rashes.  If he skips the pill/medicine for a few days he is able to restart it, able to take it for a few more days prior to symptoms returning.  Restless leg syndrome: Ongoing symptoms.  Not able to tolerate the medicine more than a few days.  Does not think he would tolerate a sleep study due to anxiety of being in a new and different place trying to sleep.    Anxiety: Referred to psychiatry last visit. Does have ongoing uncontrolled anxiety symptoms. In the past has been tried on many different medicines with worsening anxiety symptoms as above. Is open to seeing psychiatrist.  Relevant past medical, surgical, family and social history reviewed. Allergies and medications reviewed and updated. Social History   Tobacco Use  Smoking Status Former Smoker  . Packs/day: 0.25  . Years: 2.00  . Pack years: 0.50  . Types: Cigarettes  . Last attempt to quit: 01/14/1969  . Years since quitting: 49.5  Smokeless Tobacco Former Systems developer  . Types: Chew  . Quit date: 01/15/1999   ROS: Per HPI   Objective:      BP 136/84   Pulse 79   Temp 97.9 F (36.6 C) (Oral)   Ht 5\' 6"  (1.676 m)   Wt 160 lb 12.8 oz (72.9 kg)   BMI 25.95 kg/m   Wt Readings from Last 3 Encounters:  07/05/18 160 lb 12.8 oz (72.9 kg)  06/15/18 163 lb 9.6 oz (74.2 kg)  05/01/18 160 lb 3.2 oz (72.7 kg)    Gen: NAD, alert, cooperative with exam, NCAT EYES: EOMI, no conjunctival injection, or no icterus ENT:   OP without erythema LYMPH: no cervical LAD CV: NRRR, normal S1/S2, no murmur, distal pulses 2+ b/l Resp: CTABL, no wheezes, normal WOB Abd: +BS, soft, NTND.  Ext: No edema, warm Neuro: Alert and oriented, strength equal b/l UE and LE, coordination grossly normal  Assessment & Plan:  Cameron Atkins was seen today for dexa scan results.  Diagnoses and all orders for this visit:  Osteoporosis, unspecified osteoporosis type, unspecified pathological fracture presence Discussed treatment options.  Because Prolia is a monoclonal antibody, low risk for reaction.  Patient nervous to try.  Discussed options including having him see an endocrinologist to talk further about options, having him see an allergist to do some drug testing to rule out drug sensitivity, continuing vitamin D and calcium supplements.  For now we will continue vitamin D and calcium supplements.  Generalized anxiety disorder  Suspect contributing to many of his symptoms.  Patient agrees to follow-up with psychiatry, they called to set up appointment after last visit.  He has their phone number and will call to schedule an appointment to discuss how to improve anxiety control.  Chronic right-sided low back pain without sciatica Doubt compression fracture is contributing to pain given location.  Okay to proceed with physical therapy.  Patient has phone number to call to set up  Restless leg Intolerant of ropinirole for longer than a couple days at a time.  Possible that if anxiety is better treated this tolerance will improve.  Referred to neurology to discuss  further options at last visit, patient has phone number to call to set up.  Follow up plan: Return in about 4 weeks (around 08/02/2018). Assunta Found, MD [place]

## 2018-07-10 ENCOUNTER — Ambulatory Visit: Payer: Medicare Other | Admitting: Pediatrics

## 2018-07-10 ENCOUNTER — Telehealth: Payer: Self-pay

## 2018-07-10 DIAGNOSIS — F411 Generalized anxiety disorder: Secondary | ICD-10-CM

## 2018-07-10 NOTE — BH Specialist Note (Signed)
[facility] Initial Assessment   MRN: [id number] NAME: Cameron Atkins Date: 07/30/18   Total time: 1 hour Call number: 1/6  Reason for call today: Reason for Contact: Initial Assessment  PHQ-9 Scores:  Depression screen [facility] 2/9 2018-07-30 07/05/2018 06/15/2018 05/01/2018 04/18/2018  Decreased Interest 1 - 0 0 2  Down, Depressed, Hopeless 1 0 0 0 0  PHQ - 2 Score 2 0 0 0 2  Altered sleeping 0 - 0 - 3  Tired, decreased energy 0 - 0 - 3  Change in appetite 0 - 0 - 3  Feeling bad or failure about yourself  1 - 0 - 0  Trouble concentrating 0 - 0 - 1  Moving slowly or fidgety/restless 0 - 0 - 1  Suicidal thoughts 0 - 0 - 0  PHQ-9 Score 3 - 0 - 13  Difficult doing work/chores Not difficult at all - - - -     GAD-7 Scores:  GAD 7 : Generalized Anxiety Score 2018/07/30  Nervous, Anxious, on Edge 1  Control/stop worrying 1  Worry too much - different things 1  Trouble relaxing 1  Restless 0  Easily annoyed or irritable 0  Afraid - awful might happen 1  Total GAD 7 Score 5  Anxiety Difficulty Somewhat difficult    Stress Current stressors: Current Stressors: (None Reported) Sleep: Sleep: Decreased, Difficulty staying asleep Appetite: Appetite: No problems Coping ability: Coping ability: Normal  Patient taking medications as prescribed: PCP prescribes psychiatric medication (Ativan)  Current medications:  Outpatient Encounter Medications as of Jul 30, 2018  Medication Sig  . calcium citrate-vitamin D (CITRACAL+D) 315-200 MG-UNIT per tablet Take 1 tablet by mouth daily.   . Cholecalciferol (VITAMIN D3) 3000 units TABS Take 3,000 Units by mouth daily.  . cimetidine (TAGAMET) 400 MG tablet Take 1 tablet (400 mg total) by mouth daily as needed. (Patient taking differently: Take 200 mg by mouth at bedtime as needed (for heartburn/reflux.). )  . fish oil-omega-3 fatty acids 1000 MG capsule Take 2 g by mouth daily after breakfast.   . gabapentin (NEURONTIN) 600 MG tablet Take  1 tablet (600 mg total) by mouth 3 (three) times daily.  . hydrochlorothiazide (HYDRODIURIL) 25 MG tablet TAKE 1 TABLET BY MOUTH ONCE A DAILY  . hydrOXYzine (VISTARIL) 25 MG capsule TAKE ONE CAPSULE BY MOUTH THREE TIMES DAILY AS NEEDED FOR ANXIETY  . lisinopril (PRINIVIL,ZESTRIL) 10 MG tablet Take 1 tablet (10 mg total) by mouth daily.  Marland Kitchen LORazepam (ATIVAN) 1 MG tablet Take 1 tablet (1 mg total) by mouth 4 (four) times daily as needed.  . mirtazapine (REMERON) 15 MG tablet Take 1 tablet (15 mg total) by mouth at bedtime.  . Multiple Vitamin (MULTIVITAMIN) tablet Take 1 tablet by mouth daily.  . naproxen (NAPROSYN) 500 MG tablet Take 1 tablet (500 mg total) by mouth 2 (two) times daily.  . pantoprazole (PROTONIX) 40 MG tablet TAKE ONE TABLET BY MOUTH EVERY MORNING  . rOPINIRole (REQUIP) 0.25 MG tablet Take 1 tab before bed for 1 week, then take 2 tabs.   No facility-administered encounter medications on file as of 30-Jul-2018.      Self-harm Behaviors Risk Assessment Self-harm risk factors: Self-harm risk factors: Acts of self-harm(None Reported) Patient endorses recent thoughts of harming self: Have you recently had any thoughts about harming yourself?: No  [place] Suicide Severity Rating Scale: No flowsheet data found. C-SRSS July 30, 2018  1. Wish to be Dead No  2. Suicidal Thoughts No  6. Suicide  Behavior Question No     Danger to Others Risk Assessment Danger to others risk factors: Danger to Others Risk Factors: No risk factors noted Patient endorses recent thoughts of harming others: Notification required: No need or identified person    Substance Use Assessment Patient recently consumed alcohol:  No  Alcohol Use Disorder Identification Test (AUDIT):  Alcohol Use Disorder Test (AUDIT) 07/10/2018  1. How often do you have a drink containing alcohol? 0  2. How many drinks containing alcohol do you have on a typical day when you are drinking? 0  3. How often do you have six or  more drinks on one occasion? 0  AUDIT-C Score 0  Intervention/Follow-up AUDIT Score <7 follow-up not indicated   Patient recently used drugs:  No    Goals, Interventions and Follow-up Plan Goals: Increase healthy adjustment to current life circumstances Interventions: Motivational Interviewing and Supportive Counseling Follow-up Plan: [facility] Phone Follow UP   Summary:   [facility] Initial Intake Assessment, - Patient is a 72 year old male.  Patient reports that he is not sure why Dr, Evette Doffing has referred the patient for services.  Patient reports that he does not think that he needs any type of outpatient therapy and his  Patient reports that his medication to help him sleep makes him feel sluggish; therefore, he does no take the medication daily.   Patient reports that he has been taking ativan for over 30 years.  Patient reports that the Merryl Hacker is helping him to control his anxiety.  Patient reports negative side effects with when he was taking Rimeron in the past.  Patient denies having any issues with the with the ativan.  Patient reports that he takes 1mg  4 times daily.   Patient denies SI/HI/Psychosis/Substance Abuse. If your symptoms worsen or you have thoughts of suicide/homicide, PLEASE SEEK IMMEDIATE MEDICAL ATTENTION.  You may always call:  National Suicide Hotline: 747-189-1272;  [place] Crisis Line: 854 428 5782;  Crisis Recovery in [place]: ([phone].  These are available 24 hours a day, 7 days a week.  Patient lives with his wife and they have been married for 25 years.  Patient reports that he has 3 adult children.  Patient denies outpatient therapy or inpatient psychiatric hospitalization.         Graciella Freer [place], LCAS-A

## 2018-07-10 NOTE — BH Specialist Note (Signed)
    .  v[facility]

## 2018-07-12 NOTE — Progress Notes (Signed)
[place] specialist to contact the patient to make appointment for psych evaluation.

## 2018-07-13 DIAGNOSIS — G8929 Other chronic pain: Secondary | ICD-10-CM | POA: Diagnosis not present

## 2018-07-13 DIAGNOSIS — M545 Low back pain: Secondary | ICD-10-CM | POA: Diagnosis not present

## 2018-07-13 DIAGNOSIS — R262 Difficulty in walking, not elsewhere classified: Secondary | ICD-10-CM | POA: Diagnosis not present

## 2018-07-16 ENCOUNTER — Other Ambulatory Visit: Payer: Self-pay | Admitting: Pediatrics

## 2018-07-16 DIAGNOSIS — I10 Essential (primary) hypertension: Secondary | ICD-10-CM

## 2018-07-18 DIAGNOSIS — R262 Difficulty in walking, not elsewhere classified: Secondary | ICD-10-CM | POA: Diagnosis not present

## 2018-07-18 DIAGNOSIS — M545 Low back pain: Secondary | ICD-10-CM | POA: Diagnosis not present

## 2018-07-18 DIAGNOSIS — G8929 Other chronic pain: Secondary | ICD-10-CM | POA: Diagnosis not present

## 2018-07-25 DIAGNOSIS — M545 Low back pain: Secondary | ICD-10-CM | POA: Diagnosis not present

## 2018-07-25 DIAGNOSIS — G8929 Other chronic pain: Secondary | ICD-10-CM | POA: Diagnosis not present

## 2018-07-25 DIAGNOSIS — R262 Difficulty in walking, not elsewhere classified: Secondary | ICD-10-CM | POA: Diagnosis not present

## 2018-07-27 DIAGNOSIS — G8929 Other chronic pain: Secondary | ICD-10-CM | POA: Diagnosis not present

## 2018-07-27 DIAGNOSIS — M545 Low back pain: Secondary | ICD-10-CM | POA: Diagnosis not present

## 2018-07-27 DIAGNOSIS — R262 Difficulty in walking, not elsewhere classified: Secondary | ICD-10-CM | POA: Diagnosis not present

## 2018-07-31 ENCOUNTER — Ambulatory Visit (INDEPENDENT_AMBULATORY_CARE_PROVIDER_SITE_OTHER): Payer: Medicare Other | Admitting: Pediatrics

## 2018-07-31 ENCOUNTER — Encounter: Payer: Self-pay | Admitting: Pediatrics

## 2018-07-31 VITALS — BP 131/80 | HR 64 | Temp 97.6°F | Ht 66.0 in | Wt 159.0 lb

## 2018-07-31 DIAGNOSIS — I10 Essential (primary) hypertension: Secondary | ICD-10-CM | POA: Diagnosis not present

## 2018-07-31 DIAGNOSIS — G2581 Restless legs syndrome: Secondary | ICD-10-CM | POA: Diagnosis not present

## 2018-07-31 DIAGNOSIS — F339 Major depressive disorder, recurrent, unspecified: Secondary | ICD-10-CM

## 2018-07-31 DIAGNOSIS — Z23 Encounter for immunization: Secondary | ICD-10-CM | POA: Diagnosis not present

## 2018-07-31 DIAGNOSIS — F411 Generalized anxiety disorder: Secondary | ICD-10-CM | POA: Diagnosis not present

## 2018-07-31 DIAGNOSIS — D649 Anemia, unspecified: Secondary | ICD-10-CM | POA: Diagnosis not present

## 2018-07-31 MED ORDER — LORAZEPAM 1 MG PO TABS: 1.0000 mg | ORAL_TABLET | Freq: Four times a day (QID) | ORAL | 2 refills | Status: DC | PRN

## 2018-07-31 MED ORDER — LISINOPRIL 10 MG PO TABS: 10.0000 mg | ORAL_TABLET | Freq: Every day | ORAL | 3 refills | Status: DC

## 2018-07-31 MED ORDER — HYDROXYZINE PAMOATE 25 MG PO CAPS: 25.0000 mg | ORAL_CAPSULE | Freq: Three times a day (TID) | ORAL | 3 refills | Status: DC | PRN

## 2018-07-31 MED ORDER — HYDROCHLOROTHIAZIDE 25 MG PO TABS: ORAL_TABLET | ORAL | 2 refills | Status: DC

## 2018-07-31 NOTE — Patient Instructions (Signed)
   Cameron Atkins  [phone] [place], [place]

## 2018-07-31 NOTE — Progress Notes (Signed)
Subjective:   Patient ID: Cameron Atkins, male    DOB: [birth year], 72 y.o.   MRN: [id number] CC: Medical Management of Chronic Issues  HPI: Cameron Atkins is a 72 y.o. male   Restless legs: Now tolerating 1 tab ropinirole at night, sometimes taking additional half tab either at night or in the morning if his legs are still feeling off.  Has been able to do this lately without having side effects of chest tightness.  Depression: Intermittently feels like he " goes through a cloud" and that crying would help make it better.  He does not necessarily cry in these times.  Sometimes he does.  Sometimes it helps.  He has not been tolerant of Remeron, causes chest tightness and worsening anxiety as most medicines do when there initially started for him.  He has not had any thoughts of self-harm.  Anxiety disorder: Taking Ativan 4 times a day.  Also taking gabapentin 3 times a day.  Has been on Ativan at this dose for years.  Has not been able to wean.  Has been tried on SSRIs, SNRIs in the past.  Referral was placed to psychiatry.  He has phone number to call back to schedule appointment.  No heart palpitations, racing heartbeat.  No chest pain or shortness of breath with exertion.  Relevant past medical, surgical, family and social history reviewed. Allergies and medications reviewed and updated. Social History   Tobacco Use  Smoking Status Former Smoker  . Packs/day: 0.25  . Years: 2.00  . Pack years: 0.50  . Types: Cigarettes  . Last attempt to quit: 01/14/1969  . Years since quitting: 49.5  Smokeless Tobacco Former Systems developer  . Types: Chew  . Quit date: 01/15/1999   ROS: Per HPI   Objective:    BP 131/80   Pulse 64   Temp 97.6 F (36.4 C) (Oral)   Ht _0  (1.676 m)   Wt 159 lb (72.1 kg)   BMI 25.66 kg/m   Wt Readings from Last 3 Encounters:  07/31/18 159 lb (72.1 kg)  07/05/18 160 lb 12.8 oz (72.9 kg)  06/15/18 163 lb 9.6 oz (74.2 kg)    Gen: NAD, alert, cooperative with exam,  NCAT EYES: EOMI, no conjunctival injection, or no icterus ENT:   OP without erythema LYMPH: no cervical LAD CV: NRRR, normal S1/S2, no murmur, distal pulses 2+ b/l Resp: CTABL, no wheezes, normal WOB Abd: +BS, soft, NTND. no guarding or organomegaly Ext: No edema, warm Neuro: Alert and oriented, strength equal b/l UE and LE, coordination grossly normal MSK: normal muscle bulk Psych: Normal affect.  No thoughts of self-harm.  Assessment & Plan:  Cameron Atkins was seen today for medical management of chronic issues.  Diagnoses and all orders for this visit:  Generalized anxiety disorder Continue below.  Needs to be seen for further refills.  3 months sent in.  Last picked up 10/29. -     LORazepam (ATIVAN) 1 MG tablet; Take 1 tablet (1 mg total) by mouth 4 (four) times daily as needed. -     hydrOXYzine (VISTARIL) 25 MG capsule; Take 1 capsule (25 mg total) by mouth every 8 (eight) hours as needed. -     Ambulatory referral to Psychiatry  Restless leg Slightly hyponatremic last check.  Follow-up blood work. -     CMP14+EGFR -     CBC with Differential/Platelet  Depression, recurrent ([place]) -     Ambulatory referral to Psychiatry  Essential hypertension Stable,  continue below. If remains hyponatremic will need to find alternative to hydrochlorothiazide -     lisinopril (PRINIVIL,ZESTRIL) 10 MG tablet; Take 1 tablet (10 mg total) by mouth daily. -     hydrochlorothiazide (HYDRODIURIL) 25 MG tablet; TAKE ONE TABLET BY MOUTH EVERY DAY  Anemia, unspecified type Last colonoscopy 2017, due for repeat 2022 -     Ferritin   Follow up plan: Return in about 3 months (around 10/31/2018). Assunta Found, MD [place]

## 2018-08-01 DIAGNOSIS — M545 Low back pain: Secondary | ICD-10-CM | POA: Diagnosis not present

## 2018-08-01 DIAGNOSIS — R262 Difficulty in walking, not elsewhere classified: Secondary | ICD-10-CM | POA: Diagnosis not present

## 2018-08-01 DIAGNOSIS — G8929 Other chronic pain: Secondary | ICD-10-CM | POA: Diagnosis not present

## 2018-08-01 LAB — CMP14+EGFR
A/G RATIO: 1.8 (ref 1.2–2.2)
ALBUMIN: 4.2 g/dL (ref 3.5–4.8)
ALT: 16 IU/L (ref 0–44)
AST: 20 IU/L (ref 0–40)
Alkaline Phosphatase: 66 IU/L (ref 39–117)
BUN / CREAT RATIO: 16 (ref 10–24)
BUN: 22 mg/dL (ref 8–27)
Bilirubin Total: 0.9 mg/dL (ref 0.0–1.2)
CALCIUM: 9.7 mg/dL (ref 8.6–10.2)
CO2: 23 mmol/L (ref 20–29)
Chloride: 94 mmol/L — ABNORMAL LOW (ref 96–106)
Creatinine, Ser: 1.36 mg/dL — ABNORMAL HIGH (ref 0.76–1.27)
GFR, EST AFRICAN AMERICAN: 59 mL/min/{1.73_m2} — AB (ref >59)
GFR, EST NON AFRICAN AMERICAN: 51 mL/min/{1.73_m2} — AB (ref >59)
GLOBULIN, TOTAL: 2.4 g/dL (ref 1.5–4.5)
Glucose: 92 mg/dL (ref 65–99)
POTASSIUM: 4.8 mmol/L (ref 3.5–5.2)
Sodium: 130 mmol/L — ABNORMAL LOW (ref 134–144)
TOTAL PROTEIN: 6.6 g/dL (ref 6.0–8.5)

## 2018-08-01 LAB — CBC WITH DIFFERENTIAL/PLATELET
BASOS: 0 %
Basophils Absolute: 0 10*3/uL (ref 0.0–0.2)
EOS (ABSOLUTE): 0.4 10*3/uL (ref 0.0–0.4)
Eos: 5 %
Hematocrit: 35.4 % — ABNORMAL LOW (ref 37.5–51.0)
Hemoglobin: 12.5 g/dL — ABNORMAL LOW (ref 13.0–17.7)
IMMATURE GRANS (ABS): 0 10*3/uL (ref 0.0–0.1)
Immature Granulocytes: 0 %
LYMPHS ABS: 2.7 10*3/uL (ref 0.7–3.1)
LYMPHS: 36 %
MCH: 29.8 pg (ref 26.6–33.0)
MCHC: 35.3 g/dL (ref 31.5–35.7)
MCV: 84 fL (ref 79–97)
Monocytes Absolute: 0.8 10*3/uL (ref 0.1–0.9)
Monocytes: 11 %
NEUTROS ABS: 3.5 10*3/uL (ref 1.4–7.0)
Neutrophils: 48 %
PLATELETS: 318 10*3/uL (ref 150–450)
RBC: 4.2 x10E6/uL (ref 4.14–5.80)
RDW: 12.7 % (ref 12.3–15.4)
WBC: 7.4 10*3/uL (ref 3.4–10.8)

## 2018-08-01 LAB — FERRITIN: Ferritin: 36 ng/mL (ref 30–400)

## 2018-08-02 ENCOUNTER — Other Ambulatory Visit: Payer: Self-pay | Admitting: Pediatrics

## 2018-08-02 ENCOUNTER — Telehealth: Payer: Self-pay

## 2018-08-02 DIAGNOSIS — I10 Essential (primary) hypertension: Secondary | ICD-10-CM

## 2018-08-02 MED ORDER — LISINOPRIL 20 MG PO TABS: 20.0000 mg | ORAL_TABLET | Freq: Every day | ORAL | 1 refills | Status: DC

## 2018-08-02 NOTE — Telephone Encounter (Signed)
2nd attempt - [facility]   

## 2018-08-03 DIAGNOSIS — G8929 Other chronic pain: Secondary | ICD-10-CM | POA: Diagnosis not present

## 2018-08-03 DIAGNOSIS — M545 Low back pain: Secondary | ICD-10-CM | POA: Diagnosis not present

## 2018-08-03 DIAGNOSIS — R262 Difficulty in walking, not elsewhere classified: Secondary | ICD-10-CM | POA: Diagnosis not present

## 2018-08-08 DIAGNOSIS — G8929 Other chronic pain: Secondary | ICD-10-CM | POA: Diagnosis not present

## 2018-08-08 DIAGNOSIS — M545 Low back pain: Secondary | ICD-10-CM | POA: Diagnosis not present

## 2018-08-08 DIAGNOSIS — R262 Difficulty in walking, not elsewhere classified: Secondary | ICD-10-CM | POA: Diagnosis not present

## 2018-08-09 ENCOUNTER — Telehealth: Payer: Self-pay | Admitting: Pediatrics

## 2018-08-09 NOTE — Telephone Encounter (Signed)
Aware. New script for lisinopril 20 was sent to pharmacy. (running out of lisinopril 10 mg).

## 2018-08-10 DIAGNOSIS — M545 Low back pain: Secondary | ICD-10-CM | POA: Diagnosis not present

## 2018-08-10 DIAGNOSIS — G8929 Other chronic pain: Secondary | ICD-10-CM | POA: Diagnosis not present

## 2018-08-10 DIAGNOSIS — R262 Difficulty in walking, not elsewhere classified: Secondary | ICD-10-CM | POA: Diagnosis not present

## 2018-08-15 ENCOUNTER — Telehealth: Payer: Self-pay

## 2018-08-15 DIAGNOSIS — M545 Low back pain: Secondary | ICD-10-CM | POA: Diagnosis not present

## 2018-08-15 DIAGNOSIS — G8929 Other chronic pain: Secondary | ICD-10-CM | POA: Diagnosis not present

## 2018-08-15 DIAGNOSIS — R262 Difficulty in walking, not elsewhere classified: Secondary | ICD-10-CM | POA: Diagnosis not present

## 2018-08-15 NOTE — Telephone Encounter (Signed)
[facility] - Left Msg 

## 2018-08-17 DIAGNOSIS — R262 Difficulty in walking, not elsewhere classified: Secondary | ICD-10-CM | POA: Diagnosis not present

## 2018-08-17 DIAGNOSIS — M545 Low back pain: Secondary | ICD-10-CM | POA: Diagnosis not present

## 2018-08-17 DIAGNOSIS — G8929 Other chronic pain: Secondary | ICD-10-CM | POA: Diagnosis not present

## 2018-08-22 DIAGNOSIS — N401 Enlarged prostate with lower urinary tract symptoms: Secondary | ICD-10-CM | POA: Diagnosis not present

## 2018-08-28 ENCOUNTER — Telehealth: Payer: Self-pay | Admitting: Licensed Clinical Social Worker

## 2018-08-28 DIAGNOSIS — F411 Generalized anxiety disorder: Secondary | ICD-10-CM

## 2018-08-28 NOTE — BH Specialist Note (Signed)
[facility] Telephone Follow-up  MRN: [id number] NAME: Cameron Atkins Date: 09/11/2018  Start time: 26 End time: 1115 Total time: 30 minutes Call number: 1/6  Reason for call today: Reason for Contact: PHQ9-8 weeks  PHQ-9 Scores:  Depression screen [facility] 2/9 07/31/2018 07/10/2018 07/05/2018 06/15/2018 05/01/2018  Decreased Interest 1 1 - 0 0  Down, Depressed, Hopeless 1 1 0 0 0  PHQ - 2 Score 2 2 0 0 0  Altered sleeping 0 0 - 0 -  Tired, decreased energy 0 0 - 0 -  Change in appetite 0 0 - 0 -  Feeling bad or failure about yourself  1 1 - 0 -  Trouble concentrating 0 0 - 0 -  Moving slowly or fidgety/restless 0 0 - 0 -  Suicidal thoughts 0 0 - 0 -  PHQ-9 Score 3 3 - 0 -  Difficult doing work/chores Somewhat difficult Not difficult at all - - -   GAD-7 Scores:  GAD 7 : Generalized Anxiety Score 07/10/2018  Nervous, Anxious, on Edge 1  Control/stop worrying 1  Worry too much - different things 1  Trouble relaxing 1  Restless 0  Easily annoyed or irritable 0  Afraid - awful might happen 1  Total GAD 7 Score 5  Anxiety Difficulty Somewhat difficult    Stress Current stressors: Current Stressors: Unknown cause Sleep: Sleep: Decreased Appetite: Appetite: No problems Coping ability: Coping ability: Normal Patient taking medications as prescribed: Patient taking medications as prescribed: Yes  Current medications:  Outpatient Encounter Medications as of 09/11/18  Medication Sig  . calcium citrate-vitamin D (CITRACAL+D) 315-200 MG-UNIT per tablet Take 1 tablet by mouth daily.   . Cholecalciferol (VITAMIN D3) 3000 units TABS Take 3,000 Units by mouth daily.  . cimetidine (TAGAMET) 400 MG tablet Take 1 tablet (400 mg total) by mouth daily as needed. (Patient taking differently: Take 200 mg by mouth at bedtime as needed (for heartburn/reflux.). )  . fish oil-omega-3 fatty acids 1000 MG capsule Take 2 g by mouth daily after breakfast.   . gabapentin (NEURONTIN) 600 MG  tablet Take 1 tablet (600 mg total) by mouth 3 (three) times daily.  . hydrOXYzine (VISTARIL) 25 MG capsule Take 1 capsule (25 mg total) by mouth every 8 (eight) hours as needed.  Marland Kitchen lisinopril (PRINIVIL,ZESTRIL) 20 MG tablet Take 1 tablet (20 mg total) by mouth daily.  Marland Kitchen LORazepam (ATIVAN) 1 MG tablet Take 1 tablet (1 mg total) by mouth 4 (four) times daily as needed.  . Multiple Vitamin (MULTIVITAMIN) tablet Take 1 tablet by mouth daily.  . naproxen (NAPROSYN) 500 MG tablet Take 1 tablet (500 mg total) by mouth 2 (two) times daily.  . pantoprazole (PROTONIX) 40 MG tablet TAKE ONE TABLET BY MOUTH EVERY MORNING  . rOPINIRole (REQUIP) 0.25 MG tablet Take 1 tab before bed for 1 week, then take 2 tabs.   No facility-administered encounter medications on file as of 09-11-18.      Self-harm Behaviors Risk Assessment Self-harm risk factors: Self-harm risk factors: Social withdrawal/isolation Patient endorses recent thoughts of harming self: Have you recently had any thoughts about harming yourself?: No  [place] Suicide Severity Rating Scale: No flowsheet data found. C-SRSS 07/10/2018 09/11/2018 2018-09-11 2018-09-11  1. Wish to be Dead No No No No  2. Suicidal Thoughts No No No No  6. Suicide Behavior Question No No No No     Danger to Others Risk Assessment Danger to others risk factors: Danger to Others  Risk Factors: No risk factors noted Patient endorses recent thoughts of harming others: Notification required: No need or identified person  Dynamic Appraisal of Situational Aggression (DASA): No flowsheet data found.   Substance Use Assessment Patient recently consumed alcohol: Have you recently consumed alcohol?: No  Alcohol Use Disorder Identification Test (AUDIT):  Alcohol Use Disorder Test (AUDIT) 07/10/2018  1. How often do you have a drink containing alcohol? 0  2. How many drinks containing alcohol do you have on a typical day when you are drinking? 0  3. How often do you have  six or more drinks on one occasion? 0  AUDIT-C Score 0  Intervention/Follow-up AUDIT Score <7 follow-up not indicated   Patient recently used drugs: Have you recently used any drugs?: No  Opioid Risk Assessment:    Goals, Interventions and Follow-up Plan Goals: Increase adequate support systems for patient/family Interventions: Motivational Interviewing, Solution-Focused Strategies, Mindfulness or Psychologist, educational, Behavioral Activation, Brief CBT, Supportive Counseling, Medication Monitoring, Functional Assessment of ADLs, Sleep Hygiene and Psychoeducation and/or Health Education Follow-up Plan: Refer to [facility] Out[facility]  Summary: Cameron Atkins for a VB follow up call. He reported feeling "fine" today. He reports some mild depression and anxiety today. However stated, "It's manageable and better". He reports feeling motivated to improve his symptoms. He reported that he takes his medications as prescribed and feels as if it's helping his depressive/anxiety related symptoms. Counselor encouraged client to find support in his reported family members and/or community resources. Patient reported that he felt ok and wanted to try focusing on his own independent solutions/coping skills first. Patient denied SI, HI, and AVH's. Client recommended to follow up coping skills discussed during our phone conversation (mindfulness, relaxation training, etc). Also, encouraged him to continue following up with medication management and also utilize his primary support system. [place] counselor to continue following up with patient.  Waldon Merl, Counselor

## 2018-08-29 DIAGNOSIS — M545 Low back pain: Secondary | ICD-10-CM | POA: Diagnosis not present

## 2018-08-29 DIAGNOSIS — R262 Difficulty in walking, not elsewhere classified: Secondary | ICD-10-CM | POA: Diagnosis not present

## 2018-08-29 DIAGNOSIS — G8929 Other chronic pain: Secondary | ICD-10-CM | POA: Diagnosis not present

## 2018-08-31 ENCOUNTER — Telehealth: Payer: Self-pay

## 2018-08-31 DIAGNOSIS — N200 Calculus of kidney: Secondary | ICD-10-CM | POA: Diagnosis not present

## 2018-08-31 DIAGNOSIS — R3912 Poor urinary stream: Secondary | ICD-10-CM | POA: Diagnosis not present

## 2018-08-31 DIAGNOSIS — N401 Enlarged prostate with lower urinary tract symptoms: Secondary | ICD-10-CM | POA: Diagnosis not present

## 2018-08-31 NOTE — Telephone Encounter (Signed)
Patient continues to state that he does not want to schedule an appointment with a psychiatrist.

## 2018-09-04 ENCOUNTER — Telehealth: Payer: Self-pay

## 2018-09-04 DIAGNOSIS — F411 Generalized anxiety disorder: Secondary | ICD-10-CM

## 2018-09-05 DIAGNOSIS — R262 Difficulty in walking, not elsewhere classified: Secondary | ICD-10-CM | POA: Diagnosis not present

## 2018-09-05 DIAGNOSIS — G8929 Other chronic pain: Secondary | ICD-10-CM | POA: Diagnosis not present

## 2018-09-05 DIAGNOSIS — M545 Low back pain: Secondary | ICD-10-CM | POA: Diagnosis not present

## 2018-09-07 DIAGNOSIS — M545 Low back pain: Secondary | ICD-10-CM | POA: Diagnosis not present

## 2018-09-07 DIAGNOSIS — R262 Difficulty in walking, not elsewhere classified: Secondary | ICD-10-CM | POA: Diagnosis not present

## 2018-09-07 DIAGNOSIS — G8929 Other chronic pain: Secondary | ICD-10-CM | POA: Diagnosis not present

## 2018-09-11 ENCOUNTER — Other Ambulatory Visit: Payer: Self-pay | Admitting: Pediatrics

## 2018-09-11 DIAGNOSIS — F411 Generalized anxiety disorder: Secondary | ICD-10-CM

## 2018-09-12 ENCOUNTER — Telehealth: Payer: Self-pay

## 2018-09-12 DIAGNOSIS — R262 Difficulty in walking, not elsewhere classified: Secondary | ICD-10-CM | POA: Diagnosis not present

## 2018-09-12 DIAGNOSIS — G8929 Other chronic pain: Secondary | ICD-10-CM | POA: Diagnosis not present

## 2018-09-12 DIAGNOSIS — M545 Low back pain: Secondary | ICD-10-CM | POA: Diagnosis not present

## 2018-09-12 NOTE — BH Specialist Note (Signed)
[facility] - Patient continues to state that he does not want to schedule an appointment with a psychiatrist.

## 2018-09-12 NOTE — Telephone Encounter (Signed)
[facility] - Left Message  

## 2018-09-14 DIAGNOSIS — G8929 Other chronic pain: Secondary | ICD-10-CM | POA: Diagnosis not present

## 2018-09-14 DIAGNOSIS — M545 Low back pain: Secondary | ICD-10-CM | POA: Diagnosis not present

## 2018-09-14 DIAGNOSIS — R262 Difficulty in walking, not elsewhere classified: Secondary | ICD-10-CM | POA: Diagnosis not present

## 2018-09-19 DIAGNOSIS — M545 Low back pain: Secondary | ICD-10-CM | POA: Diagnosis not present

## 2018-09-19 DIAGNOSIS — G8929 Other chronic pain: Secondary | ICD-10-CM | POA: Diagnosis not present

## 2018-09-19 DIAGNOSIS — R262 Difficulty in walking, not elsewhere classified: Secondary | ICD-10-CM | POA: Diagnosis not present

## 2018-09-21 DIAGNOSIS — M545 Low back pain: Secondary | ICD-10-CM | POA: Diagnosis not present

## 2018-09-21 DIAGNOSIS — R262 Difficulty in walking, not elsewhere classified: Secondary | ICD-10-CM | POA: Diagnosis not present

## 2018-09-21 DIAGNOSIS — G8929 Other chronic pain: Secondary | ICD-10-CM | POA: Diagnosis not present

## 2018-10-09 ENCOUNTER — Other Ambulatory Visit: Payer: Self-pay | Admitting: Pediatrics

## 2018-10-09 DIAGNOSIS — G2581 Restless legs syndrome: Secondary | ICD-10-CM

## 2018-10-16 ENCOUNTER — Other Ambulatory Visit: Payer: Self-pay | Admitting: Pediatrics

## 2018-10-16 DIAGNOSIS — F411 Generalized anxiety disorder: Secondary | ICD-10-CM

## 2018-10-18 ENCOUNTER — Ambulatory Visit (INDEPENDENT_AMBULATORY_CARE_PROVIDER_SITE_OTHER): Payer: Medicare Other | Admitting: Family Medicine

## 2018-10-18 ENCOUNTER — Encounter: Payer: Self-pay | Admitting: Family Medicine

## 2018-10-18 ENCOUNTER — Ambulatory Visit: Payer: Medicare Other | Admitting: Pediatrics

## 2018-10-18 VITALS — BP 148/102 | HR 94 | Temp 97.0°F | Ht 66.0 in | Wt 159.0 lb

## 2018-10-18 DIAGNOSIS — Z79899 Other long term (current) drug therapy: Secondary | ICD-10-CM | POA: Insufficient documentation

## 2018-10-18 DIAGNOSIS — I10 Essential (primary) hypertension: Secondary | ICD-10-CM

## 2018-10-18 DIAGNOSIS — F419 Anxiety disorder, unspecified: Secondary | ICD-10-CM | POA: Diagnosis not present

## 2018-10-18 DIAGNOSIS — F1320 Sedative, hypnotic or anxiolytic dependence, uncomplicated: Secondary | ICD-10-CM | POA: Diagnosis not present

## 2018-10-18 NOTE — Progress Notes (Signed)
Subjective: CC: HTN PCP: Eustaquio Maize, MD BJS:[id number] A Tokar is a 73 y.o. male presenting to clinic today for:  1. HTN Patient was to follow-up for high blood pressure but has multiple concerns during today's visit.  He reports that blood pressures tend to fluctuate between 616 systolic to 073 [id number].  He checks blood pressures up to 6 times daily.  He denies any chest pain but does note occasional chest tightness which he associates with anxiety.  No shortness of breath, lower extremity edema or loss of consciousness.  2.  Anxiety disorder Patient prescribed Ativan 1 mg 4 times daily.  He notes that he takes up to 2-1/2 mg nightly and tries to use the rest of his Ativan sparingly throughout the day.  He reports intolerance to multiple SSRIs in the past and intolerance to other benzodiazepines.  Though goes on to state that he does not find that the Ativan is as efficacious as it was when he first started it but it still works better than nothing.  He denies any excessive sedation, shortness of breath, falls or loss of consciousness related to benzodiazepine use.  At last visit, his PCP recommended that he follow-up with psychiatry for ongoing treatment of uncontrolled anxiety disorder.  He was contacted for an appointment but declined to the appointment because he "was not ready to talk to anyone yet".  He seems amenable to doing this now.  He goes on to state that he has irregular sleep cycle and has difficulty falling asleep and with waking up multiple times per night.  Sometimes this result in him not falling asleep until after the sun comes up and he subsequently sleeps well into the day.   ROS: Per HPI  Allergies  Allergen Reactions  . Benadryl [Diphenhydramine] Other (See Comments)    anxious  . Hctz [Hydrochlorothiazide]     hyponatremia  . Mepergan [Meperidine-Promethazine]     Vomiting.   . Other     Antidepressants--per patient he cannot take.  Marland Kitchen Oxycodone Itching      hyperactivity  . Amoxicillin     Has patient had a PCN reaction causing immediate rash, facial/tongue/throat swelling, SOB or lightheadedness with hypotension:No Has patient had a PCN reaction causing severe rash involving mucus membranes or skin necrosis:No Has patient had a PCN reaction that required hospitalization:No Has patient had a PCN reaction occurring within the last 10 years:Yes If all of the above answers are "NO", then may proceed with Cephalosporin use. "severe anxiety-ativan does not remedy"  . Doxycycline Anxiety  . Flagyl [Metronidazole] Anxiety  . Singulair [Montelukast Sodium] Anxiety   Past Medical History:  Diagnosis Date  . Anxiety   . Arthritis   . Bladder stone   . Depression   . Diverticulosis of colon   . GERD (gastroesophageal reflux disease)   . H/O hiatal hernia   . Helicobacter pylori gastritis   . History of colonoscopy with polypectomy    TUBULAR ADENOMA-- 2012  . History of esophageal dilatation    STRICTURE--  LAST DONE 03/2013  . Hypertension    for 5 yrs  . Left ureteral calculus   . Osteoporosis   . Wears glasses     Current Outpatient Medications:  .  calcium citrate-vitamin D (CITRACAL+D) 315-200 MG-UNIT per tablet, Take 1 tablet by mouth daily. , Disp: , Rfl:  .  Cholecalciferol (VITAMIN D3) 3000 units TABS, Take 3,000 Units by mouth daily., Disp: , Rfl:  .  cimetidine (TAGAMET) 400  MG tablet, Take 1 tablet (400 mg total) by mouth daily as needed. (Patient taking differently: Take 200 mg by mouth at bedtime as needed (for heartburn/reflux.). ), Disp: 30 tablet, Rfl: 5 .  fish oil-omega-3 fatty acids 1000 MG capsule, Take 2 g by mouth daily after breakfast. , Disp: , Rfl:  .  gabapentin (NEURONTIN) 600 MG tablet, TAKE 1 TABLET BY MOUTH THREE TIMES DAILY, Disp: 90 tablet, Rfl: 0 .  hydrOXYzine (VISTARIL) 25 MG capsule, Take 1 capsule (25 mg total) by mouth every 8 (eight) hours as needed., Disp: 90 capsule, Rfl: 3 .  lisinopril  (PRINIVIL,ZESTRIL) 20 MG tablet, Take 1 tablet (20 mg total) by mouth daily., Disp: 90 tablet, Rfl: 1 .  LORazepam (ATIVAN) 1 MG tablet, Take 1 tablet (1 mg total) by mouth 4 (four) times daily as needed., Disp: 120 tablet, Rfl: 2 .  Multiple Vitamin (MULTIVITAMIN) tablet, Take 1 tablet by mouth daily., Disp: , Rfl:  .  naproxen (NAPROSYN) 500 MG tablet, Take 1 tablet (500 mg total) by mouth 2 (two) times daily., Disp: 30 tablet, Rfl: 0 .  pantoprazole (PROTONIX) 40 MG tablet, TAKE ONE TABLET BY MOUTH EVERY MORNING, Disp: 30 tablet, Rfl: 11 .  rOPINIRole (REQUIP) 0.25 MG tablet, TAKE 1 TABLET BY MOUTH BEFORE bed FOR ONE WEEK, THEN TAKE TWO TABLETS DAILY, Disp: 60 tablet, Rfl: 3 Social History   Socioeconomic History  . Marital status: Married    Spouse name: Not on file  . Number of children: Not on file  . Years of education: Not on file  . Highest education level: Not on file  Occupational History  . Not on file  Social Needs  . Financial resource strain: Not on file  . Food insecurity:    Worry: Not on file    Inability: Not on file  . Transportation needs:    Medical: Not on file    Non-medical: Not on file  Tobacco Use  . Smoking status: Former Smoker    Packs/day: 0.25    Years: 2.00    Pack years: 0.50    Types: Cigarettes    Last attempt to quit: 01/14/1969    Years since quitting: 49.7  . Smokeless tobacco: Former Systems developer    Types: [place] date: 01/15/1999  Substance and Sexual Activity  . Alcohol use: Yes    Alcohol/week: 21.0 standard drinks    Types: 21 Cans of beer per week    Comment: 1-2 beer daily  . Drug use: No  . Sexual activity: Never    Birth control/protection: None  Lifestyle  . Physical activity:    Days per week: Not on file    Minutes per session: Not on file  . Stress: Not on file  Relationships  . Social connections:    Talks on phone: Not on file    Gets together: Not on file    Attends religious service: Not on file    Active member  of club or organization: Not on file    Attends meetings of clubs or organizations: Not on file    Relationship status: Not on file  . Intimate partner violence:    Fear of current or ex partner: Not on file    Emotionally abused: Not on file    Physically abused: Not on file    Forced sexual activity: Not on file  Other Topics Concern  . Not on file  Social History Narrative  . Not on file  No family history on file.  Objective: Office vital signs reviewed. BP (!) 148/102   Pulse 94   Temp (!) 97 F (36.1 C) (Oral)   Ht 5\' 6"  (1.676 m)   Wt 159 lb (72.1 kg)   BMI 25.66 kg/m   Physical Examination:  General: Awake, alert, well appearing elderly man, No acute distress HEENT: Normal, sclera white, MMM Cardio: regular rate and rhythm, S1S2 heard, no murmurs appreciated Pulm: clear to auscultation bilaterally, no wheezes, rhonchi or rales; normal work of breathing on room air Extremities: warm, well perfused, No edema, cyanosis or clubbing; +2 pulses bilaterally Psych: Pressured speech with some tangential thought.  Affect is appropriate.  Does not appear to be responding to internal stimuli.  He appears anxious during exam.  Depression screen [facility] 2/9 10/18/2018 09/04/2018 07/31/2018  Decreased Interest 2 1 1   Down, Depressed, Hopeless 1 1 1   PHQ - 2 Score 3 2 2   Altered sleeping 3 1 0  Tired, decreased energy 1 1 0  Change in appetite 3 2 0  Feeling bad or failure about yourself  0 0 1  Trouble concentrating 1 1 0  Moving slowly or fidgety/restless 1 0 0  Suicidal thoughts 0 0 0  PHQ-9 Score 12 7 3   Difficult doing work/chores - Somewhat difficult Somewhat difficult  Some recent data might be hidden   GAD 7 : Generalized Anxiety Score 09/04/2018 07/10/2018  Nervous, Anxious, on Edge 1 1  Control/stop worrying 1 1  Worry too much - different things 1 1  Trouble relaxing 1 1  Restless 0 0  Easily annoyed or irritable 0 0  Afraid - awful might happen 1 1  Total GAD 7  Score 5 5  Anxiety Difficulty Somewhat difficult Somewhat difficult    Assessment/ Plan: 73 y.o. male   1. Essential hypertension Not at goal though I think that anxiety is playing a large part.  For now, we will avoid changing blood pressure remedy.  I have strongly encouraged him to schedule an appointment with psychiatry.  He will follow-up in 1 month, sooner if needed, for blood pressure recheck and recheck of anxiety.  He is to continue monitoring blood pressures at home but have asked him to check only twice daily unless he is having abnormal symptoms.  We discussed appropriate manner to check blood pressures and a handout was provided further demonstrating this.  If blood pressure augmentation is needed, we will plan to increase lisinopril to 40 mg daily.  2. Anxiety Very anxietal during today's exam.  His speech was extremely pressured and thoughts tangential.  I again have reiterated that he should absolutely see psychiatry.  We discussed the general nature of benzodiazepine use and the ultimate tolerance of the medications with need for continued increase of dose.  We discussed that this is a high risk medication given age.  I would certainly consider revisiting alternative medications.  As this is my first time meeting this patient and this issue was something outside of what the intended appointment was for, I am not entirely sure what he is taking in the past and what he is failed.  We will continue to work closely with psychiatry with regards to this.  He is aware of referral and need to see the psychiatrist.  I will also reach out to our clinical social worker who may be able to offer additional counseling services to the patient. - Ambulatory referral to Psychiatry  3. Benzodiazepine dependence ([place]) As above -  Ambulatory referral to Psychiatry  4. High risk medication use As above - Ambulatory referral to Psychiatry   Orders Placed This Encounter  Procedures  . Ambulatory  referral to Psychiatry    Referral Priority:   Routine    Referral Type:   Psychiatric    Referral Reason:   Specialty Services Required    Requested Specialty:   Psychiatry    Number of Visits Requested:   1   No orders of the defined types were placed in this encounter.   Total time spent with patient 37 minutes.  Greater than 50% of encounter spent in coordination of care/counseling.  Janora Norlander, DO [place] [phone]

## 2018-10-18 NOTE — Patient Instructions (Signed)
I want you to follow up in 1 month for blood pressure and anxiety.  I have placed a new referral to psychiatry for an appointment. We discussed that Ativan is a high risk medication.  We discussed how to check blood pressure appropriately.  I want you check your blood pressure no more than 2 times per day.  Choose 2 times during the day and check it around the same time each day and record this and bring this to the next visit.  We will adjust her blood pressure medications accordingly.  I do think that your anxiety is impacting your blood pressure. How to Take Your Blood Pressure You can take your blood pressure at home with a machine. You may need to check your blood pressure at home:  To check if you have high blood pressure (hypertension).  To check your blood pressure over time.  To make sure your blood pressure medicine is working. Supplies needed: You will need a blood pressure machine, or monitor. You can buy one at a drugstore or online. When choosing one:  Choose one with an arm cuff.  Choose one that wraps around your upper arm. Only one finger should fit between your arm and the cuff.  Do not choose one that measures your blood pressure from your wrist or finger. Your doctor can suggest a monitor. How to prepare Avoid these things for 30 minutes before checking your blood pressure:  Drinking caffeine.  Drinking alcohol.  Eating.  Smoking.  Exercising. Five minutes before checking your blood pressure:  Pee.  Sit in a dining chair. Avoid sitting in a soft couch or armchair.  Be quiet. Do not talk. How to take your blood pressure Follow the instructions that came with your machine. If you have a digital blood pressure monitor, these may be the instructions: 1. Sit up straight. 2. Place your feet on the floor. Do not cross your ankles or legs. 3. Rest your left arm at the level of your heart. You may rest it on a table, desk, or chair. 4. Pull up your shirt  sleeve. 5. Wrap the blood pressure cuff around the upper part of your left arm. The cuff should be 1 inch (2.5 cm) above your elbow. It is best to wrap the cuff around bare skin. 6. Fit the cuff snugly around your arm. You should be able to place only one finger between the cuff and your arm. 7. Put the cord inside the groove of your elbow. 8. Press the power button. 9. Sit quietly while the cuff fills with air and loses air. 10. Write down the numbers on the screen. 11. Wait 2-3 minutes and then repeat steps 1-10. What do the numbers mean? Two numbers make up your blood pressure. The first number is called systolic pressure. The second is called diastolic pressure. An example of a blood pressure reading is "120 over 80" (or 120/80). If you are an adult and do not have a medical condition, use this guide to find out if your blood pressure is normal: Normal  First number: below 120.  Second number: below 80. Elevated  First number: 120-129.  Second number: below 80. Hypertension stage 1  First number: 130-139.  Second number: 80-89. Hypertension stage 2  First number: 140 or above.  Second number: 66 or above. Your blood pressure is above normal even if only the top or bottom number is above normal. Follow these instructions at home:  Check your blood pressure as often  as your doctor tells you to.  Take your monitor to your next doctor's appointment. Your doctor will: ? Make sure you are using it correctly. ? Make sure it is working right.  Make sure you understand what your blood pressure numbers should be.  Tell your doctor if your medicines are causing side effects. Contact a doctor if:  Your blood pressure keeps being high. Get help right away if:  Your first blood pressure number is higher than 180.  Your second blood pressure number is higher than 120. This information is not intended to replace advice given to you by your health care provider. Make sure you  discuss any questions you have with your health care provider. Document Released: 08/26/2008 Document Revised: 08/11/2016 Document Reviewed: 02/20/2016 Elsevier Interactive Patient Education  2019 [employer].

## 2018-10-19 ENCOUNTER — Telehealth: Payer: Medicare Other

## 2018-10-19 ENCOUNTER — Ambulatory Visit (INDEPENDENT_AMBULATORY_CARE_PROVIDER_SITE_OTHER): Payer: Medicare Other | Admitting: Licensed Clinical Social Worker

## 2018-10-19 ENCOUNTER — Telehealth: Payer: No Typology Code available for payment source

## 2018-10-19 DIAGNOSIS — F339 Major depressive disorder, recurrent, unspecified: Secondary | ICD-10-CM

## 2018-10-19 DIAGNOSIS — F1320 Sedative, hypnotic or anxiolytic dependence, uncomplicated: Secondary | ICD-10-CM

## 2018-10-19 DIAGNOSIS — F419 Anxiety disorder, unspecified: Secondary | ICD-10-CM

## 2018-10-19 DIAGNOSIS — I10 Essential (primary) hypertension: Secondary | ICD-10-CM | POA: Diagnosis not present

## 2018-10-19 NOTE — Patient Instructions (Addendum)
Licensed Clinical Social Worker Visit Information  Goals we discussed today:  Goals Addressed    . "I need help in managing anxiety" (pt-stated)       Current Barriers:  . Mental Health Concerns (Anxiety Disorder)  Clinical Social Work Clinical Goal(s): Over the next 30 days, client will work with LCSW to address concerns related to management of anxiety symptoms of client  Interventions: . Client interviewed via phone related to psychosocial needs of client.  . LCSW discussed CCM program services with client.  . LCSW talked with client about management of anxiety symptoms of client and approaches for managing anxiety symptoms.  .  Patient Self Care Activities:  . Self administers medications as prescribed. . Attends all scheduled provider appointments . Talks openly about anxiety symptoms management  Plan:  . LCSW to call client in 2 weeks to further discuss management of anxiety symptoms of client. . Client to call LCSW as needed to discuss psychosocial needs of client. . Client to contact PCP as needed to discuss medical needs of client.   *initial goal documentation        Cameron Atkins was given information about Chronic Care Management services today including:  1. CCM service includes personalized support from designated clinical staff supervised by his physician, including individualized plan of care and coordination with other care providers 2. 24/7 contact phone numbers for assistance for urgent and routine care needs. 3. Service will only be billed when office clinical staff spend 20 minutes or more in a month to coordinate care. 4. Only one practitioner may furnish and bill the service in a calendar month. 5. The patient may stop CCM services at any time (effective at the end of the month) by phone call to the office staff. 6. The patient will be responsible for cost sharing (co-pay) of up to 20% of the service fee (after annual deductible is met).  Patient agreed to  services and verbal consent obtained.   The patient verbalized understanding of instructions provided today and declined a print copy of patient instruction materials.   Follow up plan: Appointment scheduled for LCSW follow up with client by phone on: 10/30/2018   Cameron Atkins MSW, LCSW Licensed Clinical Social Worker [facility] [phone]  

## 2018-10-19 NOTE — Chronic Care Management (AMB) (Signed)
Chronic Care Management    Clinical Social Work General Note  10/19/2018 Name: Cameron Atkins MRN: [id number] DOB: Aug 15, 1946   Referred by: Dr. Ronnie Doss for psychosocial assessment.   Cameron Atkins was given information about Chronic Care Management services today including:  1. CCM service includes personalized support from designated clinical staff supervised by his physician, including individualized plan of care and coordination with other care providers 2. 24/7 contact phone numbers for assistance for urgent and routine care needs. 3. Service will only be billed when office clinical staff spend 20 minutes or more in a month to coordinate care. 4. Only one practitioner may furnish and bill the service in a calendar month. 5. The patient may stop CCM services at any time (effective at the end of the month) by phone call to the office staff. 6. The patient will be responsible for cost sharing (co-pay) of up to 20% of the service fee (after annual deductible is met).  Patient agreed to services and verbal consent obtained.   Review of patient status, including review of consultants reports, relevant laboratory and other test results, and collaboration with appropriate care team members and the patient's provider was performed as part of comprehensive patient evaluation and provision of chronic care management services.    Last CCM Appointment: 10/19/2018  Depression screen [facility] 2/9 10/18/2018 09/04/2018 07/31/2018  Decreased Interest '2 1 1  '$ Down, Depressed, Hopeless '1 1 1  '$ PHQ - 2 Score '3 2 2  '$ Altered sleeping 3 1 0  Tired, decreased energy 1 1 0  Change in appetite 3 2 0  Feeling bad or failure about yourself  0 0 1  Trouble concentrating 1 1 0  Moving slowly or fidgety/restless 1 0 0  Suicidal thoughts 0 0 0  PHQ-9 Score '12 7 3  '$ Difficult doing work/chores - Somewhat difficult Somewhat difficult  Some recent data might be hidden     GAD 7 : Generalized Anxiety Score 09/04/2018  07/10/2018  Nervous, Anxious, on Edge 1 1  Control/stop worrying 1 1  Worry too much - different things 1 1  Trouble relaxing 1 1  Restless 0 0  Easily annoyed or irritable 0 0  Afraid - awful might happen 1 1  Total GAD 7 Score 5 5  Anxiety Difficulty Somewhat difficult Somewhat difficult     Goals Addressed    . "I need help in managing anxiety" (pt-stated)       Current Barriers:  Marland Kitchen Mental Health Concerns (Anxiety Disorder)  Clinical Social Work Clinical Goal(s): Over the next 30 days, client will work with LCSW to address concerns related to management of anxiety symptoms of client  Interventions: . Client interviewed via phone related to psychosocial needs of client.  Marland Kitchen LCSW discussed CCM program services with client.  Marland Kitchen LCSW talked with client about management of anxiety symptoms of client and approaches for managing anxiety symptoms.  .  Patient Self Care Activities:  . Self administers medications as prescribed. . Attends all scheduled provider appointments . Talks openly about anxiety symptoms management  Plan:  . LCSW to call client in 2 weeks to further discuss management of anxiety symptoms of client. . Client to call LCSW as needed to discuss psychosocial needs of client. . Client to contact PCP as needed to discuss medical needs of client.   *initial goal documentation       Follow Up Counseling Assessment/Needs:  LCSW discussed psychiatric appointment for client as recommended by PCP . Client  did not agree to psychiatric appointment but did agree to counseling support with LCSW through CCM program .  Norva Riffle.Lindy Pennisi MSW, LCSW Licensed Clinical Social Worker [facility] Care [facility] [phone]

## 2018-10-20 ENCOUNTER — Telehealth: Payer: Self-pay | Admitting: Family Medicine

## 2018-10-20 NOTE — Telephone Encounter (Signed)
Pt advised to ask pharmacist which brand of BP cuff they recommend. Pt voiced understanding.

## 2018-10-30 ENCOUNTER — Ambulatory Visit (INDEPENDENT_AMBULATORY_CARE_PROVIDER_SITE_OTHER): Payer: Medicare Other | Admitting: Licensed Clinical Social Worker

## 2018-10-30 DIAGNOSIS — F339 Major depressive disorder, recurrent, unspecified: Secondary | ICD-10-CM

## 2018-10-30 DIAGNOSIS — F419 Anxiety disorder, unspecified: Secondary | ICD-10-CM

## 2018-10-30 DIAGNOSIS — I10 Essential (primary) hypertension: Secondary | ICD-10-CM

## 2018-10-30 DIAGNOSIS — F1320 Sedative, hypnotic or anxiolytic dependence, uncomplicated: Secondary | ICD-10-CM

## 2018-10-30 NOTE — Chronic Care Management (AMB) (Signed)
  Chronic Care Management    Clinical Social Work General Follow Up Note  10/30/2018 Name: FLETCHER OSTERMILLER MRN: [id number] DOB: [birth year]   Referred by: PCP, Madaline Guthrie for psychosocial support and counseling support  Review of patient status, including review of consultants reports, relevant laboratory and other test results, and collaboration with appropriate care team members and the patient's provider was performed as part of comprehensive patient evaluation and provision of chronic care management services.    Last CCM Appointment:  10/30/2018  Depression screen [facility] 2/9 10/18/2018  Decreased Interest 2  Down, Depressed, Hopeless 1  PHQ - 2 Score 3  Altered sleeping 3  Tired, decreased energy 1  Change in appetite 3  Feeling bad or failure about yourself  0  Trouble concentrating 1  Moving slowly or fidgety/restless 1  Suicidal thoughts 0  PHQ-9 Score 12  Difficult doing work/chores -  Some recent data might be hidden     GAD 7 : Generalized Anxiety Score 09/04/2018 07/10/2018  Nervous, Anxious, on Edge 1 1  Control/stop worrying 1 1  Worry too much - different things 1 1  Trouble relaxing 1 1  Restless 0 0  Easily annoyed or irritable 0 0  Afraid - awful might happen 1 1  Total GAD 7 Score 5 5  Anxiety Difficulty Somewhat difficult Somewhat difficult     Goals    . "I need help in managing anxiety" (pt-stated)     Current Barriers:  Marland Kitchen Mental Health Concerns (Anxiety Disorder)  Clinical Social Work Clinical Goal(s): Over the next 30 days, client will work with LCSW to address concerns related to management of anxiety symptoms of client  Interventions: . Client interviewed via phone related to psychosocial needs of client.  Marland Kitchen LCSW talked with client about management of anxiety symptoms of client and approaches for managing anxiety symptoms.  Marland Kitchen LCSW talked with client about client social support system . LCSW provided counseling support for client  Patient  Self Care Activities:   . Self administers medications as prescribed. .   Attends all scheduled provider appointments. . Talks openly about anxiety symptoms management . Attends [employer] to exercise 2 times weekly  Plan:  . LCSW to call client in 2 weeks to further discuss management of anxiety symptoms of client. . Client to call LCSW as needed to discuss psychosocial needs of client. . Client to contact PCP as needed to discuss medical needs of client.  . Client to continue to talk with LCSW as needed about approaches for managing anxiety symptoms of client  *initial goal documentation       Follow Up Plan: LCSW to call client in 2 weeks to discuss management by client of client's anxiety symptoms faced    Norva Riffle.Glynda Soliday MSW, LCSW Licensed Clinical Social Worker [facility] [phone]

## 2018-10-30 NOTE — Patient Instructions (Signed)
Licensed Clinical Social Worker Visit Information  Materials Provided:  No  Goals we discussed today:             Goals             . "I need help in managing anxiety" (pt-stated)      Current Barriers:   Mental Health Concerns (Anxiety Disorder)  Clinical Social Work Clinical Goal(s): Over the next 30 days, client will work with LCSW to address concerns related to management of anxiety symptoms of client  Interventions:  Client interviewed via phone related to psychosocial needs of client.   LCSW talked with client about management of anxiety symptoms of client and approaches for managing anxiety symptoms.   LCSW talked with client about client social support system  LCSW provided counseling support for client  Patient Self Care Activities:    Self administers medications as prescribed.    Attends all scheduled provider appointments.  Talks openly about anxiety symptoms management  Attends [employer] to exercise 2 times weekly  Plan:   LCSW to call client in 2 weeks to further discuss management of anxiety symptoms of client.  Client to call LCSW as needed to discuss psychosocial needs of client.  Client to contact PCP as needed to discuss medical needs of client.   Client to continue to talk with LCSW as needed about approaches for managing anxiety symptoms of client  *initial goal documentation       Follow Up Plan: LCSW to call client in 2 weeks to discuss management by client of client's anxiety symptoms faced  The patient verbalized understanding of instructions provided today and declined a print copy of patient instruction materials.    Norva Riffle.Lenise Jr MSW, LCSW Licensed Clinical Social Worker [facility] [phone]

## 2018-11-20 ENCOUNTER — Ambulatory Visit (INDEPENDENT_AMBULATORY_CARE_PROVIDER_SITE_OTHER): Payer: Medicare Other | Admitting: Family Medicine

## 2018-11-20 VITALS — BP 138/75 | HR 95 | Temp 98.2°F | Ht 66.0 in | Wt 165.0 lb

## 2018-11-20 DIAGNOSIS — I10 Essential (primary) hypertension: Secondary | ICD-10-CM

## 2018-11-20 DIAGNOSIS — F1320 Sedative, hypnotic or anxiolytic dependence, uncomplicated: Secondary | ICD-10-CM

## 2018-11-20 DIAGNOSIS — Z79899 Other long term (current) drug therapy: Secondary | ICD-10-CM

## 2018-11-20 DIAGNOSIS — F419 Anxiety disorder, unspecified: Secondary | ICD-10-CM

## 2018-11-20 MED ORDER — MIRTAZAPINE 15 MG PO TABS: 15.0000 mg | ORAL_TABLET | Freq: Every day | ORAL | 1 refills | Status: DC

## 2018-11-20 MED ORDER — LORAZEPAM 1 MG PO TABS: 1.0000 mg | ORAL_TABLET | Freq: Four times a day (QID) | ORAL | 0 refills | Status: DC | PRN

## 2018-11-20 NOTE — Progress Notes (Signed)
Subjective: CC: HTN, anxiety disorder PCP: Janora Norlander, DO VOH:[id number] Cameron Atkins is Cameron 73 y.o. male presenting to clinic today for:  1. HTN Patient reports compliance with lisinopril 20 mg daily.  He continues to have intermittent chest pressure that he attributes to anxiety/panic.  These are very rare but do occur.  No associated nausea, vomiting, shortness of breath.  He brings in blood pressure readings with his new blood pressure cuff, which typically range between 1 62-6 40 systolics over 94W to 54O diastolics.  He occasionally does have Cameron measurement in the 270 systolic.  2.  Anxiety disorder Patient reports feeling much better than previous exam.  He reports he was "worked up" last visit because he was running behind.  He continues to take Ativan 1 mg up to 4 times daily and shows me Cameron bottle in which he keeps his supply for each day.  Most of the medication continues to be predominantly at nighttime which he takes around 7, midnight and sometimes 4 AM if he wakes up.  He goes on to reinforce that he uses this medication very responsibly.  Per the chart, he has had intolerance to multiple SSRIs does not think that he is ever been on mirtazapine when this was offered to him.  He has been talking to [place], the clinical social worker here in office, since our last visit.  He is also gone ahead and schedule an appoint with Cameron psychiatrist to touch base with them since this is been recommended to him on multiple occasions.  He denies any respiratory depression, falls, excessive daytime sleepiness.  He continues to drink several cups of coffee per day but finds that it is not stimulating and if anything it is sedating.  He continues to have panic symptoms predominantly in the evening and night times, when he is not busy and finds that things are quieter.   ROS: Per HPI  Allergies  Allergen Reactions  . Benadryl [Diphenhydramine] Other (See Comments)    anxious  . Hctz [Hydrochlorothiazide]      hyponatremia  . Mepergan [Meperidine-Promethazine]     Vomiting.   . Other     Antidepressants--per patient he cannot take.  Marland Kitchen Oxycodone Itching    hyperactivity  . Amoxicillin     Has patient had Cameron PCN reaction causing immediate rash, facial/tongue/throat swelling, SOB or lightheadedness with hypotension:No Has patient had Cameron PCN reaction causing severe rash involving mucus membranes or skin necrosis:No Has patient had Cameron PCN reaction that required hospitalization:No Has patient had Cameron PCN reaction occurring within the last 10 years:Yes If all of the above answers are "NO", then may proceed with Cephalosporin use. "severe anxiety-ativan does not remedy"  . Doxycycline Anxiety  . Flagyl [Metronidazole] Anxiety  . Singulair [Montelukast Sodium] Anxiety   Past Medical History:  Diagnosis Date  . Anxiety   . Arthritis   . Bladder stone   . Depression   . Diverticulosis of colon   . GERD (gastroesophageal reflux disease)   . H/O hiatal hernia   . Helicobacter pylori gastritis   . History of colonoscopy with polypectomy    TUBULAR ADENOMA-- 2012  . History of esophageal dilatation    STRICTURE--  LAST DONE 03/2013  . Hypertension    for 5 yrs  . Left ureteral calculus   . Osteoporosis   . Wears glasses     Current Outpatient Medications:  .  calcium citrate-vitamin D (CITRACAL+D) 315-200 MG-UNIT per tablet, Take 1 tablet  by mouth daily. , Disp: , Rfl:  .  Cholecalciferol (VITAMIN D3) 3000 units TABS, Take 3,000 Units by mouth daily., Disp: , Rfl:  .  cimetidine (TAGAMET) 400 MG tablet, Take 1 tablet (400 mg total) by mouth daily as needed. (Patient taking differently: Take 200 mg by mouth at bedtime as needed (for heartburn/reflux.). ), Disp: 30 tablet, Rfl: 5 .  doxazosin (CARDURA) 4 MG tablet, , Disp: , Rfl:  .  fish oil-omega-3 fatty acids 1000 MG capsule, Take 2 g by mouth daily after breakfast. , Disp: , Rfl:  .  gabapentin (NEURONTIN) 600 MG tablet, TAKE 1 TABLET BY  MOUTH THREE TIMES DAILY, Disp: 90 tablet, Rfl: 0 .  hydrOXYzine (VISTARIL) 25 MG capsule, Take 1 capsule (25 mg total) by mouth every 8 (eight) hours as needed., Disp: 90 capsule, Rfl: 3 .  lisinopril (PRINIVIL,ZESTRIL) 20 MG tablet, Take 1 tablet (20 mg total) by mouth daily., Disp: 90 tablet, Rfl: 1 .  LORazepam (ATIVAN) 1 MG tablet, Take 1 tablet (1 mg total) by mouth 4 (four) times daily as needed., Disp: 120 tablet, Rfl: 2 .  Multiple Vitamin (MULTIVITAMIN) tablet, Take 1 tablet by mouth daily., Disp: , Rfl:  .  naproxen (NAPROSYN) 500 MG tablet, Take 1 tablet (500 mg total) by mouth 2 (two) times daily., Disp: 30 tablet, Rfl: 0 .  pantoprazole (PROTONIX) 40 MG tablet, TAKE ONE TABLET BY MOUTH EVERY MORNING, Disp: 30 tablet, Rfl: 11 .  rOPINIRole (REQUIP) 0.25 MG tablet, TAKE 1 TABLET BY MOUTH BEFORE bed FOR ONE WEEK, THEN TAKE TWO TABLETS DAILY, Disp: 60 tablet, Rfl: 3 Social History   Socioeconomic History  . Marital status: Married    Spouse name: Not on file  . Number of children: Not on file  . Years of education: Not on file  . Highest education level: Not on file  Occupational History  . Not on file  Social Needs  . Financial resource strain: Not on file  . Food insecurity:    Worry: Not on file    Inability: Not on file  . Transportation needs:    Medical: Not on file    Non-medical: Not on file  Tobacco Use  . Smoking status: Former Smoker    Packs/day: 0.25    Years: 2.00    Pack years: 0.50    Types: Cigarettes    Last attempt to quit: 01/14/1969    Years since quitting: 49.8  . Smokeless tobacco: Former Systems developer    Types: [place] date: 01/15/1999  Substance and Sexual Activity  . Alcohol use: Yes    Alcohol/week: 21.0 standard drinks    Types: 21 Cans of beer per week    Comment: 1-2 beer daily  . Drug use: No  . Sexual activity: Never    Birth control/protection: None  Lifestyle  . Physical activity:    Days per week: Not on file    Minutes per  session: Not on file  . Stress: Not on file  Relationships  . Social connections:    Talks on phone: Not on file    Gets together: Not on file    Attends religious service: Not on file    Active member of club or organization: Not on file    Attends meetings of clubs or organizations: Not on file    Relationship status: Not on file  . Intimate partner violence:    Fear of current or ex partner: Not on file  Emotionally abused: Not on file    Physically abused: Not on file    Forced sexual activity: Not on file  Other Topics Concern  . Not on file  Social History Narrative  . Not on file   No family history on file.  Objective: Office vital signs reviewed. BP 138/75   Pulse 95   Temp 98.2 F (36.8 C) (Oral)   Ht 5\' 6"  (1.676 m)   Wt 165 lb (74.8 kg)   BMI 26.63 kg/m   Physical Examination:  General: Awake, alert, well appearing elderly man, No acute distress HEENT: Normal, sclera white, MMM Cardio: regular rate and rhythm, S1S2 heard, no murmurs appreciated Pulm: clear to auscultation bilaterally, no wheezes, rhonchi or rales; normal work of breathing on room air Extremities: warm, well perfused, No edema, cyanosis or clubbing; +2 pulses bilaterally Psych: Speech normal.  Mood stable.  Continues to have some tangential thought but improved from previous visit.  Pleasant and interactive.  Depression screen [facility] 2/9 11/20/2018 10/18/2018 09/04/2018  Decreased Interest 1 2 1   Down, Depressed, Hopeless 0 1 1  PHQ - 2 Score 1 3 2   Altered sleeping 1 3 1   Tired, decreased energy 1 1 1   Change in appetite 1 3 2   Feeling bad or failure about yourself  0 0 0  Trouble concentrating 1 1 1   Moving slowly or fidgety/restless 0 1 0  Suicidal thoughts 0 0 0  PHQ-9 Score 5 12 7   Difficult doing work/chores - - Somewhat difficult  Some recent data might be hidden   GAD 7 : Generalized Anxiety Score 11/20/2018 09/04/2018 07/10/2018  Nervous, Anxious, on Edge 1 1 1   Control/stop  worrying 1 1 1   Worry too much - different things 0 1 1  Trouble relaxing 1 1 1   Restless 0 0 0  Easily annoyed or irritable 1 0 0  Afraid - awful might happen 0 1 1  Total GAD 7 Score 4 5 5   Anxiety Difficulty Somewhat difficult Somewhat difficult Somewhat difficult    Assessment/ Plan: 73 y.o. male   1. Essential hypertension Under much better control compared to previous visit.  Check BMP given hyponatremia noted at his last visit with PCP.  Recheck BMP for creatinine as well.  This was noted to be elevated on the last 2 checks but had been normal in 2018.  Unsure if he has Cameron baseline CKD versus this is been an AKI that was captured. - Basic Metabolic Panel - CBC  2. Anxiety We discussed consideration for Remeron, particularly since his symptoms seem to be predominantly at the night time hours.  Okay to continue Ativan at current dose for now but we did discuss the risk of continued Ativan use.  I do certainly think he has Cameron dependence on this given how long he has been on this medication.  We discussed that if he does find less of Cameron need for the medication he is not to completely discontinue the medicine abruptly and that this should be Cameron tapered medication.  He voiced good understanding will follow-up with me in the next 4 to 6 weeks. The Narcotic Database has been reviewed.  There were no red flags.  Last fill 2/17.  New rx to be placed on file.   - TSH - CBC - mirtazapine (REMERON) 15 MG tablet; Take 1 tablet (15 mg total) by mouth at bedtime.  Dispense: 30 tablet; Refill: 1 - LORazepam (ATIVAN) 1 MG tablet; Take 1 tablet (  1 mg total) by mouth every 6 (six) hours as needed for anxiety.  Dispense: 120 tablet; Refill: 0  3. Benzodiazepine dependence ([place]) As above.  Plan for slow taper if able.  Will be seeing psychiatry soon.  - LORazepam (ATIVAN) 1 MG tablet; Take 1 tablet (1 mg total) by mouth every 6 (six) hours as needed for anxiety.  Dispense: 120 tablet; Refill: 0  4. High  risk medication use - Basic Metabolic Panel - TSH - CBC - LORazepam (ATIVAN) 1 MG tablet; Take 1 tablet (1 mg total) by mouth every 6 (six) hours as needed for anxiety.  Dispense: 120 tablet; Refill: 0   Orders Placed This Encounter  Procedures  . Basic Metabolic Panel  . TSH  . CBC   Meds ordered this encounter  Medications  . mirtazapine (REMERON) 15 MG tablet    Sig: Take 1 tablet (15 mg total) by mouth at bedtime.    Dispense:  30 tablet    Refill:  1  . LORazepam (ATIVAN) 1 MG tablet    Sig: Take 1 tablet (1 mg total) by mouth every 6 (six) hours as needed for anxiety.    Dispense:  120 tablet    Refill:  0    Place on hold. Just filled 11/13/2018    Janora Norlander, DO [place] [phone]

## 2018-11-21 ENCOUNTER — Other Ambulatory Visit: Payer: Self-pay | Admitting: Family Medicine

## 2018-11-21 ENCOUNTER — Other Ambulatory Visit: Payer: Self-pay | Admitting: *Deleted

## 2018-11-21 ENCOUNTER — Ambulatory Visit: Payer: Medicare Other | Admitting: Licensed Clinical Social Worker

## 2018-11-21 DIAGNOSIS — I10 Essential (primary) hypertension: Secondary | ICD-10-CM

## 2018-11-21 DIAGNOSIS — D369 Benign neoplasm, unspecified site: Secondary | ICD-10-CM

## 2018-11-21 DIAGNOSIS — F339 Major depressive disorder, recurrent, unspecified: Secondary | ICD-10-CM

## 2018-11-21 DIAGNOSIS — F1320 Sedative, hypnotic or anxiolytic dependence, uncomplicated: Secondary | ICD-10-CM

## 2018-11-21 DIAGNOSIS — F419 Anxiety disorder, unspecified: Secondary | ICD-10-CM

## 2018-11-21 DIAGNOSIS — D649 Anemia, unspecified: Secondary | ICD-10-CM

## 2018-11-21 LAB — CBC
HEMOGLOBIN: 11.6 g/dL — AB (ref 13.0–17.7)
Hematocrit: 34.1 % — ABNORMAL LOW (ref 37.5–51.0)
MCH: 29.4 pg (ref 26.6–33.0)
MCHC: 34 g/dL (ref 31.5–35.7)
MCV: 86 fL (ref 79–97)
Platelets: 300 10*3/uL (ref 150–450)
RBC: 3.95 x10E6/uL — ABNORMAL LOW (ref 4.14–5.80)
RDW: 12.4 % (ref 11.6–15.4)
WBC: 6.9 10*3/uL (ref 3.4–10.8)

## 2018-11-21 LAB — BASIC METABOLIC PANEL
BUN/Creatinine Ratio: 13 (ref 10–24)
BUN: 14 mg/dL (ref 8–27)
CALCIUM: 9.2 mg/dL (ref 8.6–10.2)
CO2: 20 mmol/L (ref 20–29)
Chloride: 103 mmol/L (ref 96–106)
Creatinine, Ser: 1.11 mg/dL (ref 0.76–1.27)
GFR calc Af Amer: 76 mL/min/{1.73_m2} (ref >59)
GFR calc non Af Amer: 66 mL/min/{1.73_m2} (ref >59)
Glucose: 96 mg/dL (ref 65–99)
Potassium: 4.6 mmol/L (ref 3.5–5.2)
Sodium: 136 mmol/L (ref 134–144)

## 2018-11-21 LAB — TSH: TSH: 1.51 u[IU]/mL (ref 0.450–4.500)

## 2018-11-21 NOTE — Chronic Care Management (AMB) (Signed)
  Chronic Care Management    Clinical Social Work General Follow Up Note  11/21/2018 Name: Cameron Atkins MRN: [id number] DOB: 11/05/1945  Referred by: PCP, Madaline Guthrie for psychosocial assessment and possible counseling support  Review of patient status, including review of consultants reports, relevant laboratory and other test results, and collaboration with appropriate care team members and the patient's provider was performed as part of comprehensive patient evaluation and provision of chronic care management services.    Last CCM Appointment: 11/21/2018  Depression screen PHQ 2/9 11/20/2018  Decreased Interest 1  Down, Depressed, Hopeless 0  PHQ - 2 Score 1  Altered sleeping 1  Tired, decreased energy 1  Change in appetite 1  Feeling bad or failure about yourself  0  Trouble concentrating 1  Moving slowly or fidgety/restless 0  Suicidal thoughts 0  PHQ-9 Score 5  Difficult doing work/chores -  Some recent data might be hidden     GAD 7 : Generalized Anxiety Score 11/20/2018 09/04/2018 07/10/2018  Nervous, Anxious, on Edge 1 1 1   Control/stop worrying 1 1 1   Worry too much - different things 0 1 1  Trouble relaxing 1 1 1   Restless 0 0 0  Easily annoyed or irritable 1 0 0  Afraid - awful might happen 0 1 1  Total GAD 7 Score 4 5 5   Anxiety Difficulty Somewhat difficult Somewhat difficult Somewhat difficult      Goals             . "I need help in managing anxiety" (pt-stated)      Current Barriers:   Mental Health Concerns (Anxiety Disorder)  Clinical Social Work Clinical Goal(s): Over the next 30 days, client will work with LCSW to address concerns related to management of anxiety symptoms of client  Interventions:  Client and LCSW spoke via phone about psychosocial needs of client.   LCSW talked with client about management of anxiety symptoms of client and approaches for managing anxiety symptoms.   LCSW talked with client about client social  support system  LCSW provided counseling support for client  Spoke with client about client's scheduled appointment with psychiatrist  Spoke with client about client's scheduled appointment with gastroenterologist  Patient Self Care Activities:    Self administers medications as prescribed.    Attends all scheduled provider appointments.  Talks openly about anxiety symptoms management  Attends [employer] to exercise 2 times weekly  Plan:   LCSW to call client in 3 weeks to further discuss management of anxiety symptoms of client.  Client to call LCSW as needed to discuss psychosocial needs of client.  Client to contact PCP as needed to discuss medical needs of client.   Client to continue to talk with LCSW as needed about approaches for managing anxiety symptoms of client  *initial goal documentation       Follow Up Plan:  LCSW to call client in 3 weeks to discuss management by client of client's anxiety symptoms faced   Norva Riffle.Warwick Nick MSW, LCSW Licensed Clinical Social Worker [facility] [phone]

## 2018-11-21 NOTE — Patient Instructions (Addendum)
Licensed Clinical Social Worker Visit Information  Materials Provided:  No           Goals            . "I need help in managing anxiety" (pt-stated)      Current Barriers:  Mental Health Concerns (Anxiety Disorder)  Clinical Social Work Clinical Goal(s):Over the next 30 days, client will work with LCSW to address concerns related to management of anxiety symptoms of client  Interventions:  Client and LCSW spoke via phone about psychosocial needs of client.   LCSW talked with client about management of anxiety symptoms of client and approaches for managing anxiety symptoms.  LCSW talked with client about client social support system  LCSW provided counseling support for client  Spoke with client about client's scheduled appointment with psychiatrist  Spoke with client about client's scheduled appointment with gastroenterologist  Patient Self Care Activities:   Self administers medications as prescribed.  Attends all scheduled provider appointments.  Talks openly about anxiety symptoms management  Attends [employer] to exercise 2 times weekly  Plan:   LCSW to call client in 3 weeks to further discuss management of anxiety symptoms of client.  Client to call LCSW as needed to discuss psychosocial needs of client.  Client to contact PCP as needed to discuss medical needs of client.  Client to continue to talk with LCSW as needed about approaches for managing anxiety symptoms of client  *initial goal documentation       Follow Up Plan: LCSW to call client in 3 weeks to discuss management by client of client's anxiety symptoms faced   The patient verbalized understanding of instructions provided today and declined a print copy of patient instruction materials.   Norva Riffle.Lucille Witts MSW, LCSW Licensed Clinical Social Worker [facility] [phone]

## 2018-11-22 ENCOUNTER — Encounter (INDEPENDENT_AMBULATORY_CARE_PROVIDER_SITE_OTHER): Payer: Self-pay | Admitting: Internal Medicine

## 2018-11-22 ENCOUNTER — Ambulatory Visit (INDEPENDENT_AMBULATORY_CARE_PROVIDER_SITE_OTHER): Payer: Medicare Other | Admitting: Internal Medicine

## 2018-11-22 VITALS — BP 148/85 | HR 76 | Temp 98.0°F | Resp 18 | Ht 66.0 in | Wt 164.1 lb

## 2018-11-22 DIAGNOSIS — D509 Iron deficiency anemia, unspecified: Secondary | ICD-10-CM

## 2018-11-22 DIAGNOSIS — D508 Other iron deficiency anemias: Secondary | ICD-10-CM | POA: Diagnosis not present

## 2018-11-22 HISTORY — DX: Iron deficiency anemia, unspecified: D50.9

## 2018-11-22 NOTE — Patient Instructions (Signed)
Am going to repeat H and H and ferritin 3 stools cards home with patient.

## 2018-11-22 NOTE — Progress Notes (Signed)
Subjective:    Patient ID: Cameron Atkins, male    DOB: 1946-03-28, 73 y.o.   MRN: [id number]  HPI Referred by [facility] for anemia. H and H 11/20/2018 11.6 and 34.1.  07/31/2018 H and H  12.5 and 34.4.  07/31/2018 Ferritin 36 He is doing good. He denies seeing any blood any blood in her stools. His appetite is good. No weight loss. Last colonoscopy was in 2017 (personal hx of colon polyps) which revealed diverticulosis in the sigmoid colon. One small polyp in the rectum, internal hemorrhoids. Biopsy: Hyperplastic.    CBC    Component Value Date/Time   WBC 6.9 11/20/2018 1414   WBC 6.3 09/06/2016 1304   RBC 3.95 (L) 11/20/2018 1414   RBC 4.14 (L) 09/06/2016 1304   HGB 11.6 (L) 11/20/2018 1414   HCT 34.1 (L) 11/20/2018 1414   PLT 300 11/20/2018 1414   MCV 86 11/20/2018 1414   MCH 29.4 11/20/2018 1414   MCH 30.7 09/06/2016 1304   MCHC 34.0 11/20/2018 1414   MCHC 34.0 09/06/2016 1304   RDW 12.4 11/20/2018 1414   LYMPHSABS 2.7 07/31/2018 1510   MONOABS 0.7 09/06/2016 1304   EOSABS 0.4 07/31/2018 1510   BASOSABS 0.0 07/31/2018 1510       Review of Systems     Past Medical History:  Diagnosis Date  . Anxiety   . Arthritis   . Bladder stone   . Depression   . Diverticulosis of colon   . GERD (gastroesophageal reflux disease)   . H/O hiatal hernia   . Helicobacter pylori gastritis   . History of colonoscopy with polypectomy    TUBULAR ADENOMA-- 2012  . History of esophageal dilatation    STRICTURE--  LAST DONE 03/2013  . Hypertension    for 5 yrs  . Left ureteral calculus   . Osteoporosis   . Wears glasses     Past Surgical History:  Procedure Laterality Date  . BIOPSY  09/10/2016   Procedure: BIOPSY;  Surgeon: Rogene Houston, MD;  [facility];  Service: Endoscopy;;  rectal polyp  . COLONOSCOPY WITH PROPOFOL N/A 09/10/2016   Procedure: COLONOSCOPY WITH PROPOFOL;  Surgeon: Rogene Houston, MD;  [facility];  Service: Endoscopy;   Laterality: N/A;  . CYSTO/  URETEROSCOPIC LITHOTRIPSY/ STONE EXTRACTION  1995  . CYSTOSCOPY W/ RETROGRADES Right 01/15/2014   Procedure: CYSTOSCOPY WITH RIGHT RETROGRADE PYELOGRAM;  Surgeon: Irine Seal, MD;  Location: [facility];  Service: Urology;  Laterality: Right;  . CYSTOSCOPY WITH RETROGRADE PYELOGRAM, URETEROSCOPY AND STENT PLACEMENT Left 01/15/2014   Procedure: CYSTOSCOPY , LEFT RETROGRADE PYELOGRAM, LEFT URETEROSCOPY, BASKET STONE EXTRACTION;  Surgeon: Irine Seal, MD;  Location: [facility];  Service: Urology;  Laterality: Left;  . ESOPHAGEAL DILATION  03/21/2013   Procedure: ESOPHAGEAL DILATION;  Surgeon: Danie Binder, MD;  [facility];  Service: Endoscopy;;  . ESOPHAGEAL DILATION N/A 09/10/2016   Procedure: ESOPHAGEAL DILATION;  Surgeon: Rogene Houston, MD;  [facility];  Service: Endoscopy;  Laterality: N/A;  . ESOPHAGOGASTRODUODENOSCOPY N/A 03/21/2013   Procedure: ESOPHAGOGASTRODUODENOSCOPY (EGD);  Surgeon: Danie Binder, MD;  [facility];  Service: Endoscopy;  Laterality: N/A;  . ESOPHAGOGASTRODUODENOSCOPY (EGD) WITH ESOPHAGEAL DILATION N/A 04/26/2013   Procedure: ESOPHAGOGASTRODUODENOSCOPY (EGD) WITH ESOPHAGEAL DILATION;  Surgeon: Rogene Houston, MD;  [facility];  Service: Endoscopy;  Laterality: N/A;  130  . ESOPHAGOGASTRODUODENOSCOPY (EGD) WITH PROPOFOL N/A  09/10/2016   Procedure: ESOPHAGOGASTRODUODENOSCOPY (EGD) WITH PROPOFOL;  Surgeon: Rogene Houston, MD;  [facility];  Service: Endoscopy;  Laterality: N/A;  10:20  . EXTRACORPOREAL SHOCK WAVE LITHOTRIPSY  X2   YRS AGO  . HOLMIUM LASER APPLICATION Left 0/35/4656   Procedure: HOLMIUM LASER APPLICATION, LEFT URETER;  Surgeon: Irine Seal, MD;  Location: [facility];  Service: Urology;  Laterality: Left;  . SHOULDER ARTHROSCOPY W/ SUBACROMIAL DECOMPRESSION AND DISTAL CLAVICLE EXCISION Left 2000    Allergies  Allergen  Reactions  . Benadryl [Diphenhydramine] Other (See Comments)    anxious  . Hctz [Hydrochlorothiazide]     hyponatremia  . Mepergan [Meperidine-Promethazine]     Vomiting.   . Other     Antidepressants--per patient he cannot take.  Marland Kitchen Oxycodone Itching    hyperactivity  . Amoxicillin     Has patient had a PCN reaction causing immediate rash, facial/tongue/throat swelling, SOB or lightheadedness with hypotension:No Has patient had a PCN reaction causing severe rash involving mucus membranes or skin necrosis:No Has patient had a PCN reaction that required hospitalization:No Has patient had a PCN reaction occurring within the last 10 years:Yes If all of the above answers are "NO", then may proceed with Cephalosporin use. "severe anxiety-ativan does not remedy"  . Doxycycline Anxiety  . Flagyl [Metronidazole] Anxiety  . Singulair [Montelukast Sodium] Anxiety    Current Outpatient Medications on File Prior to Visit  Medication Sig Dispense Refill  . calcium citrate-vitamin D (CITRACAL+D) 315-200 MG-UNIT per tablet Take 1 tablet by mouth daily.     . Cholecalciferol (VITAMIN D3) 3000 units TABS Take 3,000 Units by mouth daily.    . cimetidine (TAGAMET) 400 MG tablet Take 1 tablet (400 mg total) by mouth daily as needed. (Patient taking differently: Take 200 mg by mouth at bedtime as needed (for heartburn/reflux.). ) 30 tablet 5  . fish oil-omega-3 fatty acids 1000 MG capsule Take 2 g by mouth daily after breakfast.     . lisinopril (PRINIVIL,ZESTRIL) 20 MG tablet Take 1 tablet (20 mg total) by mouth daily. 90 tablet 1  . [START ON 12/13/2018] LORazepam (ATIVAN) 1 MG tablet Take 1 tablet (1 mg total) by mouth every 6 (six) hours as needed for anxiety. 120 tablet 0  . Multiple Vitamin (MULTIVITAMIN) tablet Take 1 tablet by mouth daily.    . pantoprazole (PROTONIX) 40 MG tablet TAKE ONE TABLET BY MOUTH EVERY MORNING 30 tablet 11  . rOPINIRole (REQUIP) 0.25 MG tablet TAKE 1 TABLET BY MOUTH BEFORE  bed FOR ONE WEEK, THEN TAKE TWO TABLETS DAILY 60 tablet 3  . doxazosin (CARDURA) 4 MG tablet Take 4 mg by mouth.     . gabapentin (NEURONTIN) 600 MG tablet TAKE 1 TABLET BY MOUTH THREE TIMES DAILY (Patient not taking: Reported on 11/22/2018) 90 tablet 0  . hydrOXYzine (VISTARIL) 25 MG capsule Take 1 capsule (25 mg total) by mouth every 8 (eight) hours as needed. (Patient not taking: Reported on 11/22/2018) 90 capsule 3   No current facility-administered medications on file prior to visit.      Objective:   Physical Exam Blood pressure (!) 148/85, pulse 76, temperature 98 F (36.7 C), temperature source Oral, resp. rate 18, height 5\' 6"  (1.676 m), weight 164 lb 1.6 oz (74.4 kg). Alert and oriented. Skin warm and dry. Oral mucosa is moist.   . Sclera anicteric, conjunctivae is pink. Thyroid not enlarged. No cervical lymphadenopathy. Lungs clear. Heart regular rate and rhythm.  Abdomen  is soft. Bowel sounds are positive. No hepatomegaly. No abdominal masses felt. No tenderness.  No edema to lower extremities.           Assessment & Plan:  IDA. Am going to get an H and H and ferritin. Am going to send 3 stools cards home with patient.  Further recommendations to follow.

## 2018-11-23 LAB — HEMOGLOBIN AND HEMATOCRIT, BLOOD
HCT: 34.1 % — ABNORMAL LOW (ref 38.5–50.0)
Hemoglobin: 11.7 g/dL — ABNORMAL LOW (ref 13.2–17.1)

## 2018-11-23 LAB — FERRITIN: Ferritin: 16 ng/mL — ABNORMAL LOW (ref 24–380)

## 2018-11-29 ENCOUNTER — Other Ambulatory Visit (INDEPENDENT_AMBULATORY_CARE_PROVIDER_SITE_OTHER): Payer: Self-pay | Admitting: *Deleted

## 2018-11-29 ENCOUNTER — Ambulatory Visit (INDEPENDENT_AMBULATORY_CARE_PROVIDER_SITE_OTHER): Payer: Self-pay | Admitting: Internal Medicine

## 2018-11-29 ENCOUNTER — Telehealth (INDEPENDENT_AMBULATORY_CARE_PROVIDER_SITE_OTHER): Payer: Self-pay | Admitting: *Deleted

## 2018-11-29 ENCOUNTER — Encounter (INDEPENDENT_AMBULATORY_CARE_PROVIDER_SITE_OTHER): Payer: Self-pay | Admitting: *Deleted

## 2018-11-29 DIAGNOSIS — D508 Other iron deficiency anemias: Secondary | ICD-10-CM

## 2018-11-29 NOTE — Telephone Encounter (Signed)
Results given to patient.  Ferritin in 2 weeks

## 2018-11-29 NOTE — Telephone Encounter (Signed)
   Diagnosis:    Result(s)   Card 1: Negative: 11/22/18    Card 2:Negative: 11/23/18   Card 3:Negative: 11/24/18    Completed by: Thomas Hoff , LPN   HEMOCCULT SENSA DEVELOPER: LOT#: [id number]  EXPIRATION DATE: 2021-11  HEMOCCULT SENSA CARD:  LOT#:  [id number] EXPIRATION DATE: 05/18/19   CARD CONTROL RESULTS:  POSITIVE: Positive NEGATIVE: Negative    ADDITIONAL COMMENTS: Patient has not been contacted with results. Forwarded to the ordering provider.

## 2018-12-03 ENCOUNTER — Other Ambulatory Visit (INDEPENDENT_AMBULATORY_CARE_PROVIDER_SITE_OTHER): Payer: Self-pay | Admitting: Internal Medicine

## 2018-12-08 ENCOUNTER — Ambulatory Visit (INDEPENDENT_AMBULATORY_CARE_PROVIDER_SITE_OTHER): Payer: Medicare Other | Admitting: Licensed Clinical Social Worker

## 2018-12-08 DIAGNOSIS — F1320 Sedative, hypnotic or anxiolytic dependence, uncomplicated: Secondary | ICD-10-CM

## 2018-12-08 DIAGNOSIS — I10 Essential (primary) hypertension: Secondary | ICD-10-CM | POA: Diagnosis not present

## 2018-12-08 DIAGNOSIS — F419 Anxiety disorder, unspecified: Secondary | ICD-10-CM

## 2018-12-08 DIAGNOSIS — F339 Major depressive disorder, recurrent, unspecified: Secondary | ICD-10-CM

## 2018-12-08 NOTE — Patient Instructions (Signed)
Licensed Clinical Social Worker Visit Information  Goals we discussed today:  Goals Addressed            This Visit's Progress   . "I need help in managing anxiety" (pt-stated)       Current Barriers:  Marland Kitchen Mental Health Concerns (Anxiety Disorder)  Clinical Social Work Clinical Goal(s): Over the next 30 days, client will work with LCSW to address concerns related to management of anxiety symptoms of client  . Interventions: . LCSW discussed CCM program services with client.  Marland Kitchen LCSW talked with client about management of anxiety symptoms of client and approaches for managing anxiety symptoms.  Marland Kitchen LCSW collaborated with RN CM regarding nursing needs of client . LCSW talked with client about social work needs of client  . Discussed psychiatric appointment scheduled for client with Dr. Gerri Lins, [place]  Patient Self Care Activities:  . Self administers medications as prescribed. . Attends all scheduled provider appointments . Talks openly about anxiety symptoms management  Plan:  . LCSW to call client in 3 weeks to further discuss management of anxiety symptoms of client. . Client to call LCSW as needed to discuss psychosocial needs of client.  .  Client to contact PCP as needed to discuss medical needs of client.   .   Client to use relaxation techniques of choice to help manage anxiety symptoms   .  Client to attend mental health appointment as scheduled with Dr. Harrington Challenger, Linna Hoff, [place] *initial goal documentation      Materials Provided: No  Follow Up Plan: LCSW to call client in 3 weeks to discuss management of anxiety symptoms of client  The patient verbalized understanding of instructions provided today and declined a print copy of patient instruction materials.   Norva Riffle.Arjan Strohm MSW, LCSW Licensed Clinical Social Worker [facility] [phone]

## 2018-12-08 NOTE — Chronic Care Management (AMB) (Signed)
  Care Management Note   Cameron Atkins is a 73 y.o. year old male who is a primary care patient of Janora Norlander, DO. The CM team was consult for assistance with chronic disease management and care coordination.   I reached out to [name] by phone today.   Review of patient status, including review of consultants reports, relevant laboratory and other test results, and collaboration with appropriate care team members and the patient's provider was performed as part of comprehensive patient evaluation and provision of chronic care management services.  Goals Addressed            This Visit's Progress   . "I need help in managing anxiety" (pt-stated)       Current Barriers:  Marland Kitchen Mental Health Concerns (Anxiety Disorder)  Clinical Social Work Clinical Goal(s): Over the next 30 days, client will work with LCSW to address concerns related to management of anxiety symptoms of client  . Interventions: . LCSW discussed CCM program services with client.  Marland Kitchen LCSW talked with client about management of anxiety symptoms of client and approaches for managing anxiety symptoms.  Marland Kitchen LCSW collaborated with RN CM regarding nursing needs of client . LCSW talked with client about social work needs of client . Discussed psychiatric support appointment for client with Dr. Harrington Challenger, in [facility]  Patient Self Care Activities:  . Self administers medications as prescribed. . Attends all scheduled provider appointments . Talks openly about anxiety symptoms management  Plan:  . LCSW to call client in 3 weeks to further discuss management of anxiety symptoms of client. . Client to call LCSW as needed to discuss psychosocial needs of client.  .  Client to contact PCP as needed to discuss medical needs of client.   . Client to use relaxation techniques of choice to help manage anxiety symptoms . Client to attend psychiatric appointment as scheduled with Dr. Harrington Challenger in [place], [place]  *initial goal  documentation     Follow Up Plan: LCSW to call client in 3 weeks to discuss management of anxiety symptoms of client.  Norva Riffle.Fallynn Gravett MSW, LCSW Licensed Clinical Social Worker [facility] [phone]

## 2018-12-13 ENCOUNTER — Other Ambulatory Visit (INDEPENDENT_AMBULATORY_CARE_PROVIDER_SITE_OTHER): Payer: Self-pay | Admitting: *Deleted

## 2018-12-13 DIAGNOSIS — D649 Anemia, unspecified: Secondary | ICD-10-CM

## 2018-12-13 DIAGNOSIS — D508 Other iron deficiency anemias: Secondary | ICD-10-CM | POA: Diagnosis not present

## 2018-12-13 DIAGNOSIS — K219 Gastro-esophageal reflux disease without esophagitis: Secondary | ICD-10-CM

## 2018-12-13 DIAGNOSIS — K222 Esophageal obstruction: Secondary | ICD-10-CM

## 2018-12-13 LAB — FERRITIN: Ferritin: 13 ng/mL — ABNORMAL LOW (ref 24–380)

## 2018-12-13 LAB — HEMOGLOBIN AND HEMATOCRIT, BLOOD
HCT: 35.6 % — ABNORMAL LOW (ref 38.5–50.0)
Hemoglobin: 12.2 g/dL — ABNORMAL LOW (ref 13.2–17.1)

## 2018-12-13 NOTE — Progress Notes (Deleted)
Psychiatric Initial Adult Assessment   Patient Identification: Cameron Atkins MRN:  [id number] Date of Evaluation:  12/13/2018 Referral Source: *** Chief Complaint:   Visit Diagnosis: No diagnosis found.  History of Present Illness:   Cameron Atkins is a 73 y.o. year old male with a history of depression, anxiety, hypertension, GERD , who is referred for    Associated Signs/Symptoms: Depression Symptoms:  {DEPRESSION SYMPTOMS:20000} (Hypo) Manic Symptoms:  {BHH MANIC SYMPTOMS:22872} Anxiety Symptoms:  {BHH ANXIETY SYMPTOMS:22873} Psychotic Symptoms:  {BHH PSYCHOTIC SYMPTOMS:22874} PTSD Symptoms: {BHH PTSD SYMPTOMS:22875}  Past Psychiatric History:  Outpatient:  Psychiatry admission:  Previous suicide attempt:  Past trials of medication:  History of violence:   Previous Psychotropic Medications: {YES/NO:21197}  Substance Abuse History in the last 12 months:  {yes no:314532}  Consequences of Substance Abuse: {BHH CONSEQUENCES OF SUBSTANCE ABUSE:22880}  Past Medical History:  Past Medical History:  Diagnosis Date  . Anxiety   . Arthritis   . Bladder stone   . Depression   . Diverticulosis of colon   . GERD (gastroesophageal reflux disease)   . H/O hiatal hernia   . Helicobacter pylori gastritis   . History of colonoscopy with polypectomy    TUBULAR ADENOMA-- 2012  . History of esophageal dilatation    STRICTURE--  LAST DONE 03/2013  . Hypertension    for 5 yrs  . IDA (iron deficiency anemia) 11/22/2018  . Left ureteral calculus   . Osteoporosis   . Wears glasses     Past Surgical History:  Procedure Laterality Date  . BIOPSY  09/10/2016   Procedure: BIOPSY;  Surgeon: Rogene Houston, MD;  [facility];  Service: Endoscopy;;  rectal polyp  . COLONOSCOPY WITH PROPOFOL N/A 09/10/2016   Procedure: COLONOSCOPY WITH PROPOFOL;  Surgeon: Rogene Houston, MD;  [facility];  Service: Endoscopy;  Laterality: N/A;  . CYSTO/  URETEROSCOPIC  LITHOTRIPSY/ STONE EXTRACTION  1995  . CYSTOSCOPY W/ RETROGRADES Right 01/15/2014   Procedure: CYSTOSCOPY WITH RIGHT RETROGRADE PYELOGRAM;  Surgeon: Irine Seal, MD;  Location: [facility];  Service: Urology;  Laterality: Right;  . CYSTOSCOPY WITH RETROGRADE PYELOGRAM, URETEROSCOPY AND STENT PLACEMENT Left 01/15/2014   Procedure: CYSTOSCOPY , LEFT RETROGRADE PYELOGRAM, LEFT URETEROSCOPY, BASKET STONE EXTRACTION;  Surgeon: Irine Seal, MD;  Location: [facility];  Service: Urology;  Laterality: Left;  . ESOPHAGEAL DILATION  03/21/2013   Procedure: ESOPHAGEAL DILATION;  Surgeon: Danie Binder, MD;  [facility];  Service: Endoscopy;;  . ESOPHAGEAL DILATION N/A 09/10/2016   Procedure: ESOPHAGEAL DILATION;  Surgeon: Rogene Houston, MD;  [facility];  Service: Endoscopy;  Laterality: N/A;  . ESOPHAGOGASTRODUODENOSCOPY N/A 03/21/2013   Procedure: ESOPHAGOGASTRODUODENOSCOPY (EGD);  Surgeon: Danie Binder, MD;  [facility];  Service: Endoscopy;  Laterality: N/A;  . ESOPHAGOGASTRODUODENOSCOPY (EGD) WITH ESOPHAGEAL DILATION N/A 04/26/2013   Procedure: ESOPHAGOGASTRODUODENOSCOPY (EGD) WITH ESOPHAGEAL DILATION;  Surgeon: Rogene Houston, MD;  [facility];  Service: Endoscopy;  Laterality: N/A;  130  . ESOPHAGOGASTRODUODENOSCOPY (EGD) WITH PROPOFOL N/A 09/10/2016   Procedure: ESOPHAGOGASTRODUODENOSCOPY (EGD) WITH PROPOFOL;  Surgeon: Rogene Houston, MD;  [facility];  Service: Endoscopy;  Laterality: N/A;  10:20  . EXTRACORPOREAL SHOCK WAVE LITHOTRIPSY  X2   YRS AGO  . HOLMIUM LASER APPLICATION Left 5/95/6387   Procedure: HOLMIUM LASER APPLICATION, LEFT URETER;  Surgeon: Irine Seal, MD;  Location: [facility];  Service: Urology;  Laterality:  Left;  . SHOULDER ARTHROSCOPY W/ SUBACROMIAL DECOMPRESSION AND DISTAL CLAVICLE EXCISION Left 2000    Family Psychiatric History: ***  Family History: No family history  on file.  Social History:   Social History   Socioeconomic History  . Marital status: Married    Spouse name: Not on file  . Number of children: Not on file  . Years of education: Not on file  . Highest education level: Not on file  Occupational History  . Not on file  Social Needs  . Financial resource strain: Not on file  . Food insecurity:    Worry: Not on file    Inability: Not on file  . Transportation needs:    Medical: Not on file    Non-medical: Not on file  Tobacco Use  . Smoking status: Former Smoker    Packs/day: 0.25    Years: 2.00    Pack years: 0.50    Types: Cigarettes    Last attempt to quit: 01/14/1969    Years since quitting: 49.9  . Smokeless tobacco: Former Systems developer    Types: [place] date: 01/15/1999  Substance and Sexual Activity  . Alcohol use: Yes    Alcohol/week: 21.0 standard drinks    Types: 21 Cans of beer per week    Comment: 1-2 beer daily  . Drug use: No  . Sexual activity: Never    Birth control/protection: None  Lifestyle  . Physical activity:    Days per week: Not on file    Minutes per session: Not on file  . Stress: Not on file  Relationships  . Social connections:    Talks on phone: Not on file    Gets together: Not on file    Attends religious service: Not on file    Active member of club or organization: Not on file    Attends meetings of clubs or organizations: Not on file    Relationship status: Not on file  Other Topics Concern  . Not on file  Social History Narrative  . Not on file    Additional Social History: ***  Allergies:   Allergies  Allergen Reactions  . Benadryl [Diphenhydramine] Other (See Comments)    anxious  . Hctz [Hydrochlorothiazide]     hyponatremia  . Mepergan [Meperidine-Promethazine]     Vomiting.   . Other     Antidepressants--per patient he cannot take.  Marland Kitchen Oxycodone Itching    hyperactivity  . Amoxicillin     Has patient had a PCN reaction causing immediate rash,  facial/tongue/throat swelling, SOB or lightheadedness with hypotension:No Has patient had a PCN reaction causing severe rash involving mucus membranes or skin necrosis:No Has patient had a PCN reaction that required hospitalization:No Has patient had a PCN reaction occurring within the last 10 years:Yes If all of the above answers are "NO", then may proceed with Cephalosporin use. "severe anxiety-ativan does not remedy"  . Doxycycline Anxiety  . Flagyl [Metronidazole] Anxiety  . Singulair [Montelukast Sodium] Anxiety    Metabolic Disorder Labs: No results found for: HGBA1C, MPG No results found for: PROLACTIN Lab Results  Component Value Date   CHOL 168 03/04/2017   TRIG 80 03/04/2017   HDL 78 03/04/2017   CHOLHDL 2.2 03/04/2017   LDLCALC 74 03/04/2017   Lab Results  Component Value Date   TSH 1.510 11/20/2018    Therapeutic Level Labs: No results found for: LITHIUM No results found for: CBMZ No results found for: VALPROATE  Current  Medications: Current Outpatient Medications  Medication Sig Dispense Refill  . calcium citrate-vitamin D (CITRACAL+D) 315-200 MG-UNIT per tablet Take 1 tablet by mouth daily.     . Cholecalciferol (VITAMIN D3) 3000 units TABS Take 3,000 Units by mouth daily.    . cimetidine (TAGAMET) 400 MG tablet Take 1 tablet (400 mg total) by mouth daily as needed. (Patient taking differently: Take 200 mg by mouth at bedtime as needed (for heartburn/reflux.). ) 30 tablet 5  . doxazosin (CARDURA) 4 MG tablet Take 4 mg by mouth.     . fish oil-omega-3 fatty acids 1000 MG capsule Take 2 g by mouth daily after breakfast.     . gabapentin (NEURONTIN) 600 MG tablet TAKE 1 TABLET BY MOUTH THREE TIMES DAILY (Patient not taking: Reported on 11/22/2018) 90 tablet 0  . hydrOXYzine (VISTARIL) 25 MG capsule Take 1 capsule (25 mg total) by mouth every 8 (eight) hours as needed. (Patient not taking: Reported on 11/22/2018) 90 capsule 3  . lisinopril (PRINIVIL,ZESTRIL) 20 MG  tablet Take 1 tablet (20 mg total) by mouth daily. 90 tablet 1  . LORazepam (ATIVAN) 1 MG tablet Take 1 tablet (1 mg total) by mouth every 6 (six) hours as needed for anxiety. 120 tablet 0  . Multiple Vitamin (MULTIVITAMIN) tablet Take 1 tablet by mouth daily.    . pantoprazole (PROTONIX) 40 MG tablet TAKE 1 TABLET BY MOUTH EVERY MORNING 30 tablet 11  . rOPINIRole (REQUIP) 0.25 MG tablet TAKE 1 TABLET BY MOUTH BEFORE bed FOR ONE WEEK, THEN TAKE TWO TABLETS DAILY 60 tablet 3   No current facility-administered medications for this visit.     Musculoskeletal: Strength & Muscle Tone: within normal limits Gait & Station: normal Patient leans: N/A  Psychiatric Specialty Exam: ROS  There were no vitals taken for this visit.There is no height or weight on file to calculate BMI.  General Appearance: Fairly Groomed  Eye Contact:  Good  Speech:  Clear and Coherent  Volume:  Normal  Mood:  {BHH MOOD:22306}  Affect:  {Affect (PAA):22687}  Thought Process:  Coherent  Orientation:  Full (Time, Place, and Person)  Thought Content:  Logical  Suicidal Thoughts:  {ST/HT (PAA):22692}  Homicidal Thoughts:  {ST/HT (PAA):22692}  Memory:  Immediate;   Good  Judgement:  {Judgement (PAA):22694}  Insight:  {Insight (PAA):22695}  Psychomotor Activity:  Normal  Concentration:  Concentration: Good and Attention Span: Good  Recall:  Good  Fund of Knowledge:Good  Language: Good  Akathisia:  No  Handed:  Right  AIMS (if indicated):  not done  Assets:  Communication Skills Desire for Improvement  ADL's:  Intact  Cognition: WNL  Sleep:  {BHH GOOD/FAIR/POOR:22877}   Screenings: GAD-7     Office Visit from 11/20/2018 in [place] Phone Follow Up from 09/04/2018 in [place] Phone Follow Up from 07/10/2018 in [place]  Total GAD-7 Score  4  5  5     Mini-Mental     Clinical Support from 04/17/2018 in [place]  Total Score (max 30 points )  29    PHQ2-9     Office Visit from 11/20/2018 in [place] Office Visit from 10/18/2018 in [place] [facility] Phone Follow Up from 09/04/2018 in [place] Office Visit from 07/31/2018 in [place] Phone Follow Up from 07/10/2018 in [place]  PHQ-2 Total  Score  1  3  2  2  2   PHQ-9 Total Score  5  12  7  3  3       Assessment and Plan:  Assessment  Plan  The patient demonstrates the following risk factors for suicide: Chronic risk factors for suicide include: {Chronic Risk Factors for Suicide:30414011}. Acute risk factors for suicide include: {Acute Risk Factors for NPYYFRT:02111735}. Protective factors for this patient include: {Protective Factors for Suicide APOL:41030131}. Considering these factors, the overall suicide risk at this point appears to be {Desc; low/moderate/high:110033}. Patient {ACTION; IS/IS YHO:88757972} appropriate for outpatient follow up.    Norman Clay, MD 3/18/20203:49 PM

## 2018-12-14 ENCOUNTER — Encounter (INDEPENDENT_AMBULATORY_CARE_PROVIDER_SITE_OTHER): Payer: Self-pay | Admitting: *Deleted

## 2018-12-14 ENCOUNTER — Other Ambulatory Visit (INDEPENDENT_AMBULATORY_CARE_PROVIDER_SITE_OTHER): Payer: Self-pay | Admitting: *Deleted

## 2018-12-14 DIAGNOSIS — K222 Esophageal obstruction: Secondary | ICD-10-CM

## 2018-12-14 DIAGNOSIS — D649 Anemia, unspecified: Secondary | ICD-10-CM

## 2018-12-14 DIAGNOSIS — K219 Gastro-esophageal reflux disease without esophagitis: Secondary | ICD-10-CM

## 2018-12-19 ENCOUNTER — Ambulatory Visit (HOSPITAL_COMMUNITY): Payer: Self-pay | Admitting: Psychiatry

## 2018-12-28 ENCOUNTER — Other Ambulatory Visit: Payer: Self-pay

## 2018-12-28 ENCOUNTER — Encounter: Payer: Self-pay | Admitting: Family Medicine

## 2018-12-28 ENCOUNTER — Ambulatory Visit (INDEPENDENT_AMBULATORY_CARE_PROVIDER_SITE_OTHER): Payer: Medicare Other | Admitting: Family Medicine

## 2018-12-28 DIAGNOSIS — G2581 Restless legs syndrome: Secondary | ICD-10-CM

## 2018-12-28 DIAGNOSIS — J039 Acute tonsillitis, unspecified: Secondary | ICD-10-CM | POA: Diagnosis not present

## 2018-12-28 MED ORDER — ROPINIROLE HCL 1 MG PO TABS: 1.0000 mg | ORAL_TABLET | Freq: Every day | ORAL | 0 refills | Status: DC

## 2018-12-28 MED ORDER — CEFDINIR 300 MG PO CAPS: 300.0000 mg | ORAL_CAPSULE | Freq: Two times a day (BID) | ORAL | 0 refills | Status: DC

## 2018-12-28 NOTE — Progress Notes (Signed)
Telephone visit  Subjective: Cameron Atkins legs PCP: Janora Norlander, DO [id number] Cameron Atkins is Cameron 73 y.o. male calls for telephone consult today. Patient provides verbal consent for consult held via phone.  Location of patient: home Location of provider: [facility] Others present for call: none  1. Restless legs Patient reports uncontrolled restless legs, describing tingling sensation in the legs that require him to get up in efforts to relieve him.  He has been taking 0.25 mg to 0.5 mg nightly.  He notes that sometimes he will take an extra tablet in the middle the night when he has the symptoms wake him up from sleep.  He is wondering if we can increase his dose to accommodate uncontrolled symptoms.  2.  Sore throat Patient reports several day history of Cameron right-sided sore throat.  He notes that the tonsil on the right side looks red with white patches on it.  He has associated right ear pain, particularly under the jawline.  He notes that he was seen by ear nose and throat [facility] while back and told that he has Cameron tonsillolith.  Right sided tonsillectomy was considered but he states that he was too high risk and did not proceed with the procedure.  He denies any fevers, shortness of breath.   ROS: Per HPI  Allergies  Allergen Reactions  . Benadryl [Diphenhydramine] Other (See Comments)    anxious  . Hctz [Hydrochlorothiazide]     hyponatremia  . Mepergan [Meperidine-Promethazine]     Vomiting.   . Other     Antidepressants--per patient he cannot take.  Marland Kitchen Oxycodone Itching    hyperactivity  . Amoxicillin     Has patient had Cameron PCN reaction causing immediate rash, facial/tongue/throat swelling, SOB or lightheadedness with hypotension:No Has patient had Cameron PCN reaction causing severe rash involving mucus membranes or skin necrosis:No Has patient had Cameron PCN reaction that required hospitalization:No Has patient had Cameron PCN reaction occurring within the last 10 years:Yes If all of the above answers  are "NO", then may proceed with Cephalosporin use. "severe anxiety-ativan does not remedy"  . Doxycycline Anxiety  . Flagyl [Metronidazole] Anxiety  . Singulair [Montelukast Sodium] Anxiety   Past Medical History:  Diagnosis Date  . Anxiety   . Arthritis   . Bladder stone   . Depression   . Diverticulosis of colon   . GERD (gastroesophageal reflux disease)   . H/O hiatal hernia   . Helicobacter pylori gastritis   . History of colonoscopy with polypectomy    TUBULAR ADENOMA-- 2012  . History of esophageal dilatation    STRICTURE--  LAST DONE 03/2013  . Hypertension    for 5 yrs  . IDA (iron deficiency anemia) 11/22/2018  . Left ureteral calculus   . Osteoporosis   . Wears glasses     Current Outpatient Medications:  .  calcium citrate-vitamin D (CITRACAL+D) 315-200 MG-UNIT per tablet, Take 1 tablet by mouth daily. , Disp: , Rfl:  .  Cholecalciferol (VITAMIN D3) 3000 units TABS, Take 3,000 Units by mouth daily., Disp: , Rfl:  .  cimetidine (TAGAMET) 400 MG tablet, Take 1 tablet (400 mg total) by mouth daily as needed. (Patient taking differently: Take 200 mg by mouth at bedtime as needed (for heartburn/reflux.). ), Disp: 30 tablet, Rfl: 5 .  doxazosin (CARDURA) 4 MG tablet, Take 4 mg by mouth. , Disp: , Rfl:  .  fish oil-omega-3 fatty acids 1000 MG capsule, Take 2 g by mouth daily after breakfast. ,  Disp: , Rfl:  .  gabapentin (NEURONTIN) 600 MG tablet, TAKE 1 TABLET BY MOUTH THREE TIMES DAILY (Patient not taking: Reported on 11/22/2018), Disp: 90 tablet, Rfl: 0 .  hydrOXYzine (VISTARIL) 25 MG capsule, Take 1 capsule (25 mg total) by mouth every 8 (eight) hours as needed. (Patient not taking: Reported on 11/22/2018), Disp: 90 capsule, Rfl: 3 .  lisinopril (PRINIVIL,ZESTRIL) 20 MG tablet, Take 1 tablet (20 mg total) by mouth daily., Disp: 90 tablet, Rfl: 1 .  LORazepam (ATIVAN) 1 MG tablet, Take 1 tablet (1 mg total) by mouth every 6 (six) hours as needed for anxiety., Disp: 120 tablet,  Rfl: 0 .  Multiple Vitamin (MULTIVITAMIN) tablet, Take 1 tablet by mouth daily., Disp: , Rfl:  .  pantoprazole (PROTONIX) 40 MG tablet, TAKE 1 TABLET BY MOUTH EVERY MORNING, Disp: 30 tablet, Rfl: 11  Psych: Mood stable.  Thought process tangential.  Speech somewhat pressured.  Assessment/ Plan: 73 y.o. male   1. Restless leg syndrome Not controlled.  Increase dose to 1 mg nightly.  We discussed compliance with medication and that this is not Cameron PRN medication.  We discussed potential for sedation.  He will follow-up with me in 2 weeks for interval checkup and follow-up on anxiety. - rOPINIRole (REQUIP) 1 MG tablet; Take 1 tablet (1 mg total) by mouth at bedtime. For restless leg  Dispense: 90 tablet; Refill: 0  2. Exudative tonsillitis We will empirically treat for presumed streptococcal pharyngitis given exudates and radiation to the left ear of pain.  Patient has allergy to penicillin which is described as intolerance.  I have sent in [place] twice daily for the next 10 days to cover. - cefdinir (OMNICEF) 300 MG capsule; Take 1 capsule (300 mg total) by mouth 2 (two) times daily. 1 po BID  Dispense: 20 capsule; Refill: 0   Start time: 3:15pm End time: 3:37pm  Total time spent on patient care (including telephone call/ virtual visit): 27 minutes  [place], [place] [phone]

## 2018-12-29 ENCOUNTER — Ambulatory Visit (INDEPENDENT_AMBULATORY_CARE_PROVIDER_SITE_OTHER): Payer: Medicare Other | Admitting: Licensed Clinical Social Worker

## 2018-12-29 DIAGNOSIS — I10 Essential (primary) hypertension: Secondary | ICD-10-CM | POA: Diagnosis not present

## 2018-12-29 DIAGNOSIS — K2100 Gastro-esophageal reflux disease with esophagitis, without bleeding: Secondary | ICD-10-CM

## 2018-12-29 DIAGNOSIS — F339 Major depressive disorder, recurrent, unspecified: Secondary | ICD-10-CM | POA: Diagnosis not present

## 2018-12-29 DIAGNOSIS — F419 Anxiety disorder, unspecified: Secondary | ICD-10-CM

## 2018-12-29 DIAGNOSIS — F1320 Sedative, hypnotic or anxiolytic dependence, uncomplicated: Secondary | ICD-10-CM

## 2018-12-29 NOTE — Chronic Care Management (AMB) (Signed)
  Chronic Care Management    Clinical Social Work General Follow Up Note  12/29/2018 Name: Cameron Atkins MRN: [id number] DOB: [birth year]  Cameron Atkins is a 73 y.o. year old male who is a primary care patient of Janora Norlander, DO. The CCM team was consulted for assistance with psychosocial assessment and community resources information for client.   Review of patient status, including review of consultants reports, relevant laboratory and other test results, and collaboration with appropriate care team members and the patient's provider was performed as part of comprehensive patient evaluation and provision of chronic care management services.    Social Determinants of Health:At risk for Depression    Office Visit from 11/20/2018 in [place]  PHQ-9 Total Score  5     GAD 7 : Generalized Anxiety Score 11/20/2018 09/04/2018 07/10/2018  Nervous, Anxious, on Edge 1 1 1   Control/stop worrying 1 1 1   Worry too much - different things 0 1 1  Trouble relaxing 1 1 1   Restless 0 0 0  Easily annoyed or irritable 1 0 0  Afraid - awful might happen 0 1 1  Total GAD 7 Score 4 5 5   Anxiety Difficulty Somewhat difficult Somewhat difficult Somewhat difficult   Goals Addressed            This Visit's Progress   . "I need help in managing anxiety" (pt-stated)       Current Barriers:  Marland Kitchen Mental Health Concerns (Anxiety Disorder) . Social isolation  Clinical Social Work Clinical Goal(s): Over the next 30 days, client will work with LCSW to address concerns related to management of anxiety symptoms of client  Interventions: . LCSW talked with client about management of anxiety symptoms of client and approaches for managing anxiety symptoms.  Marland Kitchen LCSW collaborated with RN CM regarding nursing needs of client . LCSW talked with client about social work needs of client . LCSW talked with client about client use of relaxation techniques to help client manage anxiety symptoms  (watching TV, completing community errands)  Patient Self Care Activities:  . Self administers medications as prescribed. . Attends all scheduled provider appointments . Talks openly about anxiety symptoms management    Plan:  . LCSW to call client in 3 weeks to further discuss management of anxiety symptoms of client. . Client to call LCSW as needed to discuss psychosocial needs of client.  .  Client to contact PCP as needed to discuss medical needs of client.   . Client to use relaxation techniques of choice to help manage anxiety symptoms (watching TV, completing community errands)  *initial goal documentation    Follow Up Plan: LCSW to call client in 3 weeks to discuss management of anxiety symptoms of client.   Norva Riffle.Chrysa Rampy MSW, LCSW Licensed Clinical Social Worker [facility] Care Management [phone]

## 2018-12-29 NOTE — Patient Instructions (Signed)
Licensed Clinical Social Worker Visit Information  Goals we discussed today:  Goals Addressed            This Visit's Progress   . "I need help in managing anxiety" (pt-stated)       Current Barriers:     Mental Health Concerns (Anxiety Disorder)   Social isolation  Clinical Social Work Clinical Goal(s): Over the next 30 days, client will work with LCSW to address concerns related to management of anxiety symptoms of client  Interventions: . LCSW talked with client about management of anxiety symptoms of client and approaches for managing anxiety symptoms.  Marland Kitchen LCSW collaborated with RN CM regarding nursing needs of client . LCSW talked with client about social work needs of client . Talked with client about use of relaxation techniques to help manage anxiety (watching TV, completing community errands)  Patient Self Care Activities:  . Self administers medications as prescribed. . Attends all scheduled provider appointments . Talks openly about anxiety symptoms management  Plan:  . LCSW to call client in 3 weeks to further discuss management of anxiety symptoms of client. . Client to call LCSW as needed to discuss psychosocial needs of client.  .  Client to contact PCP as needed to discuss medical needs of client.   . Client to use relaxation techniques of choice to help manage anxiety symptoms (watching TV, completing community errands)  *initial goal documentation    Materials Provided: No  Follow Up Plan: LCSW to call client in 3 weeks to discuss management of anxiety symptoms of client  The patient verbalized understanding of instructions provided today and declined a print copy of patient instruction materials.   Norva Riffle.Odalys Win MSW, LCSW Licensed Clinical Social Worker [facility] Care [facility] [phone]

## 2018-12-31 ENCOUNTER — Other Ambulatory Visit: Payer: Self-pay | Admitting: Pediatrics

## 2018-12-31 ENCOUNTER — Other Ambulatory Visit: Payer: Self-pay | Admitting: Family Medicine

## 2018-12-31 DIAGNOSIS — F419 Anxiety disorder, unspecified: Secondary | ICD-10-CM

## 2018-12-31 DIAGNOSIS — F1320 Sedative, hypnotic or anxiolytic dependence, uncomplicated: Secondary | ICD-10-CM

## 2018-12-31 DIAGNOSIS — Z79899 Other long term (current) drug therapy: Secondary | ICD-10-CM

## 2018-12-31 DIAGNOSIS — I10 Essential (primary) hypertension: Secondary | ICD-10-CM

## 2019-01-01 ENCOUNTER — Ambulatory Visit: Payer: Medicare Other | Admitting: Family Medicine

## 2019-01-05 DIAGNOSIS — L820 Inflamed seborrheic keratosis: Secondary | ICD-10-CM | POA: Diagnosis not present

## 2019-01-05 DIAGNOSIS — L570 Actinic keratosis: Secondary | ICD-10-CM | POA: Diagnosis not present

## 2019-01-05 DIAGNOSIS — L814 Other melanin hyperpigmentation: Secondary | ICD-10-CM | POA: Diagnosis not present

## 2019-01-05 DIAGNOSIS — L821 Other seborrheic keratosis: Secondary | ICD-10-CM | POA: Diagnosis not present

## 2019-01-08 ENCOUNTER — Other Ambulatory Visit: Payer: Self-pay

## 2019-01-08 ENCOUNTER — Ambulatory Visit: Payer: Medicare Other | Admitting: *Deleted

## 2019-01-08 DIAGNOSIS — F419 Anxiety disorder, unspecified: Secondary | ICD-10-CM

## 2019-01-08 DIAGNOSIS — G2581 Restless legs syndrome: Secondary | ICD-10-CM

## 2019-01-08 NOTE — Patient Instructions (Addendum)
Visit Information  Goals Addressed            This Visit's Progress   . "He needs help with sleeping" (pt-stated)       Current Barriers:  Marland Kitchen Knowledge Deficits related to proper sleep management strategies.  Nurse Case Manager Clinical Goal(s):  Marland Kitchen Over the next 30 days, patient will work with [facility] CCM Team and PCP to address needs related to insomnia.   Interventions:  . Evaluation of current treatment plan related to insomnia and restless leg syndrome and patient's adherence to plan as established by provider. . Reviewed medications with patient and discussed Requip and lorazepam.  . Collaborated with PCP, Dr Lajuana Ripple,  regarding medication management of insomnia.  . Discussed sleep patterns with wife, Horris Latino. She states that he gets up multiple times during the night and says that he "can't turn his mind off". He often sleeps late, getting up around 11:00 am, because of being up during the night. . Telephone f/u scheduled with Dr Lajuana Ripple  Patient [place] Activities:  . Performs ADL's independently  Initial goal documentation         The patient verbalized understanding of instructions provided today and declined a print copy of patient instruction materials.   The CM team will reach out to the patient again over the next 14 days.   appt scheduled with Dr Lajuana Ripple for 01/09/2019   Chong Sicilian, RN-BC, BSN Nurse Case Manager [place] [phone]

## 2019-01-08 NOTE — Chronic Care Management (AMB) (Signed)
  Chronic Care Management   Initial RN Case Management Telephone Outreach   01/08/2019 Name: Cameron Atkins MRN: [id number] DOB: Feb 15, 1946  Referred by: Janora Norlander, DO Reason for referral : Chronic Care Management (Initial RNCM outreach to address nursing care needs)   Cameron Atkins is a 73 y.o. year old male who is a primary care patient of Janora Norlander, DO. The CCM team was consulted for assistance with chronic disease management and care coordination needs.    Review of patient status, including review of consultants reports, relevant laboratory and other test results, and collaboration with appropriate care team members and the patient's provider was performed as part of comprehensive patient evaluation and provision of chronic care management services. Cameron Atkins and his wife, Cameron Atkins, have been talking with Cameron Como "[name]" Forrest, LCSW with the [facility] CCM Team regarding his psychosocial needs and Cameron Atkins recommended that I speak with them to address any nursing care needs.   I spoke with Cameron Atkins by telephone today regarding Cameron Atkins's insomnia and restless leg syndrome. She reports that his RLS has improved some with Requip but that he is not resting well. He gets up multiple times during the night because he "can't turn his mind off." She states that he has been taking Lorazepam for 30+ years and she questions its effectiveness at this point.    Goals Addressed    . "He needs help with sleeping" (pt-stated)       Current Barriers:  Marland Kitchen Knowledge Deficits related to proper sleep management strategies.  Nurse Case Manager Clinical Goal(s):  Marland Kitchen Over the next 30 days, patient will work with [facility] CCM Team and PCP to address needs related to insomnia.   Interventions:  . Evaluation of current treatment plan related to insomnia and restless leg syndrome and patient's adherence to plan as established by provider. . Reviewed medications with patient and discussed Requip and  lorazepam.  . Collaborated with PCP, Cameron Atkins, regarding medication management of insomnia.  . Discussed sleep patterns with wife, Cameron Atkins. She states that he gets up multiple times during the night and says that he "can't turn his mind off". He often sleeps late, getting up around 11:00 am, because of being up during the night.  . Telephone f/u scheduled with Cameron Atkins  Patient [place] Activities:  . Performs ADL's independently  Initial goal documentation         Follow up Plan  The CM team will reach out to the patient again over the next 30 days.   Telephone appt scheduled with Cameron Atkins for 01/09/2019  Chong Sicilian, RN-BC, BSN Nurse Case Manager [place] [phone]

## 2019-01-09 ENCOUNTER — Ambulatory Visit (INDEPENDENT_AMBULATORY_CARE_PROVIDER_SITE_OTHER): Payer: Medicare Other | Admitting: Family Medicine

## 2019-01-09 DIAGNOSIS — Z79899 Other long term (current) drug therapy: Secondary | ICD-10-CM | POA: Diagnosis not present

## 2019-01-09 DIAGNOSIS — G2581 Restless legs syndrome: Secondary | ICD-10-CM | POA: Diagnosis not present

## 2019-01-09 DIAGNOSIS — F1320 Sedative, hypnotic or anxiolytic dependence, uncomplicated: Secondary | ICD-10-CM | POA: Diagnosis not present

## 2019-01-09 DIAGNOSIS — F419 Anxiety disorder, unspecified: Secondary | ICD-10-CM

## 2019-01-09 MED ORDER — LORAZEPAM 1 MG PO TABS: 1.0000 mg | ORAL_TABLET | Freq: Four times a day (QID) | ORAL | 0 refills | Status: DC | PRN

## 2019-01-09 MED ORDER — ROPINIROLE HCL 0.25 MG PO TABS: 0.2500 mg | ORAL_TABLET | Freq: Every morning | ORAL | 0 refills | Status: DC

## 2019-01-09 NOTE — Progress Notes (Signed)
Telephone visit  Subjective: CC: anxiety/ restless leg PCP: Janora Norlander, DO [id number] Cameron Atkins is Cameron 73 y.o. male calls for telephone consult today. Patient provides verbal consent for consult held via phone.  Location of patient: [employer] Location of provider: working remotely Others present for call: none  1. Restless legs Patient reports ongoing issues with restless leg.  He notes at nighttime he is overall sleeping well and slept 10 hours straight last evening.  His normal bedtime is between 11 and 12.  He typically takes the Requip around 10 PM, as he likes to take it away from his supper.  He goes on to state that he continues to have daytime restless leg symptoms and wonders if he should be taking the medicine during the daytime as well.  He describes this as an internal irritation of the legs that is most notable when he is seated.  Sometimes this symptom builds up and he needs to get up and move around in efforts to relieve it.  2.  Anxiety disorder Patient reports regular use of Ativan 1 mg daily 4 times daily.  He denies excessive sedation, dizziness, falls or confusion.  He has Remeron 15 mg at home and is considering starting this medication.  Of note this was prescribed in February but he never retrieved it because he felt like he had taken Remeron in the past and it caused him flulike symptoms.  He is trying to wait into the COVID-19 outbreak has resolved before starting the medicine so as not to confuse any possible infection with possible side effects of the medicine.  He continues to drink several cups of coffee per day and does not feel that this is contributing to his anxiety symptoms, going on to state that he "is different than other people and the caffeine and if anything it causes him to be sleepy".   ROS: Per HPI  Allergies  Allergen Reactions  . Benadryl [Diphenhydramine] Other (See Comments)    anxious  . Hctz [Hydrochlorothiazide]     hyponatremia  .  Mepergan [Meperidine-Promethazine]     Vomiting.   . Other     Antidepressants--per patient he cannot take.  Marland Kitchen Oxycodone Itching    hyperactivity  . Amoxicillin     Has patient had Cameron PCN reaction causing immediate rash, facial/tongue/throat swelling, SOB or lightheadedness with hypotension:No Has patient had Cameron PCN reaction causing severe rash involving mucus membranes or skin necrosis:No Has patient had Cameron PCN reaction that required hospitalization:No Has patient had Cameron PCN reaction occurring within the last 10 years:Yes If all of the above answers are "NO", then may proceed with Cephalosporin use. "severe anxiety-ativan does not remedy"  . Doxycycline Anxiety  . Flagyl [Metronidazole] Anxiety  . Singulair [Montelukast Sodium] Anxiety   Past Medical History:  Diagnosis Date  . Anxiety   . Arthritis   . Bladder stone   . Depression   . Diverticulosis of colon   . GERD (gastroesophageal reflux disease)   . H/O hiatal hernia   . Helicobacter pylori gastritis   . History of colonoscopy with polypectomy    TUBULAR ADENOMA-- 2012  . History of esophageal dilatation    STRICTURE--  LAST DONE 03/2013  . Hypertension    for 5 yrs  . IDA (iron deficiency anemia) 11/22/2018  . Left ureteral calculus   . Osteoporosis   . Wears glasses     Current Outpatient Medications:  .  calcium citrate-vitamin D (CITRACAL+D) 315-200 MG-UNIT per tablet,  Take 1 tablet by mouth daily. , Disp: , Rfl:  .  Cholecalciferol (VITAMIN D3) 3000 units TABS, Take 3,000 Units by mouth daily., Disp: , Rfl:  .  cimetidine (TAGAMET) 400 MG tablet, Take 1 tablet (400 mg total) by mouth daily as needed. (Patient taking differently: Take 200 mg by mouth at bedtime as needed (for heartburn/reflux.). ), Disp: 30 tablet, Rfl: 5 .  doxazosin (CARDURA) 4 MG tablet, Take 4 mg by mouth. , Disp: , Rfl:  .  fish oil-omega-3 fatty acids 1000 MG capsule, Take 2 g by mouth daily after breakfast. , Disp: , Rfl:  .  gabapentin  (NEURONTIN) 600 MG tablet, TAKE 1 TABLET BY MOUTH THREE TIMES DAILY, Disp: 90 tablet, Rfl: 0 .  hydrOXYzine (VISTARIL) 25 MG capsule, Take 1 capsule (25 mg total) by mouth every 8 (eight) hours as needed., Disp: 90 capsule, Rfl: 3 .  lisinopril (PRINIVIL,ZESTRIL) 20 MG tablet, TAKE 1 TABLET BY MOUTH DAILY, Disp: 90 tablet, Rfl: 1 .  [START ON 01/11/2019] LORazepam (ATIVAN) 1 MG tablet, Take 1 tablet (1 mg total) by mouth every 6 (six) hours as needed for anxiety., Disp: 120 tablet, Rfl: 0 .  Multiple Vitamin (MULTIVITAMIN) tablet, Take 1 tablet by mouth daily., Disp: , Rfl:  .  pantoprazole (PROTONIX) 40 MG tablet, TAKE 1 TABLET BY MOUTH EVERY MORNING, Disp: 30 tablet, Rfl: 11 .  rOPINIRole (REQUIP) 1 MG tablet, Take 1 tablet (1 mg total) by mouth at bedtime. For restless leg, Disp: 90 tablet, Rfl: 0  Assessment/ Plan: 73 y.o. male   1. Anxiety Continues to have quite Cameron bit anxiety throughout the day.  He has Remeron at home and is considering starting this medication after the COVID-19 has resolved.  I have renewed his Ativan and he may refill this in 2 days.  I reviewed the narcotic database and last fill was 12/11/2018.  There were no red flags.  I would really like to get him off of the Ativan if possible, particularly given age and increased tolerance of the medication.  However, I feel that this is likely going to be difficult given how long patient has been on this medicine.  Again, I will continue to encourage seeing Cameron psychiatrist for medication management. - LORazepam (ATIVAN) 1 MG tablet; Take 1 tablet (1 mg total) by mouth every 6 (six) hours as needed for anxiety.  Dispense: 120 tablet; Refill: 0  2. Benzodiazepine dependence (HCC) As above - LORazepam (ATIVAN) 1 MG tablet; Take 1 tablet (1 mg total) by mouth every 6 (six) hours as needed for anxiety.  Dispense: 120 tablet; Refill: 0  3. High risk medication use Patient aware of risks and accepts these risks. - LORazepam (ATIVAN) 1 MG  tablet; Take 1 tablet (1 mg total) by mouth every 6 (six) hours as needed for anxiety.  Dispense: 120 tablet; Refill: 0  4. Restless leg Concerning that he is having restless leg symptoms during the daytime.  I question of parkinsonism in this patient.  I have gone ahead and given him Cameron morning dose of Requip.  He is continue the 1 mg nightly dose.  I placed Cameron referral to neurology for formal evaluation for restless leg versus Parkinson disease.  We will titrate medications accordingly if needed. - rOPINIRole (REQUIP) 0.25 MG tablet; Take 1 tablet (0.25 mg total) by mouth every morning.  Dispense: 90 tablet; Refill: 0 - Ambulatory referral to Neurology   Start time: 1:50pm End time: 2:11pm  Total time  spent on patient care (including telephone call/ virtual visit): 25 minutes  [place], [place] [phone]

## 2019-01-19 ENCOUNTER — Ambulatory Visit: Payer: Medicare Other | Admitting: Licensed Clinical Social Worker

## 2019-01-19 DIAGNOSIS — F1320 Sedative, hypnotic or anxiolytic dependence, uncomplicated: Secondary | ICD-10-CM

## 2019-01-19 DIAGNOSIS — I10 Essential (primary) hypertension: Secondary | ICD-10-CM

## 2019-01-19 DIAGNOSIS — K2100 Gastro-esophageal reflux disease with esophagitis, without bleeding: Secondary | ICD-10-CM

## 2019-01-19 DIAGNOSIS — F339 Major depressive disorder, recurrent, unspecified: Secondary | ICD-10-CM

## 2019-01-19 DIAGNOSIS — F419 Anxiety disorder, unspecified: Secondary | ICD-10-CM

## 2019-01-19 NOTE — Chronic Care Management (AMB) (Signed)
  Care Management Note   Cameron Atkins is a 73 y.o. year old male who is a primary care patient of Janora Norlander, DO. The CM team was consulted for assistance with chronic disease management and care coordination.   I reached out to [name] by phone today.   Review of patient status, including review of consultants reports, relevant laboratory and other test results, and collaboration with appropriate care team members and the patient's provider was performed as part of comprehensive patient evaluation and provision of chronic care management services.   Social Determinants of Health:Risk of Depression;    Office Visit from 11/20/2018 in [place]  PHQ-9 Total Score  5     Goals Addressed            This Visit's Progress   . "I need help in managing anxiety" (pt-stated)       Current Barriers:  Marland Kitchen Mental Health Concerns (Anxiety Disorder)  Clinical Social Work Clinical Goal(s): Over the next 30 days, client will work with LCSW to address concerns related to management of anxiety symptoms of client  Interventions: . LCSW talked with client about management of anxiety symptoms of client and approaches for managing anxiety symptoms.  Marland Kitchen LCSW talked with client about social work needs of client . LCSW talked with client about relaxation techniques of choice to help client manage anxiety symptoms (taking a walk outdoors, talking with relatives via phone)  Patient Self Care Activities:  . Self administers medications as prescribed. . Attends all scheduled provider appointments . Talks openly about anxiety symptoms management  Plan:  . LCSW to call client in next 3 weeks to further discuss management of anxiety symptoms of client. . Client to call LCSW as needed to discuss psychosocial needs of client.  .  Client to contact PCP as needed to discuss medical needs of client.   . Client to use relaxation techniques of choice to help manage anxiety  symptoms . Client to communicate with RNCM as needed to discuss nursing needs of client  *initial goal documentation    Client spoke of sleeping patterns of client and said he goes to bed each night about midnight. Cameron Atkins, spouse of client and RN CM Chong Sicilian spoke previously about sleeping challenges of client. Client has restless leg syndrome  Follow Up Plan: LCSW to call client in the next 3 weeks to further discuss management of anxiety symptoms of client  Norva Riffle.Yahmir Sokolov MSW, LCSW Licensed Clinical Social Worker [facility] Care Management [phone]

## 2019-01-19 NOTE — Patient Instructions (Signed)
Licensed Clinical Social Worker Visit Information  Goals we discussed today:  Goals Addressed            This Visit's Progress   . "I need help in managing anxiety" (pt-stated)       Current Barriers:  Marland Kitchen Mental Health Concerns (Anxiety Disorder)  Clinical Social Work Clinical Goal(s): Over the next 30 days, client will work with LCSW to address concerns related to management of anxiety symptoms of client  Interventions: . LCSW talked with client about management of anxiety symptoms of client and approaches for managing anxiety symptoms.  Marland Kitchen LCSW talked with client about social work needs of client . LCSW talked with client about relaxation techniques of choice to help client manage anxiety symptoms  Patient Self Care Activities:  . Self administers medications as prescribed. . Attends all scheduled provider appointments . Talks openly about anxiety symptoms management  Plan:  . LCSW to call client in next 3 weeks to further discuss management of anxiety symptoms of client. . Client to call LCSW as needed to discuss psychosocial needs of client.  .  Client to contact PCP as needed to discuss medical needs of client.   . Client to use relaxation techniques of choice to help manage anxiety symptoms (taking a walk, talking via phone to relatives) . Client to communicate with RNCM as needed to discuss nursing needs of client  *initial goal documentation     Materials Provided: No  Follow Up Plan: LCSW to call client in next 3 weeks to talk with client about management of anxiety symptoms of client  The patient verbalized understanding of instructions provided today and declined a print copy of patient instruction materials.   Norva Riffle.Diamante Truszkowski MSW, LCSW Licensed Clinical Social Worker [facility] [phone]

## 2019-01-23 ENCOUNTER — Telehealth: Payer: Medicare Other | Admitting: *Deleted

## 2019-02-09 ENCOUNTER — Other Ambulatory Visit: Payer: Self-pay | Admitting: Family Medicine

## 2019-02-09 ENCOUNTER — Ambulatory Visit (INDEPENDENT_AMBULATORY_CARE_PROVIDER_SITE_OTHER): Payer: Medicare Other | Admitting: Licensed Clinical Social Worker

## 2019-02-09 DIAGNOSIS — I10 Essential (primary) hypertension: Secondary | ICD-10-CM | POA: Diagnosis not present

## 2019-02-09 DIAGNOSIS — F1320 Sedative, hypnotic or anxiolytic dependence, uncomplicated: Secondary | ICD-10-CM

## 2019-02-09 DIAGNOSIS — F339 Major depressive disorder, recurrent, unspecified: Secondary | ICD-10-CM | POA: Diagnosis not present

## 2019-02-09 DIAGNOSIS — F419 Anxiety disorder, unspecified: Secondary | ICD-10-CM

## 2019-02-09 DIAGNOSIS — G2581 Restless legs syndrome: Secondary | ICD-10-CM

## 2019-02-09 DIAGNOSIS — Z79899 Other long term (current) drug therapy: Secondary | ICD-10-CM

## 2019-02-09 DIAGNOSIS — K2100 Gastro-esophageal reflux disease with esophagitis, without bleeding: Secondary | ICD-10-CM

## 2019-02-09 MED ORDER — LORAZEPAM 1 MG PO TABS: 1.0000 mg | ORAL_TABLET | Freq: Four times a day (QID) | ORAL | 1 refills | Status: DC | PRN

## 2019-02-09 MED ORDER — MIRTAZAPINE 15 MG PO TABS: 15.0000 mg | ORAL_TABLET | Freq: Every day | ORAL | 1 refills | Status: DC

## 2019-02-09 NOTE — Patient Instructions (Addendum)
Licensed Clinical Social Worker Visit Information  Goals we discussed today:  Goals Addressed            This Visit's Progress   . "I need help in managing anxiety" (pt-stated)       Current Barriers:  Marland Kitchen Mental Health Concerns (Anxiety Disorder)  Clinical Social Work Clinical Goal(s): Over the next 30 days, client will work with LCSW to address concerns related to management of anxiety symptoms of client  Interventions: . LCSW talked with client about management of anxiety symptoms of client and approaches for managing anxiety symptoms.  Marland Kitchen LCSW talked with client about social work needs of client  .  LCSW talked with client about relaxation techniques of choice to help client manage anxiety symptoms (watching TV, speaking on phone with family or friends)   Patient Self Care Activities:  . Self administers medications as prescribed. . Attends all scheduled provider appointments . Talks openly about anxiety symptoms management  Plan:  . LCSW to call client in next 3 weeks to further discuss management of anxiety symptoms of client. . Client to call LCSW as needed to discuss psychosocial needs of client.  .  Client to contact PCP as needed to discuss medical needs of client.   . Client to use relaxation techniques of choice to help manage anxiety symptoms . Client to communicate with RNCM as needed to discuss nursing needs of client  *initial goal documentation      Materials Provided: NO  Follow Up Plan: LCSW to call client in next 3 weeks to further discuss management of anxiety symptoms of client  The patient verbalized understanding of instructions provided today and declined a print copy of patient instruction materials.   Norva Riffle.Zyah Gomm MSW, LCSW Licensed Clinical Social Worker [facility] [phone]

## 2019-02-09 NOTE — Chronic Care Management (AMB) (Signed)
  Care Management Note   Cameron Atkins is a 73 y.o. year old male who is a primary care patient of Janora Norlander, DO. The CM team was consulted for assistance with chronic disease management and care coordination.   I reached out to [name] by phone today.   Review of patient status, including review of consultants reports, relevant laboratory and other test results, and collaboration with appropriate care team members and the patient's provider was performed as part of comprehensive patient evaluation and provision of chronic care management services.    Social Determinants of Health:Risk for Depression; risk for tobacco exposure    Office Visit from 11/20/2018 in [place]  PHQ-9 Total Score  5     Goals Addressed            This Visit's Progress   . "I need help in managing anxiety" (pt-stated)       Current Barriers:  Marland Kitchen Mental Health Concerns (Anxiety Disorder)  Clinical Social Work Clinical Goal(s): Over the next 30 days, client will work with LCSW to address concerns related to management of anxiety symptoms of client  Interventions: . LCSW talked with client about management of anxiety symptoms of client and approaches for managing anxiety symptoms.  Marland Kitchen LCSW talked with client about social work needs of client . LCSW talked with client about relaxation techniques of choice to help client manage anxiety symptoms (watch TV, talking on phone with family or friends)  Patient Self Care Activities:  . Self administers medications as prescribed. . Attends all scheduled provider appointments . Talks openly about anxiety symptoms management  Plan:  . LCSW to call client in next 3 weeks to further discuss management of anxiety symptoms of client. . Client to call LCSW as needed to discuss psychosocial needs of client.  .  Client to contact PCP as needed to discuss medical needs of client.   . Client to use relaxation techniques of choice to  help manage anxiety symptoms . Client to communicate with RNCM as needed to discuss nursing needs of client  *initial goal documentation     Client said he is sleeping adequately.  Client said he is eating adequately. He did not speak of any pain issues. He said he talks with family members on the phone and enjoys talking on the phone with family or friends.  Client said he drives to appointments and to complete community errands. He goes to grocery store to obtain food needed.  He wears glasses to increase vision.  He said he has slight hearing problem.  Client said he is getting low on his prescribed Ativan. He said he has enough Ativan for about one more day. He gets prescription refills with [facility].  He said Ativan is helpful to him and helps him breath well and helps him to relax.    Follow Up Plan: LCSW to call Juanda Crumble in next 3 weeks to further discuss management of anxiety symptoms of client.  Norva Riffle.Ralynn San MSW, LCSW Licensed Clinical Social Worker [facility] [phone]

## 2019-02-13 ENCOUNTER — Ambulatory Visit (INDEPENDENT_AMBULATORY_CARE_PROVIDER_SITE_OTHER): Payer: Medicare Other | Admitting: Neurology

## 2019-02-13 ENCOUNTER — Encounter: Payer: Self-pay | Admitting: Neurology

## 2019-02-13 ENCOUNTER — Other Ambulatory Visit: Payer: Self-pay

## 2019-02-13 VITALS — BP 161/82 | HR 80 | Temp 96.0°F | Ht 65.5 in | Wt 168.0 lb

## 2019-02-13 DIAGNOSIS — G2581 Restless legs syndrome: Secondary | ICD-10-CM | POA: Diagnosis not present

## 2019-02-13 NOTE — Progress Notes (Signed)
Subjective:    Patient ID: Cameron Atkins is a 73 y.o. male.  HPI     Star Age, MD, PhD [facility] [address] [place], [place] [address]  Dear Dr. Lajuana Ripple,   I saw your patient, Cameron Atkins, a new kind request in my sleep clinic today for initial consultation of his restless leg syndrome.  The patient is unaccompanied today.  As you know, Cameron Atkins is a 73 year old right-handed with an underlying medical history of iron deficiency anemia, hypertension, reflux disease, history of hiatal hernia, H. pylori gastritis, diverticulosis, depression, anxiety, arthritis and osteoporosis, who reports an approximately 76-month history of restless leg syndrome.  He has gradually increased his ropinirole, currently 1 mg each bedtime.  He has trouble staying asleep.  He has trouble going to sleep and reports that some nights he does not sleep at all.  He has had stressors, particularly with regards to his brother-in-law who has been diagnosed with cancer and his wife is helping with his care.   He has been on ropinirole for his restless leg syndrome.  He has been on quite a bit of Ativan for anxiety, takes 1 mg 4 times daily.  He also takes mirtazapine at night.  I reviewed your phone call virtual visit note from 01/09/2019.  His ropinirole was increased by a morning dose of 0.25 mg.  Of note, he also has a prescription for gabapentin.  He is in bed by 11 or midnight, wake-up time is generally around 10 AM.  He has nocturia about once per average night, some snoring is reported, Epworth sleepiness score is 3 out of 24.  He denies any episodes of gasping for air, some 2 years ago his wife had mentioned apneic pauses but not recently.  He lives with his wife, he has a 26 year old son from his first marriage, his first wife passed away.  He is divorced from his second wife, has a son and a daughter from his second wife.  He has a good relationship with his ex-wife.   He quit smoking many years ago, smoked in his 38s, he utilizes caffeine in the form of coffee about 16 ounces per day, does not drink alcohol regularly but used to drink about 2 beers per day on average.  He is a retired Horticulturist, commercial.  He denies any family history that he knows of of restless leg syndrome or sleep apnea.  He has not actually use the restless legs medicine, ropinirole 0.25 mg strength during the day as he felt no longer the need for it.  He feels that his symptoms are under control.  He does report that the 1 mg ropinirole tends to upset his stomach sometimes, he has seen a GI specialist as I understand.  He was diagnosed with asthma as a child.  He has a history of anxiety.  He feels stable currently as far as his sleep.  He denies any tremor or balance issues but had a fall about a year ago and sustained a compression fracture of his lower back.  His Past Medical History Is Significant For: Past Medical History:  Diagnosis Date  . Anxiety   . Arthritis   . Bladder stone   . Depression   . Diverticulosis of colon   . GERD (gastroesophageal reflux disease)   . H/O hiatal hernia   . Helicobacter pylori gastritis   . History of colonoscopy with polypectomy    TUBULAR ADENOMA-- 2012  .  History of esophageal dilatation    STRICTURE--  LAST DONE 03/2013  . Hypertension    for 5 yrs  . IDA (iron deficiency anemia) 11/22/2018  . Left ureteral calculus   . Osteoporosis   . Wears glasses     His Past Surgical History Is Significant For: Past Surgical History:  Procedure Laterality Date  . BIOPSY  09/10/2016   Procedure: BIOPSY;  Surgeon: Rogene Houston, MD;  [facility];  Service: Endoscopy;;  rectal polyp  . COLONOSCOPY WITH PROPOFOL N/A 09/10/2016   Procedure: COLONOSCOPY WITH PROPOFOL;  Surgeon: Rogene Houston, MD;  [facility];  Service: Endoscopy;  Laterality: N/A;  . CYSTO/  URETEROSCOPIC LITHOTRIPSY/ STONE EXTRACTION  1995  . CYSTOSCOPY W/  RETROGRADES Right 01/15/2014   Procedure: CYSTOSCOPY WITH RIGHT RETROGRADE PYELOGRAM;  Surgeon: Irine Seal, MD;  Location: [facility];  Service: Urology;  Laterality: Right;  . CYSTOSCOPY WITH RETROGRADE PYELOGRAM, URETEROSCOPY AND STENT PLACEMENT Left 01/15/2014   Procedure: CYSTOSCOPY , LEFT RETROGRADE PYELOGRAM, LEFT URETEROSCOPY, BASKET STONE EXTRACTION;  Surgeon: Irine Seal, MD;  Location: [facility];  Service: Urology;  Laterality: Left;  . ESOPHAGEAL DILATION  03/21/2013   Procedure: ESOPHAGEAL DILATION;  Surgeon: Danie Binder, MD;  [facility];  Service: Endoscopy;;  . ESOPHAGEAL DILATION N/A 09/10/2016   Procedure: ESOPHAGEAL DILATION;  Surgeon: Rogene Houston, MD;  [facility];  Service: Endoscopy;  Laterality: N/A;  . ESOPHAGOGASTRODUODENOSCOPY N/A 03/21/2013   Procedure: ESOPHAGOGASTRODUODENOSCOPY (EGD);  Surgeon: Danie Binder, MD;  [facility];  Service: Endoscopy;  Laterality: N/A;  . ESOPHAGOGASTRODUODENOSCOPY (EGD) WITH ESOPHAGEAL DILATION N/A 04/26/2013   Procedure: ESOPHAGOGASTRODUODENOSCOPY (EGD) WITH ESOPHAGEAL DILATION;  Surgeon: Rogene Houston, MD;  [facility];  Service: Endoscopy;  Laterality: N/A;  130  . ESOPHAGOGASTRODUODENOSCOPY (EGD) WITH PROPOFOL N/A 09/10/2016   Procedure: ESOPHAGOGASTRODUODENOSCOPY (EGD) WITH PROPOFOL;  Surgeon: Rogene Houston, MD;  [facility];  Service: Endoscopy;  Laterality: N/A;  10:20  . EXTRACORPOREAL SHOCK WAVE LITHOTRIPSY  X2   YRS AGO  . HOLMIUM LASER APPLICATION Left 9/32/3557   Procedure: HOLMIUM LASER APPLICATION, LEFT URETER;  Surgeon: Irine Seal, MD;  Location: [facility];  Service: Urology;  Laterality: Left;  . SHOULDER ARTHROSCOPY W/ SUBACROMIAL DECOMPRESSION AND DISTAL CLAVICLE EXCISION Left 2000    His Family History Is Significant For: History reviewed. No pertinent family history.  His Social History Is Significant  For: Social History   Socioeconomic History  . Marital status: Married    Spouse name: Not on file  . Number of children: Not on file  . Years of education: Not on file  . Highest education level: Not on file  Occupational History  . Not on file  Social Needs  . Financial resource strain: Not on file  . Food insecurity:    Worry: Not on file    Inability: Not on file  . Transportation needs:    Medical: Not on file    Non-medical: Not on file  Tobacco Use  . Smoking status: Former Smoker    Packs/day: 0.25    Years: 2.00    Pack years: 0.50    Types: Cigarettes    Last attempt to quit: 01/14/1969    Years since quitting: 50.1  . Smokeless tobacco: Former Systems developer    Types: [place] date: 01/15/1999  Substance and Sexual Activity  . Alcohol use:  Yes    Alcohol/week: 21.0 standard drinks    Types: 21 Cans of beer per week    Comment: 1-2 beer daily  . Drug use: No  . Sexual activity: Never    Birth control/protection: None  Lifestyle  . Physical activity:    Days per week: Not on file    Minutes per session: Not on file  . Stress: Not on file  Relationships  . Social connections:    Talks on phone: Not on file    Gets together: Not on file    Attends religious service: Not on file    Active member of club or organization: Not on file    Attends meetings of clubs or organizations: Not on file    Relationship status: Not on file  Other Topics Concern  . Not on file  Social History Narrative  . Not on file    His Allergies Are:  Allergies  Allergen Reactions  . Benadryl [Diphenhydramine] Other (See Comments)    anxious  . Hctz [Hydrochlorothiazide]     hyponatremia  . Mepergan [Meperidine-Promethazine]     Vomiting.   . Mirtazapine   . Other     Antidepressants--per patient he cannot take.  Marland Kitchen Oxycodone Itching    hyperactivity  . Amoxicillin     Has patient had a PCN reaction causing immediate rash, facial/tongue/throat swelling, SOB or lightheadedness  with hypotension:No Has patient had a PCN reaction causing severe rash involving mucus membranes or skin necrosis:No Has patient had a PCN reaction that required hospitalization:No Has patient had a PCN reaction occurring within the last 10 years:Yes If all of the above answers are "NO", then may proceed with Cephalosporin use. "severe anxiety-ativan does not remedy"  . Doxycycline Anxiety  . Flagyl [Metronidazole] Anxiety  . Singulair [Montelukast Sodium] Anxiety  :   His Current Medications Are:  Outpatient Encounter Medications as of 02/13/2019  Medication Sig  . calcium citrate-vitamin D (CITRACAL+D) 315-200 MG-UNIT per tablet Take 1 tablet by mouth daily.   . Cholecalciferol (VITAMIN D3) 3000 units TABS Take 3,000 Units by mouth daily.  . cimetidine (TAGAMET) 400 MG tablet Take 1 tablet (400 mg total) by mouth daily as needed. (Patient taking differently: Take 200 mg by mouth at bedtime as needed (for heartburn/reflux.). )  . doxazosin (CARDURA) 4 MG tablet Take 4 mg by mouth.   . fish oil-omega-3 fatty acids 1000 MG capsule Take 2 g by mouth daily after breakfast.   . gabapentin (NEURONTIN) 600 MG tablet TAKE 1 TABLET BY MOUTH THREE TIMES DAILY  . hydrOXYzine (VISTARIL) 25 MG capsule Take 1 capsule (25 mg total) by mouth every 8 (eight) hours as needed.  Marland Kitchen lisinopril (PRINIVIL,ZESTRIL) 20 MG tablet TAKE 1 TABLET BY MOUTH DAILY  . LORazepam (ATIVAN) 1 MG tablet Take 1 tablet (1 mg total) by mouth every 6 (six) hours as needed for anxiety.  . Multiple Vitamin (MULTIVITAMIN) tablet Take 1 tablet by mouth daily.  . pantoprazole (PROTONIX) 40 MG tablet TAKE 1 TABLET BY MOUTH EVERY MORNING  . rOPINIRole (REQUIP) 1 MG tablet Take 1 tablet (1 mg total) by mouth at bedtime. For restless leg  . [DISCONTINUED] mirtazapine (REMERON) 15 MG tablet Take 1 tablet (15 mg total) by mouth at bedtime.  . [DISCONTINUED] rOPINIRole (REQUIP) 0.25 MG tablet Take 1 tablet (0.25 mg total) by mouth every  morning.   No facility-administered encounter medications on file as of 02/13/2019.   :  Review of Systems:  Out  of a complete 14 point review of systems, all are reviewed and negative with the exception of these symptoms as listed below: Review of Systems  Neurological:       Pt presents today to discuss his restless legs. He has never had a sleep study but does endorse snoring.  Epworth Sleepiness Scale 0= would never doze 1= slight chance of dozing 2= moderate chance of dozing 3= high chance of dozing  Sitting and reading: 0 Watching TV: 1 Sitting inactive in a public place (ex. Theater or meeting): 0 As a passenger in a car for an hour without a break: 0 Lying down to rest in the afternoon: 2 Sitting and talking to someone: 0 Sitting quietly after lunch (no alcohol): 0 In a car, while stopped in traffic: 0 Total: 3     Objective:  Neurological Exam  Physical Exam Physical Examination:   Vitals:   02/13/19 1527  BP: (!) 161/82  Pulse: 80  Temp: (!) 96 F (35.6 C)    General Examination: The patient is a very pleasant 73 y.o. male in no acute distress. He appears well-developed and well-nourished and well groomed.   HEENT: Normocephalic, atraumatic, pupils are equal, round and reactive to light and accommodation. He wears corrective eyeglasses.  Funduscopic exam is difficult secondary to mild cataracts bilaterally.  Hearing is impaired bilaterally. Extraocular movements are good without limitation to gaze or nystagmus noted.  He has normal eye blink rate, facial symmetry and facial animation normal. Speech is clear with no dysarthria noted. There is no hypophonia. There is no lip, neck/head, jaw or voice tremor. Neck is supple with full range of passive and active motion. Oropharynx exam reveals: mild mouth dryness, adequate dental hygiene and mild airway crowding, due to Small airway entry and redundant soft palate, Mallampati is class II, tonsils are small.  Neck  circumference is 15-1/4 inches. Tongue protrudes centrally and palate elevates symmetrically. Chest: Clear to auscultation without wheezing, rhonchi or crackles noted. Heart: S1+S2+0, regular and normal without murmurs, rubs or gallops noted.  Abdomen: Soft, non-tender and non-distended with normal bowel sounds appreciated on auscultation. Extremities: There is no pitting edema in the distal lower extremities bilaterally. Skin: Warm and dry without trophic changes noted. Musculoskeletal: exam reveals no obvious joint deformities, tenderness or joint swelling or erythema.  Neurologically:  Mental status: The patient is awake, alert and oriented in all 4 spheres. His immediate and remote memory, attention, language skills and fund of knowledge are appropriate. There is no evidence of aphasia, agnosia, apraxia or anomia. Speech is clear with normal prosody and enunciation. Thought process is linear. Mood is normal and affect is normal.  Cranial nerves II - XII are as described above under HEENT exam. In addition: shoulder shrug is normal with equal shoulder height noted. Motor exam: Normal bulk, strength and tone is noted. There is no drift, tremor or rebound. Romberg is negative. Reflexes are 2+ throughout. Fine motor skills and coordination: intact with normal finger taps, normal hand movements, normal rapid alternating patting, normal foot taps and normal foot agility.  Cerebellar testing: No dysmetria or intention tremor on finger to nose testing. Heel to shin is unremarkable bilaterally. There is no truncal or gait ataxia.  Sensory exam: intact to light touch.  Gait, station and balance: He stands easily. No veering to one side is noted. No leaning to one side is noted. Posture is age-appropriate and stance is narrow based. Gait shows normal stride length and normal pace. No problems  turning are noted.  Assessment and Plan:   In summary, Cameron Atkins is a very pleasant 73 y.o.-year old male with  an underlying medical history of iron deficiency anemia, hypertension, reflux disease, history of hiatal hernia, H. pylori gastritis, diverticulosis, depression, anxiety, arthritis and osteoporosis, who Presents for evaluation of his restless leg syndrome.  He has been on ropinirole 1 mg strength at night for this.  He currently reports good control of his symptoms and he has not found the need to take the daytime dose that he was prescribed of the lesser strength, 0.25 mg.  He is even wondering if he should reduce his nighttime dose and take 3 of the 0.25 mg strength at night, he is encouraged to try it, there is no harm and reducing it gradually.  He is advised to follow-up currently for routine follow-up with you.  We talked about restless leg syndrome, its prognosis and treatment options and how symptoms can plateau with time.  He is also advised that anemia and iron deficiency can exacerbate RLS symptoms and optimization of his anemia treatment and iron deficiency will likely help long-term.  He does not give a telltale history for sleep apnea.  He does report some mild snoring and no recurrent apneas or gasping sensations.  He is encouraged to think about sleep study testing but is currently not keen to pursue sleep study testing, primarily for fear of not being able to sleep.  He is advised that I would be happy to order a sleep study should the need arise in the future.  I also reassured him that on examination there is no concern for parkinsonism. I suggested as needed FU with me.   Thank you very much for allowing me to participate in the care of this nice patient. If I can be of any further assistance to you please do not hesitate to call me at [phone].  Sincerely,   Star Age, MD, PhD

## 2019-02-13 NOTE — Patient Instructions (Signed)
Thank you for choosing [facility]! It was nice to meet you today! I appreciate that you entrust me with your healthcare concerns. I hope, I was able to address at least some of your concerns today, and that I can help you feel reassured.    Here is what we discussed today and what we came up with as our plan for you:     Your restless legs symptoms are currently under control. You have not had to use the smaller dose of ropinirole, 0.25 mg strength during the day as prescribed by Dr. Lajuana Ripple. I do not see any signs of Parkinsonism thankfully.  You can follow-up with Dr. Lajuana Ripple.  If he would like to reduce the dose for your ropinirole, you can certainly talk to her about taking 0.75 mg each night if you like.  I do not think you need a sleep study but I would be happy to order one if the need arises in the future. I can see you back as needed.

## 2019-02-13 NOTE — Progress Notes (Deleted)
x

## 2019-03-02 ENCOUNTER — Ambulatory Visit: Payer: Medicare Other | Admitting: Licensed Clinical Social Worker

## 2019-03-02 DIAGNOSIS — K2100 Gastro-esophageal reflux disease with esophagitis, without bleeding: Secondary | ICD-10-CM

## 2019-03-02 DIAGNOSIS — G2581 Restless legs syndrome: Secondary | ICD-10-CM

## 2019-03-02 DIAGNOSIS — F419 Anxiety disorder, unspecified: Secondary | ICD-10-CM

## 2019-03-02 DIAGNOSIS — F1320 Sedative, hypnotic or anxiolytic dependence, uncomplicated: Secondary | ICD-10-CM

## 2019-03-02 DIAGNOSIS — F339 Major depressive disorder, recurrent, unspecified: Secondary | ICD-10-CM

## 2019-03-02 DIAGNOSIS — I10 Essential (primary) hypertension: Secondary | ICD-10-CM

## 2019-03-02 NOTE — Chronic Care Management (AMB) (Signed)
  Care Management Note   Cameron Atkins is a 73 y.o. year old male who is a primary care patient of Janora Norlander, DO. The CM team was consulted for assistance with chronic disease management and care coordination.   I reached out to [name] by phone today  Review of patient status, including review of consultants reports, relevant laboratory and other test results, and collaboration with appropriate care team members and the patient's provider was performed as part of comprehensive patient evaluation and provision of chronic care management services.   Social Determinants of Health;Risk for Depression    Office Visit from 11/20/2018 in [place]  PHQ-9 Total Score  5     Goals Addressed            This Visit's Progress   . "I need help in managing anxiety" (pt-stated)       Current Barriers:  Marland Kitchen Mental Health Concerns (Anxiety Disorder)  Clinical Social Work Clinical Goal(s): Over the next 30 days, client will work with LCSW to address concerns related to management of anxiety symptoms of client  Interventions: . LCSW previously talked with client about management of anxiety symptoms of client and approaches for managing anxiety symptoms.  Marland Kitchen LCSW previously talked with client about social work needs of client . LCSW has talked on several occasions with client about relaxation techniques of choice to help client manage anxiety symptoms . Encouraged client to talk with RNCM to discuss nursing needs of client  Patient Self Care Activities:  . Self administers medications as prescribed. . Attends all scheduled provider appointments . Talks openly about anxiety symptoms management  Plan:  . LCSW to call client in next 3 weeks to further discuss management of anxiety symptoms of client. . Client to call LCSW as needed to discuss psychosocial needs of client.  .  Client to contact PCP as needed to discuss medical needs of client.   . Client to  use relaxation techniques of choice to help manage anxiety symptoms . Client to communicate with RNCM as needed to discuss nursing needs of client  *initial goal documentation     Client has difficulty sleeping. He sometimes is restless during the night and may sleep more during the day.  He has prescribed medications and is taking medications as prescribed. He is attending scheduled medical appointments.  LCSW has talked with client about relaxation techniques to help him manage symptoms faced.   Follow Up Plan: LCSW to call client in next 3 weeks to discuss management of anxiety symptoms for client  Norva Riffle.Mariateresa Batra MSW, LCSW Licensed Clinical Social Worker [facility] [phone]

## 2019-03-02 NOTE — Patient Instructions (Signed)
Licensed Clinical Social Worker Visit Information  Goals we discussed today:  Goals Addressed            This Visit's Progress   . "I need help in managing anxiety" (pt-stated)       Current Barriers:  Marland Kitchen Mental Health Concerns (Anxiety Disorder)  Clinical Social Work Clinical Goal(s): Over the next 30 days, client will work with LCSW to address concerns related to management of anxiety symptoms of client  Interventions: . LCSW previously talked with client about management of anxiety symptoms of client and approaches for managing anxiety symptoms.  Marland Kitchen LCSW previously talked with client about social work needs of client . LCSW has talked several times with client about relaxation techniques of choice to help client manage anxiety symptoms . Encouraged client to talk with RNCM to discuss nursing needs of client  Patient Self Care Activities:  . Self administers medications as prescribed. . Attends all scheduled provider appointments . Talks openly about anxiety symptoms management  Plan:  . LCSW to call client in next 3 weeks to further discuss management of anxiety symptoms of client. . Client to call LCSW as needed to discuss psychosocial needs of client.  .  Client to contact PCP as needed to discuss medical needs of client.   . Client to use relaxation techniques of choice to help manage anxiety symptoms . Client to communicate with RNCM as needed to discuss nursing needs of client  *initial goal documentation       Materials Provided: No  Follow Up Plan: LCSW to call client in next 3 weeks to further discuss management of anxiety symptoms of client  The patient/spouse of patient verbalized understanding of instructions provided today and declined a print copy of patient instruction materials.   Norva Riffle.Bryten Maher MSW, LCSW Licensed Clinical Social Worker [facility] [phone]

## 2019-03-06 ENCOUNTER — Encounter: Payer: Self-pay | Admitting: Family Medicine

## 2019-03-06 ENCOUNTER — Other Ambulatory Visit: Payer: Self-pay

## 2019-03-06 ENCOUNTER — Ambulatory Visit (INDEPENDENT_AMBULATORY_CARE_PROVIDER_SITE_OTHER): Payer: Medicare Other | Admitting: Family Medicine

## 2019-03-06 VITALS — Temp 98.8°F

## 2019-03-06 DIAGNOSIS — Z91038 Other insect allergy status: Secondary | ICD-10-CM | POA: Diagnosis not present

## 2019-03-06 MED ORDER — TRIAMCINOLONE ACETONIDE 0.1 % EX CREA: 1.0000 "application " | TOPICAL_CREAM | Freq: Two times a day (BID) | CUTANEOUS | 0 refills | Status: DC

## 2019-03-06 NOTE — Progress Notes (Signed)
Telephone visit  Subjective: CC: Tick bites PCP: Janora Norlander, DO QIW:[id number] Cameron Atkins is Cameron 73 y.o. male calls for telephone consult today. Patient provides verbal consent for consult held via phone.  Location of patient: home Location of provider: Working remotely from home Others present for call: none  1. Tick bites Patient reports that he has had several itchy tick bites/ mite bites on both legs and along his belt line.  He noticed them about 1 week ago.  He has been applying w/ hydrocortisone to the itchy areas which helped.  He notes that yesterday he "felt bad".  Denies flu like symptoms, fevers.  He reports diarrhea x3 yesterday.   ROS: Per HPI  Allergies  Allergen Reactions  . Benadryl [Diphenhydramine] Other (See Comments)    anxious  . Hctz [Hydrochlorothiazide]     hyponatremia  . Mepergan [Meperidine-Promethazine]     Vomiting.   . Mirtazapine   . Other     Antidepressants--per patient he cannot take.  Marland Kitchen Oxycodone Itching    hyperactivity  . Amoxicillin     Has patient had Cameron PCN reaction causing immediate rash, facial/tongue/throat swelling, SOB or lightheadedness with hypotension:No Has patient had Cameron PCN reaction causing severe rash involving mucus membranes or skin necrosis:No Has patient had Cameron PCN reaction that required hospitalization:No Has patient had Cameron PCN reaction occurring within the last 10 years:Yes If all of the above answers are "NO", then may proceed with Cephalosporin use. "severe anxiety-ativan does not remedy"  . Doxycycline Anxiety  . Flagyl [Metronidazole] Anxiety  . Singulair [Montelukast Sodium] Anxiety   Past Medical History:  Diagnosis Date  . Anxiety   . Arthritis   . Bladder stone   . Depression   . Diverticulosis of colon   . GERD (gastroesophageal reflux disease)   . H/O hiatal hernia   . Helicobacter pylori gastritis   . History of colonoscopy with polypectomy    TUBULAR ADENOMA-- 2012  . History of esophageal  dilatation    STRICTURE--  LAST DONE 03/2013  . Hypertension    for 5 yrs  . IDA (iron deficiency anemia) 11/22/2018  . Left ureteral calculus   . Osteoporosis   . Wears glasses     Current Outpatient Medications:  .  calcium citrate-vitamin D (CITRACAL+D) 315-200 MG-UNIT per tablet, Take 1 tablet by mouth daily. , Disp: , Rfl:  .  Cholecalciferol (VITAMIN D3) 3000 units TABS, Take 3,000 Units by mouth daily., Disp: , Rfl:  .  cimetidine (TAGAMET) 400 MG tablet, Take 1 tablet (400 mg total) by mouth daily as needed. (Patient taking differently: Take 200 mg by mouth at bedtime as needed (for heartburn/reflux.). ), Disp: 30 tablet, Rfl: 5 .  doxazosin (CARDURA) 4 MG tablet, Take 4 mg by mouth. , Disp: , Rfl:  .  fish oil-omega-3 fatty acids 1000 MG capsule, Take 2 g by mouth daily after breakfast. , Disp: , Rfl:  .  gabapentin (NEURONTIN) 600 MG tablet, TAKE 1 TABLET BY MOUTH THREE TIMES DAILY, Disp: 90 tablet, Rfl: 0 .  hydrOXYzine (VISTARIL) 25 MG capsule, Take 1 capsule (25 mg total) by mouth every 8 (eight) hours as needed., Disp: 90 capsule, Rfl: 3 .  lisinopril (PRINIVIL,ZESTRIL) 20 MG tablet, TAKE 1 TABLET BY MOUTH DAILY, Disp: 90 tablet, Rfl: 1 .  LORazepam (ATIVAN) 1 MG tablet, Take 1 tablet (1 mg total) by mouth every 6 (six) hours as needed for anxiety., Disp: 120 tablet, Rfl: 1 .  Multiple  Vitamin (MULTIVITAMIN) tablet, Take 1 tablet by mouth daily., Disp: , Rfl:  .  pantoprazole (PROTONIX) 40 MG tablet, TAKE 1 TABLET BY MOUTH EVERY MORNING, Disp: 30 tablet, Rfl: 11 .  rOPINIRole (REQUIP) 1 MG tablet, Take 1 tablet (1 mg total) by mouth at bedtime. For restless leg, Disp: 90 tablet, Rfl: 0  Assessment/ Plan: 73 y.o. male   1. Allergic reaction to insect bite We will treat topically with triamcinolone.  Okay to continue application of ice if needed.  He has hydroxyzine on hand if needed for breakthrough itching.  He will follow-up PRN - triamcinolone cream (KENALOG) 0.1 %; Apply 1  application topically 2 (two) times daily. x7 days  Dispense: 30 g; Refill: 0   Start time: 11:13am End time: 11:23am  Total time spent on patient care (including telephone call/ virtual visit): 14 minutes  [place], [place] [phone]

## 2019-03-12 ENCOUNTER — Other Ambulatory Visit: Payer: Self-pay | Admitting: Family Medicine

## 2019-03-12 DIAGNOSIS — F411 Generalized anxiety disorder: Secondary | ICD-10-CM

## 2019-03-15 ENCOUNTER — Ambulatory Visit (INDEPENDENT_AMBULATORY_CARE_PROVIDER_SITE_OTHER): Payer: Medicare Other | Admitting: Family

## 2019-03-15 ENCOUNTER — Other Ambulatory Visit: Payer: Self-pay

## 2019-03-15 ENCOUNTER — Encounter: Payer: Self-pay | Admitting: Family

## 2019-03-15 DIAGNOSIS — W57XXXA Bitten or stung by nonvenomous insect and other nonvenomous arthropods, initial encounter: Secondary | ICD-10-CM

## 2019-03-15 DIAGNOSIS — S70361D Insect bite (nonvenomous), right thigh, subsequent encounter: Secondary | ICD-10-CM | POA: Diagnosis not present

## 2019-03-15 DIAGNOSIS — S30861A Insect bite (nonvenomous) of abdominal wall, initial encounter: Secondary | ICD-10-CM

## 2019-03-15 DIAGNOSIS — S30861D Insect bite (nonvenomous) of abdominal wall, subsequent encounter: Secondary | ICD-10-CM

## 2019-03-15 DIAGNOSIS — S70361A Insect bite (nonvenomous), right thigh, initial encounter: Secondary | ICD-10-CM | POA: Diagnosis not present

## 2019-03-15 DIAGNOSIS — W57XXXD Bitten or stung by nonvenomous insect and other nonvenomous arthropods, subsequent encounter: Secondary | ICD-10-CM | POA: Diagnosis not present

## 2019-03-15 NOTE — Addendum Note (Signed)
Addended by: Earlene Plater on: 03/15/2019 03:56 PM   Modules accepted: Orders

## 2019-03-15 NOTE — Progress Notes (Signed)
   Virtual Visit via telephone Note  I connected with Cameron Atkins on 03/15/19 at 9:12 AM by telephone and verified that I am speaking with the correct person using two identifiers. Cameron Atkins is currently located at home and no one is currently with her during visit. The provider, Evelina Dun, FNP is located in their office at time of visit.  I discussed the limitations, risks, security and privacy concerns of performing an evaluation and management service by telephone and the availability of in person appointments. I also discussed with the patient that there may be a patient responsible charge related to this service. The patient expressed understanding and agreed to proceed.   History and Present Illness:  HPI Pt calls the office today with complaint of a tick bite on right medial thigh that she removed three weeks ago and then removed a tick on his lower abdomen a few days later. He had a telephone visit and was prescribed Kenalog cream for the itching. He states this helped and the itching resolved.   States this week he has "felt bad" with body aches, mild headache, and states the area where the tick were he is having itching. He also reports sweating at night.   Denies any fever or rash.  ROS   Observations/Objective: No SOB or distress noted  Assessment and Plan: [name] comes in today with chief complaint of No chief complaint on file.   Diagnosis and orders addressed:  1. Tick bite of right thigh, subsequent encounter -Do not scratch -Pt to report any new fever, joint pain, or rash -Wear protective clothing while outside- Long sleeves and long pants -Put insect repellent on all exposed skin and along clothing -Take a shower as soon as possible after being outside -Labs pending - Lyme Ab/Western Blot Reflex; Future - Rocky mtn spotted fvr abs pnl(IgG+IgM); Future  2. Tick bite of abdomen, subsequent encounter - Lyme Ab/Western Blot Reflex; Future -  Rocky mtn spotted fvr abs pnl(IgG+IgM); Future  Continue Kenalog cream as needed for itching Keep follow up with PCP    I discussed the assessment and treatment plan with the patient. The patient was provided an opportunity to ask questions and all were answered. The patient agreed with the plan and demonstrated an understanding of the instructions.   The patient was advised to call back or seek an in-person evaluation if the symptoms worsen or if the condition fails to improve as anticipated.  The above assessment and management plan was discussed with the patient. The patient verbalized understanding of and has agreed to the management plan. Patient is aware to call the clinic if symptoms persist or worsen. Patient is aware when to return to the clinic for a follow-up visit. Patient educated on when it is appropriate to go to the emergency department.   Time call ended:  9:34 AM  I provided 12 minutes of non-face-to-face time during this encounter.    Evelina Dun, FNP

## 2019-03-17 LAB — LYME AB/WESTERN BLOT REFLEX
LYME DISEASE AB, QUANT, IGM: <0.8 index (ref 0.00–0.79)
Lyme IgG/IgM Ab: <0.91 {ISR} (ref 0.00–0.90)

## 2019-03-17 LAB — ROCKY MTN SPOTTED FVR ABS PNL(IGG+IGM)
RMSF IgG: NEGATIVE
RMSF IgM: 2.03 index — ABNORMAL HIGH (ref 0.00–0.89)

## 2019-03-19 ENCOUNTER — Other Ambulatory Visit: Payer: Self-pay | Admitting: Family

## 2019-03-19 MED ORDER — DOXYCYCLINE HYCLATE 100 MG PO TABS: 100.0000 mg | ORAL_TABLET | Freq: Two times a day (BID) | ORAL | 0 refills | Status: DC

## 2019-03-23 ENCOUNTER — Ambulatory Visit (INDEPENDENT_AMBULATORY_CARE_PROVIDER_SITE_OTHER): Payer: Medicare Other | Admitting: Licensed Clinical Social Worker

## 2019-03-23 DIAGNOSIS — F419 Anxiety disorder, unspecified: Secondary | ICD-10-CM

## 2019-03-23 DIAGNOSIS — G2581 Restless legs syndrome: Secondary | ICD-10-CM

## 2019-03-23 DIAGNOSIS — F339 Major depressive disorder, recurrent, unspecified: Secondary | ICD-10-CM | POA: Diagnosis not present

## 2019-03-23 DIAGNOSIS — K2100 Gastro-esophageal reflux disease with esophagitis, without bleeding: Secondary | ICD-10-CM

## 2019-03-23 DIAGNOSIS — I10 Essential (primary) hypertension: Secondary | ICD-10-CM

## 2019-03-23 DIAGNOSIS — F1320 Sedative, hypnotic or anxiolytic dependence, uncomplicated: Secondary | ICD-10-CM

## 2019-03-23 NOTE — Chronic Care Management (AMB) (Signed)
  Chronic Care Management    Clinical Social Work CCM Outreach Note  03/23/2019 Name: Cameron Atkins MRN: [id number] DOB: Aug 17, 1946  Cameron Atkins is a 73 y.o. year old male who is a primary care patient of Janora Norlander, DO . The CCM team was consulted for assistance with assessment of psychosocial needs.   LCSW reached out to [name] today by phone    Social Determinants of Health:Risk of tobacco exposure; Risk for Depression    Office Visit from 11/20/2018 in [place]  PHQ-9 Total Score  5     GAD 7 : Generalized Anxiety Score 11/20/2018 09/04/2018 07/10/2018  Nervous, Anxious, on Edge 1 1 1   Control/stop worrying 1 1 1   Worry too much - different things 0 1 1  Trouble relaxing 1 1 1   Restless 0 0 0  Easily annoyed or irritable 1 0 0  Afraid - awful might happen 0 1 1  Total GAD 7 Score 4 5 5   Anxiety Difficulty Somewhat difficult Somewhat difficult Somewhat difficult   Goals          . "I need help in managing anxiety" (pt-stated)     Current Barriers:  Marland Kitchen Mental Health Concerns (Anxiety Disorder)  Clinical Social Work Clinical Goal(s): Over the next 30 days, client will work with LCSW to address concerns related to management of anxiety symptoms of client  Interventions: . LCSW talked with client about social work needs of client . LCSW talked with client about relaxation techniques of choice to help client manage anxiety symptoms . Encouraged client to talk with RNCM to discuss nursing needs of client  Patient Self Care Activities:  . Self administers medications as prescribed. . Attends all scheduled provider appointments . Talks openly about anxiety symptoms management  Plan:  . LCSW to call client in next 3 weeks to further discuss management of anxiety symptoms of client. . Client to call LCSW as needed to discuss psychosocial needs of client.  .  Client to use relaxation techniques of choice to help manage anxiety symptoms  . Client to communicate with RNCM as needed to discuss nursing needs of client  *initial goal documentation       He sometimes is restless during the night and may sleep more during the day.  He has prescribed medications and is taking medications as prescribed. He is attending scheduled medical appointments.  LCSW has talked with client about relaxation techniques to help him manage symptoms faced. Client spoke of recent tick bite. He is taking medications as prescribed. Client said he has reduced energy.  He said he was sleeping adequately. He said has an adequate appetite. He said he did not have a fever. He has support from his wife. He said he is taking antibiotic as prescribed. LCSW encouraged client to call RNCM to discuss nursing needs of client  Follow Up Plan: LCSW to call client in next 3 weeks to discuss management of anxiety symptoms of client  Norva Riffle.Berklee Battey MSW, LCSW Licensed Clinical Social Worker [facility] [phone]

## 2019-03-23 NOTE — Patient Instructions (Signed)
Licensed Clinical Social Worker Visit Information  Goals we discussed today:  Goals Addressed            This Visit's Progress   . "I need help in managing anxiety" (pt-stated)       Current Barriers:  Marland Kitchen Mental Health Concerns (Anxiety Disorder)  Clinical Social Work Clinical Goal(s): Over the next 30 days, client will work with LCSW to address concerns related to management of anxiety symptoms of client  Interventions:  . LCSW talked with client about social work needs of client . LCSW talked with client about relaxation techniques of choice to help client manage anxiety symptoms . Encouraged client to talk with RNCM to discuss nursing needs of client  Patient Self Care Activities:  . Self administers medications as prescribed. . Attends all scheduled provider appointments . Talks openly about anxiety symptoms management  Plan:  . LCSW to call client in next 3 weeks to further discuss management of anxiety symptoms of client. . Client to call LCSW as needed to discuss psychosocial needs of client.  .  Client to use relaxation techniques of choice to help manage anxiety symptoms . Client to communicate with RNCM as needed to discuss nursing needs of client  *initial goal documentation      Materials Provided: No  Follow Up Plan: LCSW to call client in next 3 weeks to discuss management of anxiety  symptoms of client  The patient verbalized understanding of instructions provided today and declined a print copy of patient instruction materials.    Norva Riffle.Mikia Delaluz MSW, LCSW Licensed Clinical Social Worker [facility] [phone]

## 2019-03-27 ENCOUNTER — Other Ambulatory Visit: Payer: Self-pay | Admitting: Family Medicine

## 2019-03-27 DIAGNOSIS — G2581 Restless legs syndrome: Secondary | ICD-10-CM

## 2019-03-28 ENCOUNTER — Other Ambulatory Visit: Payer: Self-pay | Admitting: Family Medicine

## 2019-03-28 DIAGNOSIS — Z79899 Other long term (current) drug therapy: Secondary | ICD-10-CM

## 2019-03-28 DIAGNOSIS — F419 Anxiety disorder, unspecified: Secondary | ICD-10-CM

## 2019-03-28 DIAGNOSIS — F1320 Sedative, hypnotic or anxiolytic dependence, uncomplicated: Secondary | ICD-10-CM

## 2019-04-02 ENCOUNTER — Telehealth: Payer: Medicare Other

## 2019-04-09 ENCOUNTER — Ambulatory Visit: Payer: Medicare Other | Admitting: Family Medicine

## 2019-04-09 ENCOUNTER — Encounter: Payer: Self-pay | Admitting: Family Medicine

## 2019-04-09 ENCOUNTER — Ambulatory Visit (INDEPENDENT_AMBULATORY_CARE_PROVIDER_SITE_OTHER): Payer: Medicare Other | Admitting: Family Medicine

## 2019-04-09 ENCOUNTER — Other Ambulatory Visit: Payer: Self-pay

## 2019-04-09 VITALS — BP 122/78 | HR 89 | Temp 97.9°F | Ht 65.0 in | Wt 164.0 lb

## 2019-04-09 DIAGNOSIS — F419 Anxiety disorder, unspecified: Secondary | ICD-10-CM

## 2019-04-09 DIAGNOSIS — F339 Major depressive disorder, recurrent, unspecified: Secondary | ICD-10-CM

## 2019-04-09 DIAGNOSIS — Z79899 Other long term (current) drug therapy: Secondary | ICD-10-CM

## 2019-04-09 DIAGNOSIS — F1320 Sedative, hypnotic or anxiolytic dependence, uncomplicated: Secondary | ICD-10-CM

## 2019-04-09 DIAGNOSIS — G2581 Restless legs syndrome: Secondary | ICD-10-CM

## 2019-04-09 MED ORDER — MIRTAZAPINE 15 MG PO TABS: 15.0000 mg | ORAL_TABLET | Freq: Every day | ORAL | 1 refills | Status: DC

## 2019-04-09 MED ORDER — LORAZEPAM 1 MG PO TABS: 1.0000 mg | ORAL_TABLET | Freq: Four times a day (QID) | ORAL | 1 refills | Status: DC | PRN

## 2019-04-09 NOTE — Progress Notes (Signed)
Subjective: CC: f/u anxiety disorder PCP: Janora Norlander, DO NOM:VEHMCNO A Tissue is a 73 y.o. male presenting to clinic today for:  1.  Anxiety disorder Patient was last evaluated via televisit in April, at which time he was planning on starting the Remeron that was prescribed to him in February.  He notes that he tried 1 but was afraid that he would develop myalgia from this medicine as he "had flulike symptoms" from this medicine previously and " did not want to get these types of symptoms possible confused with corona".  He notes that he "is not like other people and does not metabolize medicine the same".  He goes on to state that he can "drink cups of coffee in the middle night and it puts him to sleep or as a Benadryl hypes him up".  He continues to take Ativan 1 mg up to 4 times daily and shows me a bottle in which he keeps his supply for each day.  He reports keeping the bottle of Ativan very close to him at bedtime so that he can take medicine in the middle the night if needed.    He goes on to ask for as needed Requip 0.25 mg to have if needed.  When I asked him about how the neurologist felt about the 0.25 mg he states "she was okay with it".  Denies any falls.  He does feel that the restless leg is improving.  He continues to drink moderate amounts of caffeine a day.   ROS: Per HPI  Allergies  Allergen Reactions  . Benadryl [Diphenhydramine] Other (See Comments)    anxious  . Hctz [Hydrochlorothiazide]     hyponatremia  . Mepergan [Meperidine-Promethazine]     Vomiting.   . Mirtazapine   . Other     Antidepressants--per patient he cannot take.  Marland Kitchen Oxycodone Itching    hyperactivity  . Amoxicillin     Has patient had a PCN reaction causing immediate rash, facial/tongue/throat swelling, SOB or lightheadedness with hypotension:No Has patient had a PCN reaction causing severe rash involving mucus membranes or skin necrosis:No Has patient had a PCN reaction that required  hospitalization:No Has patient had a PCN reaction occurring within the last 10 years:Yes If all of the above answers are "NO", then may proceed with Cephalosporin use. "severe anxiety-ativan does not remedy"  . Doxycycline Anxiety  . Flagyl [Metronidazole] Anxiety  . Singulair [Montelukast Sodium] Anxiety   Past Medical History:  Diagnosis Date  . Anxiety   . Arthritis   . Bladder stone   . Depression   . Diverticulosis of colon   . GERD (gastroesophageal reflux disease)   . H/O hiatal hernia   . Helicobacter pylori gastritis   . History of colonoscopy with polypectomy    TUBULAR ADENOMA-- 2012  . History of esophageal dilatation    STRICTURE--  LAST DONE 03/2013  . Hypertension    for 5 yrs  . IDA (iron deficiency anemia) 11/22/2018  . Left ureteral calculus   . Osteoporosis   . Wears glasses     Current Outpatient Medications:  .  calcium citrate-vitamin D (CITRACAL+D) 315-200 MG-UNIT per tablet, Take 1 tablet by mouth daily. , Disp: , Rfl:  .  Cholecalciferol (VITAMIN D3) 3000 units TABS, Take 3,000 Units by mouth daily., Disp: , Rfl:  .  cimetidine (TAGAMET) 400 MG tablet, Take 1 tablet (400 mg total) by mouth daily as needed. (Patient taking differently: Take 200 mg by mouth at  bedtime as needed (for heartburn/reflux.). ), Disp: 30 tablet, Rfl: 5 .  doxazosin (CARDURA) 4 MG tablet, Take 4 mg by mouth. , Disp: , Rfl:  .  doxycycline (VIBRA-TABS) 100 MG tablet, Take 1 tablet (100 mg total) by mouth 2 (two) times daily for 21 days., Disp: 42 tablet, Rfl: 0 .  fish oil-omega-3 fatty acids 1000 MG capsule, Take 2 g by mouth daily after breakfast. , Disp: , Rfl:  .  gabapentin (NEURONTIN) 600 MG tablet, TAKE 1 TABLET BY MOUTH THREE TIMES DAILY, Disp: 90 tablet, Rfl: 0 .  hydrOXYzine (VISTARIL) 25 MG capsule, Take 1 capsule (25 mg total) by mouth every 8 (eight) hours as needed., Disp: 90 capsule, Rfl: 3 .  lisinopril (PRINIVIL,ZESTRIL) 20 MG tablet, TAKE 1 TABLET BY MOUTH DAILY,  Disp: 90 tablet, Rfl: 1 .  LORazepam (ATIVAN) 1 MG tablet, Take 1 tablet (1 mg total) by mouth every 6 (six) hours as needed for anxiety., Disp: 120 tablet, Rfl: 1 .  Multiple Vitamin (MULTIVITAMIN) tablet, Take 1 tablet by mouth daily., Disp: , Rfl:  .  pantoprazole (PROTONIX) 40 MG tablet, TAKE 1 TABLET BY MOUTH EVERY MORNING, Disp: 30 tablet, Rfl: 11 .  rOPINIRole (REQUIP) 1 MG tablet, TAKE 1 TABLET BY MOUTH AT BEDTIME FOR FOR RESTLESSNESS LEGS, Disp: 90 tablet, Rfl: 0 .  triamcinolone cream (KENALOG) 0.1 %, Apply 1 application topically 2 (two) times daily. x7 days, Disp: 30 g, Rfl: 0 Social History   Socioeconomic History  . Marital status: Married    Spouse name: Not on file  . Number of children: Not on file  . Years of education: Not on file  . Highest education level: Not on file  Occupational History  . Not on file  Social Needs  . Financial resource strain: Not on file  . Food insecurity    Worry: Not on file    Inability: Not on file  . Transportation needs    Medical: Not on file    Non-medical: Not on file  Tobacco Use  . Smoking status: Former Smoker    Packs/day: 0.25    Years: 2.00    Pack years: 0.50    Types: Cigarettes    Quit date: 01/14/1969    Years since quitting: 50.2  . Smokeless tobacco: Former Systems developer    Types: [place] date: 01/15/1999  Substance and Sexual Activity  . Alcohol use: Yes    Alcohol/week: 21.0 standard drinks    Types: 21 Cans of beer per week    Comment: 1-2 beer daily  . Drug use: No  . Sexual activity: Never    Birth control/protection: None  Lifestyle  . Physical activity    Days per week: Not on file    Minutes per session: Not on file  . Stress: Not on file  Relationships  . Social Herbalist on phone: Not on file    Gets together: Not on file    Attends religious service: Not on file    Active member of club or organization: Not on file    Attends meetings of clubs or organizations: Not on file     Relationship status: Not on file  . Intimate partner violence    Fear of current or ex partner: Not on file    Emotionally abused: Not on file    Physically abused: Not on file    Forced sexual activity: Not on file  Other Topics Concern  .  Not on file  Social History Narrative  . Not on file   No family history on file.  Objective: Office vital signs reviewed. BP 122/78   Pulse 89   Temp 97.9 F (36.6 C) (Oral)   Ht 5\' 5"  (1.651 m)   Wt 164 lb (74.4 kg)   BMI 27.29 kg/m   Physical Examination:  General: Awake, alert, well appearing elderly man, No acute distress HEENT: Normal, sclera white, MMM Psych: Speech normal.  Mood stable.  Tangential thought present.  Depression screen [facility] 2/9 04/09/2019 11/20/2018 10/18/2018  Decreased Interest 0 1 2  Down, Depressed, Hopeless 0 0 1  PHQ - 2 Score 0 1 3  Altered sleeping 0 1 3  Tired, decreased energy 0 1 1  Change in appetite 0 1 3  Feeling bad or failure about yourself  0 0 0  Trouble concentrating 0 1 1  Moving slowly or fidgety/restless 0 0 1  Suicidal thoughts 0 0 0  PHQ-9 Score 0 5 12  Difficult doing work/chores - - -  Some recent data might be hidden   GAD 7 : Generalized Anxiety Score 04/09/2019 11/20/2018 09/04/2018 07/10/2018  Nervous, Anxious, on Edge 3 1 1 1   Control/stop worrying 1 1 1 1   Worry too much - different things 1 0 1 1  Trouble relaxing 2 1 1 1   Restless 1 0 0 0  Easily annoyed or irritable 1 1 0 0  Afraid - awful might happen 0 0 1 1  Total GAD 7 Score 9 4 5 5   Anxiety Difficulty - Somewhat difficult Somewhat difficult Somewhat difficult    Assessment/ Plan: 73 y.o. male   1. Anxiety National cardiac database was reviewed and there were no red flags.  I had a frank discussion with him about the benzodiazepine use, particularly given age.  I certainly think that he needs to have an alternative form of medication as his mainstay of panic/anxiety and depression control.  I have reinforced that he  should consider trialing Remeron.  I am going to place a referral to psychiatry for assistance with this very complicated patient.  I do feel that there is an element of benzodiazepine dependence.  I reiterated that this medication is high risk and that it is now outside the scope of my practice since he seems reluctant to taper off of the medicine.  He will have to get future refills from a specialist if they feel that that is medically appropriate.  He may be a good candidate for testing to see which medications may be better metabolized given that he hyper focuses on his failure of multiple psychiatric medications in the past. - Ambulatory referral to Psychiatry - LORazepam (ATIVAN) 1 MG tablet; Take 1 tablet (1 mg total) by mouth every 6 (six) hours as needed for anxiety.  Dispense: 120 tablet; Refill: 1 - mirtazapine (REMERON) 15 MG tablet; Take 1 tablet (15 mg total) by mouth at bedtime.  Dispense: 90 tablet; Refill: 1  2. Depression, recurrent ([place]) As above - Ambulatory referral to Psychiatry - mirtazapine (REMERON) 15 MG tablet; Take 1 tablet (15 mg total) by mouth at bedtime.  Dispense: 90 tablet; Refill: 1  3. Benzodiazepine dependence ([place]) As above - Ambulatory referral to Psychiatry - LORazepam (ATIVAN) 1 MG tablet; Take 1 tablet (1 mg total) by mouth every 6 (six) hours as needed for anxiety.  Dispense: 120 tablet; Refill: 1  4. High risk medication use As above - Ambulatory referral to  Psychiatry - LORazepam (ATIVAN) 1 MG tablet; Take 1 tablet (1 mg total) by mouth every 6 (six) hours as needed for anxiety.  Dispense: 120 tablet; Refill: 1  5. Restless leg With regards to PRN use of Requip, I did review the neurologist note who actually advised against 0.25 mg dose during the daytime.  In fact she recommended reducing to 0.75 mg of Requip at bedtime.  For this reason, I will not add 0.25mg  back.  If patient is welcome to a second opinion by neurology if he feels that is  appropriate.   No orders of the defined types were placed in this encounter.  No orders of the defined types were placed in this encounter.   Janora Norlander, DO [place] [phone]

## 2019-04-11 ENCOUNTER — Ambulatory Visit: Payer: Medicare Other | Admitting: Family Medicine

## 2019-04-13 ENCOUNTER — Telehealth: Payer: No Typology Code available for payment source

## 2019-04-17 ENCOUNTER — Ambulatory Visit (INDEPENDENT_AMBULATORY_CARE_PROVIDER_SITE_OTHER): Payer: Medicare Other | Admitting: Licensed Clinical Social Worker

## 2019-04-17 DIAGNOSIS — I10 Essential (primary) hypertension: Secondary | ICD-10-CM

## 2019-04-17 DIAGNOSIS — K2100 Gastro-esophageal reflux disease with esophagitis, without bleeding: Secondary | ICD-10-CM

## 2019-04-17 DIAGNOSIS — F339 Major depressive disorder, recurrent, unspecified: Secondary | ICD-10-CM

## 2019-04-17 DIAGNOSIS — F419 Anxiety disorder, unspecified: Secondary | ICD-10-CM

## 2019-04-17 DIAGNOSIS — G2581 Restless legs syndrome: Secondary | ICD-10-CM

## 2019-04-17 NOTE — Chronic Care Management (AMB) (Signed)
  Care Management Note   Cameron Atkins is a 73 y.o. year old male who is a primary care patient of Janora Norlander, DO. The CM team was consulted for assistance with chronic disease management and care coordination.   I reached out to [name] via phone today.   Review of patient status, including review of consultants reports, relevant laboratory and other test results, and collaboration with appropriate care team members and the patient's provider was performed as part of comprehensive patient evaluation and provision of chronic care management services.    Social Determinants of Health:  Risk of tobacco use. Risk for depression    Office Visit from 04/09/2019 in [facility]  PHQ-9 Total Score  0     Goals        . "I need help in managing anxiety" (pt-stated)     Current Barriers:  Marland Kitchen Mental Health Concerns (Anxiety Disorder)  Clinical Social Work Clinical Goal(s): Over the next 30 days, client will work with LCSW to address concerns related to management of anxiety symptoms of client  Interventions: . LCSW talked with client about management of anxiety symptoms of client and approaches for managing anxiety symptoms.  Marland Kitchen LCSW talked with client about social support network of client . LCSW talked with client about relaxation techniques of choice to help client manage anxiety symptoms . Encouraged client to talk with RNCM to discuss nursing needs of client  Patient Self Care Activities:  . Self administers medications as prescribed. . Attends all scheduled provider appointments . Talks openly about anxiety symptoms management  Plan:  . LCSW to call client in next 3 weeks to further discuss management of anxiety symptoms of client. . Client to call LCSW as needed to discuss psychosocial needs of client.  .  Client to use relaxation techniques of choice to help manage anxiety symptoms . Client to communicate with RNCM as needed to discuss nursing  needs of client  *initial goal documentation    .    Client said he has his prescribed medications and has taken medications as prescribed.  He said he has phone call appointment this Friday with Dr. Janora Norlander.  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 Client said he has support from his wife and other family members as needed.  Follow Up Plan: LCSW to call client in next 3 weeks to discuss management of anxiety symptoms of client  Norva Riffle.Lakesa Coste MSW, LCSW Licensed Clinical Social Worker [facility] Care Management [phone]

## 2019-04-17 NOTE — Patient Instructions (Signed)
Licensed Clinical Social Worker Visit Information  Goals we discussed today:   Goals        . "I need help in managing anxiety" (pt-stated)     Current Barriers:  Marland Kitchen Mental Health Concerns (Anxiety Disorder)  Clinical Social Work Clinical Goal(s): Over the next 30 days, client will work with LCSW to address concerns related to management of anxiety symptoms of client  Interventions: . LCSW talked with client about management of anxiety symptoms of client and approaches for managing anxiety symptoms.  Marland Kitchen LCSW talked with client about social work needs of client . LCSW talked with client about relaxation techniques of choice to help client manage anxiety symptoms . Encouraged client to talk with RNCM to discuss nursing needs of client  Patient Self Care Activities:  . Self administers medications as prescribed. . Attends all scheduled provider appointments . Talks openly about anxiety symptoms management  Plan:  . LCSW to call client in next 3 weeks to further discuss management of anxiety symptoms of client. . Client to call LCSW as needed to discuss psychosocial needs of client.  .  Client to use relaxation techniques of choice to help manage anxiety symptoms . Client to communicate with RNCM as needed to discuss nursing needs of client  *initial goal documentation     Materials Provided: No  Follow Up Plan: LCSW to call client in next 3 weeks to discuss management of anxiety  symptoms of client.  The patient verbalized understanding of instructions provided today and declined a print copy of patient instruction materials.   Norva Riffle.Joshawn Crissman MSW, LCSW Licensed Clinical Social Worker [facility] Care Management [phone]

## 2019-04-18 ENCOUNTER — Other Ambulatory Visit: Payer: Self-pay | Admitting: Family Medicine

## 2019-04-18 DIAGNOSIS — G2581 Restless legs syndrome: Secondary | ICD-10-CM

## 2019-04-19 ENCOUNTER — Encounter: Payer: Medicare Other | Admitting: *Deleted

## 2019-04-20 ENCOUNTER — Ambulatory Visit (INDEPENDENT_AMBULATORY_CARE_PROVIDER_SITE_OTHER): Payer: Medicare Other | Admitting: *Deleted

## 2019-04-20 ENCOUNTER — Encounter: Payer: Self-pay | Admitting: *Deleted

## 2019-04-20 ENCOUNTER — Other Ambulatory Visit: Payer: Self-pay

## 2019-04-20 DIAGNOSIS — Z0000 Encounter for general adult medical examination without abnormal findings: Secondary | ICD-10-CM | POA: Diagnosis not present

## 2019-04-20 NOTE — Progress Notes (Signed)
MEDICARE ANNUAL WELLNESS VISIT  04/20/2019  Telephone Visit Disclaimer This Medicare AWV was conducted by telephone due to national recommendations for restrictions regarding the COVID-19 Pandemic (e.g. social distancing).  I verified, using two identifiers, that I am speaking with Cameron Atkins or their authorized healthcare agent. I discussed the limitations, risks, security, and privacy concerns of performing an evaluation and management service by telephone and the potential availability of an in-person appointment in the future. The patient expressed understanding and agreed to proceed.   Subjective:  Cameron Atkins is a 73 y.o. male patient of Janora Norlander, DO who had a Medicare Annual Wellness Visit today via telephone. Cameron Atkins is Retired and lives with their spouse. he has 3 children. he reports that he is socially active and does interact with friends/family regularly. he is not physically active and enjoys watching TV and reading.  Patient Care Team: Janora Norlander, DO as PCP - General (Family Medicine) Shea Evans, Norva Riffle, LCSW as Social Worker (Licensed Clinical Social Worker) Ilean China, RN as Case Manager  Advanced Directives 04/20/2019 04/18/2018 04/17/2018 09/10/2016 09/06/2016 07/09/2016 01/15/2014  Does Patient Have a Medical Advance Directive? No - Yes No No No Patient does not have advance directive;Patient would not like information  Type of Advance Directive - - Cameron Atkins;Living will - - - -  Does patient want to make changes to medical advance directive? - No - Patient declined - - - - -  Copy of Cameron Atkins in Chart? - - No - copy requested - - - -  Would patient like information on creating a medical advance directive? No - Patient declined - - No - Patient declined No - Patient declined No - patient declined information -  Pre-existing out of facility DNR order (yellow form or pink MOST form) - - - - - - -     Hospital Utilization Over the Past 12 Months: # of hospitalizations or ER visits: 0 # of surgeries: 0  Review of Systems    Patient reports that his overall health is worse compared to last year.  Patient Reported Readings (BP, Pulse, CBG, Weight, etc) none  Review of Systems: No complaints  All other systems negative.  Pain Assessment       Current Medications & Allergies (verified) Allergies as of 04/20/2019      Reactions   Benadryl [diphenhydramine] Other (See Comments)   anxious   Hctz [hydrochlorothiazide]    hyponatremia   Mepergan [meperidine-promethazine]    Vomiting.    Other    Antidepressants--per patient he cannot take.   Oxycodone Itching   hyperactivity   Amoxicillin    Has patient had a PCN reaction causing immediate rash, facial/tongue/throat swelling, SOB or lightheadedness with hypotension:No Has patient had a PCN reaction causing severe rash involving mucus membranes or skin necrosis:No Has patient had a PCN reaction that required hospitalization:No Has patient had a PCN reaction occurring within the last 10 years:Yes If all of the above answers are "NO", then may proceed with Cephalosporin use. "severe anxiety-ativan does not remedy"   Doxycycline Anxiety   Flagyl [metronidazole] Anxiety   Singulair [montelukast Sodium] Anxiety      Medication List       Accurate as of April 20, 2019  2:48 PM. If you have any questions, ask your nurse or doctor.        STOP taking these medications   hydrochlorothiazide 25 MG tablet Commonly known  as: HYDRODIURIL Stopped by: WYATT, AMY M, RN   mirtazapine 15 MG tablet Commonly known as: Remeron Stopped by: WYATT, AMY M, RN     TAKE these medications   calcium citrate-vitamin D 315-200 MG-UNIT tablet Commonly known as: CITRACAL+D Take 1 tablet by mouth daily.   cimetidine 400 MG tablet Commonly known as: TAGAMET Take 1 tablet (400 mg total) by mouth daily as needed. What changed:   how much to  take  when to take this  reasons to take this   doxazosin 4 MG tablet Commonly known as: CARDURA Take 4 mg by mouth.   fish oil-omega-3 fatty acids 1000 MG capsule Take 2 g by mouth daily after breakfast.   gabapentin 600 MG tablet Commonly known as: NEURONTIN TAKE 1 TABLET BY MOUTH THREE TIMES DAILY   hydrOXYzine 25 MG capsule Commonly known as: VISTARIL Take 1 capsule (25 mg total) by mouth every 8 (eight) hours as needed.   lisinopril 20 MG tablet Commonly known as: ZESTRIL TAKE 1 TABLET BY MOUTH DAILY   LORazepam 1 MG tablet Commonly known as: ATIVAN Take 1 tablet (1 mg total) by mouth every 6 (six) hours as needed for anxiety.   multivitamin tablet Take 1 tablet by mouth daily.   pantoprazole 40 MG tablet Commonly known as: PROTONIX TAKE 1 TABLET BY MOUTH EVERY MORNING   rOPINIRole 1 MG tablet Commonly known as: REQUIP TAKE 1 TABLET BY MOUTH AT BEDTIME FOR FOR RESTLESSNESS LEGS   triamcinolone cream 0.1 % Commonly known as: KENALOG Apply 1 application topically 2 (two) times daily. x7 days   Vitamin D3 75 MCG (3000 UT) Tabs Take 3,000 Units by mouth daily.       History (reviewed): Past Medical History:  Diagnosis Date  . Anxiety   . Arthritis   . Bladder stone   . Depression   . Diverticulosis of colon   . GERD (gastroesophageal reflux disease)   . H/O hiatal hernia   . Helicobacter pylori gastritis   . History of colonoscopy with polypectomy    TUBULAR ADENOMA-- 2012  . History of esophageal dilatation    STRICTURE--  LAST DONE 03/2013  . Hypertension    for 5 yrs  . IDA (iron deficiency anemia) 11/22/2018  . Left ureteral calculus   . Osteoporosis   . Wears glasses    Past Surgical History:  Procedure Laterality Date  . BIOPSY  09/10/2016   Procedure: BIOPSY;  Surgeon: Rogene Houston, MD;  [facility];  Service: Endoscopy;;  rectal polyp  . COLONOSCOPY WITH PROPOFOL N/A 09/10/2016   Procedure: COLONOSCOPY WITH PROPOFOL;   Surgeon: Rogene Houston, MD;  [facility];  Service: Endoscopy;  Laterality: N/A;  . CYSTO/  URETEROSCOPIC LITHOTRIPSY/ STONE EXTRACTION  1995  . CYSTOSCOPY W/ RETROGRADES Right 01/15/2014   Procedure: CYSTOSCOPY WITH RIGHT RETROGRADE PYELOGRAM;  Surgeon: Irine Seal, MD;  Location: [facility];  Service: Urology;  Laterality: Right;  . CYSTOSCOPY WITH RETROGRADE PYELOGRAM, URETEROSCOPY AND STENT PLACEMENT Left 01/15/2014   Procedure: CYSTOSCOPY , LEFT RETROGRADE PYELOGRAM, LEFT URETEROSCOPY, BASKET STONE EXTRACTION;  Surgeon: Irine Seal, MD;  Location[facility];  Service: Urology;  Laterality: Left;  . ESOPHAGEAL DILATION  03/21/2013   Procedure: ESOPHAGEAL DILATION;  Surgeon: Danie Binder, MD;  [facility];  Service: Endoscopy;;  . ESOPHAGEAL DILATION N/A 09/10/2016   Procedure: ESOPHAGEAL DILATION;  Surgeon: Rogene Houston, MD;  [facility];  Service: Endoscopy;  Laterality: N/A;  . ESOPHAGOGASTRODUODENOSCOPY N/A 03/21/2013   Procedure: ESOPHAGOGASTRODUODENOSCOPY (EGD);  Surgeon: Danie Binder, MD;  [facility];  Service: Endoscopy;  Laterality: N/A;  . ESOPHAGOGASTRODUODENOSCOPY (EGD) WITH ESOPHAGEAL DILATION N/A 04/26/2013   Procedure: ESOPHAGOGASTRODUODENOSCOPY (EGD) WITH ESOPHAGEAL DILATION;  Surgeon: Rogene Houston, MD;  [facility];  Service: Endoscopy;  Laterality: N/A;  130  . ESOPHAGOGASTRODUODENOSCOPY (EGD) WITH PROPOFOL N/A 09/10/2016   Procedure: ESOPHAGOGASTRODUODENOSCOPY (EGD) WITH PROPOFOL;  Surgeon: Rogene Houston, MD;  [facility];  Service: Endoscopy;  Laterality: N/A;  10:20  . EXTRACORPOREAL SHOCK WAVE LITHOTRIPSY  X2   YRS AGO  . HOLMIUM LASER APPLICATION Left 3/78/5885   Procedure: HOLMIUM LASER APPLICATION, LEFT URETER;  Surgeon: Irine Seal, MD;  Location: [facility];  Service: Urology;  Laterality: Left;  . SHOULDER ARTHROSCOPY W/ SUBACROMIAL  DECOMPRESSION AND DISTAL CLAVICLE EXCISION Left 2000   History reviewed. No pertinent family history. Social History   Socioeconomic History  . Marital status: Married    Spouse name: Horris Latino  . Number of children: 3  . Years of education: 101  . Highest education level: Some college, no degree  Occupational History  . Occupation: retired  Scientific laboratory technician  . Financial resource strain: Not hard at all  . Food insecurity    Worry: Never true    Inability: Never true  . Transportation needs    Medical: No    Non-medical: No  Tobacco Use  . Smoking status: Former Smoker    Packs/day: 0.25    Years: 2.00    Pack years: 0.50    Types: Cigarettes    Quit date: 01/14/1969    Years since quitting: 50.2  . Smokeless tobacco: Former Systems developer    Types: [place] date: 01/15/1999  Substance and Sexual Activity  . Alcohol use: Not Currently  . Drug use: No  . Sexual activity: Not Currently    Birth control/protection: None  Lifestyle  . Physical activity    Days per week: 0 days    Minutes per session: 0 min  . Stress: Not at all  Relationships  . Social Herbalist on phone: Three times a week    Gets together: More than three times a week    Attends religious service: More than 4 times per year    Active member of club or organization: No    Attends meetings of clubs or organizations: Never    Relationship status: Married  Other Topics Concern  . Not on file  Social History Narrative  . Not on file    Activities of Daily Living In your present state of health, do you have any difficulty performing the following activities: 04/20/2019  Hearing? Y  Comment sometimes he can't hear the TV  Vision? N  Difficulty concentrating or making decisions? N  Walking or climbing stairs? N  Dressing or bathing? N  Doing errands, shopping? N  Preparing Food and eating ? N  Using the Toilet? N  In the past six months, have you accidently leaked urine? Y  Comment he thinks it is  due to his enlarged prostate  Do you have problems with loss of bowel control? N  Managing your Medications? N  Managing your Finances? N  Some recent data might be hidden    Patient Education/ Literacy How often do you need to have someone help you when you read instructions, pamphlets, or  other written materials from your doctor or pharmacy?: 1 - Never What is the last grade level you completed in school?: 12th grade-some college  Exercise Current Exercise Habits: The patient does not participate in regular exercise at present, Exercise limited by: None identified  Diet Patient reports consuming 2 meals a day and 1 snack(s) a day Patient reports that his primary diet is: Regular Patient reports that she does have regular access to food.   Depression Screen PHQ 2/9 Scores 04/20/2019 04/09/2019 11/20/2018 10/18/2018 09/04/2018 07/31/2018 07/10/2018  PHQ - 2 Score 0 0 1 3 2 2 2   PHQ- 9 Score - 0 5 12 7 3 3      Fall Risk Fall Risk  04/20/2019 04/09/2019 11/20/2018 10/18/2018 07/31/2018  Falls in the past year? 0 0 1 0 1  Number falls in past yr: - - 1 - 1  Injury with Fall? - - 1 - 1  Comment - - - - -  Risk Factor Category  - - - - -  Risk for fall due to : - - - - History of fall(s)  Follow up - - - - Falls prevention discussed     Objective:  Cameron Atkins seemed alert and oriented and he participated appropriately during our telephone visit.  Blood Pressure Weight BMI  BP Readings from Last 3 Encounters:  04/09/19 122/78  02/13/19 (!) 161/82  11/22/18 (!) 148/85   Wt Readings from Last 3 Encounters:  04/09/19 164 lb (74.4 kg)  02/13/19 168 lb (76.2 kg)  11/22/18 164 lb 1.6 oz (74.4 kg)   BMI Readings from Last 1 Encounters:  04/09/19 27.29 kg/m    *Unable to obtain current vital signs, weight, and BMI due to telephone visit type  Hearing/Vision  . Kruze did not seem to have difficulty with hearing/understanding during the telephone conversation . Reports that he has  not had a formal eye exam by an eye care professional within the past year . Reports that he has not had a formal hearing evaluation within the past year *Unable to fully assess hearing and vision during telephone visit type  Cognitive Function: 6CIT Screen 04/20/2019  What Year? 0 points  What month? 0 points  What time? 0 points  Count back from 20 0 points  Months in reverse 0 points  Repeat phrase 4 points  Total Score 4   (Normal:0-7, Significant for Dysfunction: >8)  Normal Cognitive Function Screening: Yes   Immunization & Health Maintenance Record Immunization History  Administered Date(s) Administered  . Influenza, High Dose Seasonal PF 07/22/2017, 07/31/2018    Health Maintenance  Topic Date Due  . TETANUS/TDAP  01/26/1965  . PNA vac Low Risk Adult (1 of 2 - PCV13) 01/25/2010  . INFLUENZA VACCINE  04/28/2019  . COLONOSCOPY  09/10/2021  . Hepatitis C Screening  Completed       Assessment  This is a routine wellness examination for [name].  Health Maintenance: Due or Overdue Health Maintenance Due  Topic Date Due  . TETANUS/TDAP  01/26/1964  . PNA vac Low Risk Adult (1 of 2 - PCV13) 01/25/2010    [facility] does not need a referral for Community Assistance: Care Management:   no Social Work:    no Prescription Assistance:  no Nutrition/Diabetes Education:  no   Plan:  Personalized Goals Goals Addressed            This Visit's Progress   . DIET - INCREASE WATER INTAKE  Try to drink 6-8 glasses of water daily.      Personalized Health Maintenance & Screening Recommendations  Pneumococcal vaccine  Td vaccine Shingles vaccine  Lung Cancer Screening Recommended: no (Low Dose CT Chest recommended if Age 36-80 years, 30 pack-year currently smoking OR have quit w/in past 15 years) Hepatitis C Screening recommended: no HIV Screening recommended: no  Advanced Directives: Written information was not prepared per patient's request.   Referrals & Orders No orders of the defined types were placed in this encounter.   Follow-up Plan . Follow-up with Janora Norlander, DO as planned . Consider TDAP, Prevnar13 and Shingles vaccines at your next visit with your PCP   I have personally reviewed and noted the following in the patient's chart:   . Medical and social history . Use of alcohol, tobacco or illicit drugs  . Current medications and supplements . Functional ability and status . Nutritional status . Physical activity . Advanced directives . List of other physicians . Hospitalizations, surgeries, and ER visits in previous 12 months . Vitals . Screenings to include cognitive, depression, and falls . Referrals and appointments  In addition, I have reviewed and discussed with Cameron Atkins certain preventive protocols, quality metrics, and best practice recommendations. A written personalized care plan for preventive services as well as general preventive health recommendations is available and can be mailed to the patient at his request.      Marylin Crosby, LPN  3/88/7195

## 2019-04-20 NOTE — Patient Instructions (Signed)
Preventive Care 75 Years and Older, Male Preventive care refers to lifestyle choices and visits with your health care provider that can promote health and wellness. This includes:  A yearly physical exam. This is also called an annual well check.  Regular dental and eye exams.  Immunizations.  Screening for certain conditions.  Healthy lifestyle choices, such as diet and exercise. What can I expect for my preventive care visit? Physical exam Your health care provider will check:  Height and weight. These may be used to calculate body mass index (BMI), which is a measurement that tells if you are at a healthy weight.  Heart rate and blood pressure.  Your skin for abnormal spots. Counseling Your health care provider may ask you questions about:  Alcohol, tobacco, and drug use.  Emotional well-being.  Home and relationship well-being.  Sexual activity.  Eating habits.  History of falls.  Memory and ability to understand (cognition).  Work and work Statistician. What immunizations do I need?  Influenza (flu) vaccine  This is recommended every year. Tetanus, diphtheria, and pertussis (Tdap) vaccine  You may need a Td booster every 10 years. Varicella (chickenpox) vaccine  You may need this vaccine if you have not already been vaccinated. Zoster (shingles) vaccine  You may need this after age 50. Pneumococcal conjugate (PCV13) vaccine  One dose is recommended after age 24. Pneumococcal polysaccharide (PPSV23) vaccine  One dose is recommended after age 33. Measles, mumps, and rubella (MMR) vaccine  You may need at least one dose of MMR if you were born in 1957 or later. You may also need a second dose. Meningococcal conjugate (MenACWY) vaccine  You may need this if you have certain conditions. Hepatitis A vaccine  You may need this if you have certain conditions or if you travel or work in places where you may be exposed to hepatitis A. Hepatitis B vaccine   You may need this if you have certain conditions or if you travel or work in places where you may be exposed to hepatitis B. Haemophilus influenzae type b (Hib) vaccine  You may need this if you have certain conditions. You may receive vaccines as individual doses or as more than one vaccine together in one shot (combination vaccines). Talk with your health care provider about the risks and benefits of combination vaccines. What tests do I need? Blood tests  Lipid and cholesterol levels. These may be checked every 5 years, or more frequently depending on your overall health.  Hepatitis C test.  Hepatitis B test. Screening  Lung cancer screening. You may have this screening every year starting at age 74 if you have a 30-pack-year history of smoking and currently smoke or have quit within the past 15 years.  Colorectal cancer screening. All adults should have this screening starting at age 57 and continuing until age 54. Your health care provider may recommend screening at age 47 if you are at increased risk. You will have tests every 1-10 years, depending on your results and the type of screening test.  Prostate cancer screening. Recommendations will vary depending on your family history and other risks.  Diabetes screening. This is done by checking your blood sugar (glucose) after you have not eaten for a while (fasting). You may have this done every 1-3 years.  Abdominal aortic aneurysm (AAA) screening. You may need this if you are a current or former smoker.  Sexually transmitted disease (STD) testing. Follow these instructions at home: Eating and drinking  Eat  a diet that includes fresh fruits and vegetables, whole grains, lean protein, and low-fat dairy products. Limit your intake of foods with high amounts of sugar, saturated fats, and salt.  Take vitamin and mineral supplements as recommended by your health care provider.  Do not drink alcohol if your health care provider  tells you not to drink.  If you drink alcohol: ? Limit how much you have to 0-2 drinks a day. ? Be aware of how much alcohol is in your drink. In the [place] one drink equals one 12 oz bottle of beer (355 mL), one 5 oz glass of wine (148 mL), or one 1 oz glass of hard liquor (44 mL). Lifestyle  Take daily care of your teeth and gums.  Stay active. Exercise for at least 30 minutes on 5 or more days each week.  Do not use any products that contain nicotine or tobacco, such as cigarettes, e-cigarettes, and chewing tobacco. If you need help quitting, ask your health care provider.  If you are sexually active, practice safe sex. Use a condom or other form of protection to prevent STIs (sexually transmitted infections).  Talk with your health care provider about taking a low-dose aspirin or statin. What's next?  Visit your health care provider once a year for a well check visit.  Ask your health care provider how often you should have your eyes and teeth checked.  Stay up to date on all vaccines. This information is not intended to replace advice given to you by your health care provider. Make sure you discuss any questions you have with your health care provider. Document Released: 10/10/2015 Document Revised: 09/07/2018 Document Reviewed: 09/07/2018 Elsevier Patient Education  2020 Elsevier Inc.  

## 2019-04-26 ENCOUNTER — Other Ambulatory Visit: Payer: Self-pay | Admitting: *Deleted

## 2019-04-26 DIAGNOSIS — F411 Generalized anxiety disorder: Secondary | ICD-10-CM

## 2019-04-26 MED ORDER — GABAPENTIN 600 MG PO TABS: 600.0000 mg | ORAL_TABLET | Freq: Three times a day (TID) | ORAL | 0 refills | Status: DC

## 2019-05-08 ENCOUNTER — Ambulatory Visit (INDEPENDENT_AMBULATORY_CARE_PROVIDER_SITE_OTHER): Payer: Medicare Other | Admitting: Licensed Clinical Social Worker

## 2019-05-08 DIAGNOSIS — F1320 Sedative, hypnotic or anxiolytic dependence, uncomplicated: Secondary | ICD-10-CM

## 2019-05-08 DIAGNOSIS — F339 Major depressive disorder, recurrent, unspecified: Secondary | ICD-10-CM | POA: Diagnosis not present

## 2019-05-08 DIAGNOSIS — K2100 Gastro-esophageal reflux disease with esophagitis, without bleeding: Secondary | ICD-10-CM

## 2019-05-08 DIAGNOSIS — G2581 Restless legs syndrome: Secondary | ICD-10-CM

## 2019-05-08 DIAGNOSIS — I10 Essential (primary) hypertension: Secondary | ICD-10-CM

## 2019-05-08 DIAGNOSIS — F419 Anxiety disorder, unspecified: Secondary | ICD-10-CM

## 2019-05-08 NOTE — Patient Instructions (Addendum)
Licensed Clinical Social Worker Visit Information  Materials Provided: No  Client said he has his prescribed medications and has taken medications as prescribed.  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 Client said he has support from his wife and other family members as needed. LCSW has talked with client about relaxation techniques of choice for client.  Client has talked with Dr. Lajuana Ripple about medication needs of client.  Dr. Lajuana Ripple has made a  psychiatry referral for client. Client said he has been taking Ativan as prescribed for a number of years. Client said he has a rash on his inner thigh. He has appointment tomorrow with dermatologist. LCSW thanked client for phone call with LCSW on 05/08/2019  Follow Up Plan: LCSW to call client in next 3 weeks to talk with client about psychosocial needs of client  The patient verbalized understanding of instructions provided today and declined a print copy of patient instruction materials.   Norva Riffle.Jedd Schulenburg MSW, LCSW Licensed Clinical Social Worker [facility] [phone]

## 2019-05-08 NOTE — Chronic Care Management (AMB) (Signed)
  Care Management Note   Cameron Atkins is a 73 y.o. year old male who is a primary care patient of Janora Norlander, DO. The CM team was consulted for assistance with chronic disease management and care coordination.   I reached out to [name] by phone today.   Review of patient status, including review of consultants reports, relevant laboratory and other test results, and collaboration with appropriate care team members and the patient's provider was performed as part of comprehensive patient evaluation and provision of chronic care management services.   Social Determinants of Health: risk of social isolation; risk of tobacco use    Office Visit from 04/09/2019 in [place]  PHQ-9 Total Score  0     GAD 7 : Generalized Anxiety Score 04/09/2019 11/20/2018 09/04/2018 07/10/2018  Nervous, Anxious, on Edge 3 1 1 1   Control/stop worrying 1 1 1 1   Worry too much - different things 1 0 1 1  Trouble relaxing 2 1 1 1   Restless 1 0 0 0  Easily annoyed or irritable 1 1 0 0  Afraid - awful might happen 0 0 1 1  Total GAD 7 Score 9 4 5 5   Anxiety Difficulty - Somewhat difficult Somewhat difficult Somewhat difficult   Client said he has his prescribed medications and has taken medications as prescribed.  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 Client said he has support from his wife and other family members as needed. LCSW has talked with client about relaxation techniques of choice for client.  Client has talked with Dr. Lajuana Ripple about medication needs of client.  Dr. Lajuana Ripple has made a  psychiatry referral for client. Client said he has been taking Ativan as prescribed for a number of years. Client said he has a rash on his inner thigh. He has appointment tomorrow with dermatologist. LCSW thanked client for phone call with LCSW on 05/08/2019  Follow Up Plan: LCSW to call client in next 3 weeks to talk with client about  psychosocial needs of client  Norva Riffle.Murry Khiev MSW, LCSW Licensed Clinical Social Worker [facility] [phone]

## 2019-05-09 DIAGNOSIS — L304 Erythema intertrigo: Secondary | ICD-10-CM | POA: Diagnosis not present

## 2019-05-11 ENCOUNTER — Ambulatory Visit (INDEPENDENT_AMBULATORY_CARE_PROVIDER_SITE_OTHER): Payer: Medicare Other | Admitting: Family Medicine

## 2019-05-11 DIAGNOSIS — G2581 Restless legs syndrome: Secondary | ICD-10-CM

## 2019-05-11 DIAGNOSIS — Z79899 Other long term (current) drug therapy: Secondary | ICD-10-CM | POA: Diagnosis not present

## 2019-05-11 DIAGNOSIS — F419 Anxiety disorder, unspecified: Secondary | ICD-10-CM

## 2019-05-11 DIAGNOSIS — F1320 Sedative, hypnotic or anxiolytic dependence, uncomplicated: Secondary | ICD-10-CM | POA: Diagnosis not present

## 2019-05-11 MED ORDER — ROPINIROLE HCL 0.25 MG PO TABS: 0.7500 mg | ORAL_TABLET | Freq: Every day | ORAL | 0 refills | Status: DC

## 2019-05-11 MED ORDER — LORAZEPAM 1 MG PO TABS: 1.0000 mg | ORAL_TABLET | Freq: Four times a day (QID) | ORAL | 1 refills | Status: DC | PRN

## 2019-05-11 NOTE — Progress Notes (Signed)
Telephone visit  Subjective: CC: restless PCP: Janora Norlander, DO OMV:[id number] A Mcdaid is a 73 y.o. male calls for telephone consult today. Patient provides verbal consent for consult held via phone.  Location of patient: home Location of provider: Working remotely from home Others present for call: none  1. Restless leg syndrome Patient reports that symptoms are predominately in the right leg.  He reports that it's a tingling feeling that is deep inside.  Symptoms onset about 7-8 pm.  Symptoms are relieved by Requip.  He has not had any daytime symptoms.  At last visit with neurology it was advised to try and decrease to 0.75mg  qhs.    ROS: Per HPI  Allergies  Allergen Reactions  . Benadryl [Diphenhydramine] Other (See Comments)    anxious  . Hctz [Hydrochlorothiazide]     hyponatremia  . Mepergan [Meperidine-Promethazine]     Vomiting.   . Other     Antidepressants--per patient he cannot take.  Marland Kitchen Oxycodone Itching    hyperactivity  . Amoxicillin     Has patient had a PCN reaction causing immediate rash, facial/tongue/throat swelling, SOB or lightheadedness with hypotension:No Has patient had a PCN reaction causing severe rash involving mucus membranes or skin necrosis:No Has patient had a PCN reaction that required hospitalization:No Has patient had a PCN reaction occurring within the last 10 years:Yes If all of the above answers are "NO", then may proceed with Cephalosporin use. "severe anxiety-ativan does not remedy"  . Doxycycline Anxiety  . Flagyl [Metronidazole] Anxiety  . Singulair [Montelukast Sodium] Anxiety   Past Medical History:  Diagnosis Date  . Anxiety   . Arthritis   . Bladder stone   . Depression   . Diverticulosis of colon   . GERD (gastroesophageal reflux disease)   . H/O hiatal hernia   . Helicobacter pylori gastritis   . History of colonoscopy with polypectomy    TUBULAR ADENOMA-- 2012  . History of esophageal dilatation    STRICTURE--   LAST DONE 03/2013  . Hypertension    for 5 yrs  . IDA (iron deficiency anemia) 11/22/2018  . Left ureteral calculus   . Osteoporosis   . Wears glasses     Current Outpatient Medications:  .  calcium citrate-vitamin D (CITRACAL+D) 315-200 MG-UNIT per tablet, Take 1 tablet by mouth daily. , Disp: , Rfl:  .  Cholecalciferol (VITAMIN D3) 3000 units TABS, Take 3,000 Units by mouth daily., Disp: , Rfl:  .  cimetidine (TAGAMET) 400 MG tablet, Take 1 tablet (400 mg total) by mouth daily as needed. (Patient taking differently: Take 200 mg by mouth at bedtime as needed (for heartburn/reflux.). ), Disp: 30 tablet, Rfl: 5 .  doxazosin (CARDURA) 4 MG tablet, Take 4 mg by mouth. , Disp: , Rfl:  .  fish oil-omega-3 fatty acids 1000 MG capsule, Take 2 g by mouth daily after breakfast. , Disp: , Rfl:  .  gabapentin (NEURONTIN) 600 MG tablet, Take 1 tablet (600 mg total) by mouth 3 (three) times daily., Disp: 90 tablet, Rfl: 0 .  hydrOXYzine (VISTARIL) 25 MG capsule, Take 1 capsule (25 mg total) by mouth every 8 (eight) hours as needed., Disp: 90 capsule, Rfl: 3 .  lisinopril (PRINIVIL,ZESTRIL) 20 MG tablet, TAKE 1 TABLET BY MOUTH DAILY, Disp: 90 tablet, Rfl: 1 .  LORazepam (ATIVAN) 1 MG tablet, Take 1 tablet (1 mg total) by mouth every 6 (six) hours as needed for anxiety., Disp: 120 tablet, Rfl: 1 .  Multiple Vitamin (MULTIVITAMIN)  tablet, Take 1 tablet by mouth daily., Disp: , Rfl:  .  pantoprazole (PROTONIX) 40 MG tablet, TAKE 1 TABLET BY MOUTH EVERY MORNING, Disp: 30 tablet, Rfl: 11 .  triamcinolone cream (KENALOG) 0.1 %, Apply 1 application topically 2 (two) times daily. x7 days, Disp: 30 g, Rfl: 0  Psych: tangential thought. Mood stable.  Assessment/ Plan: 73 y.o. male   1. Restless leg Will try to reduce to 0.75mg  qhs.  Discussed taking medication as directed.  He tends to want to dissolve all of his medications in his mouth.  Advised to swallow pill as directed.  Follow up in 3 months, sooner if  needed. - rOPINIRole (REQUIP) 0.25 MG tablet; Take 3-4 tablets (0.75-1 mg total) by mouth at bedtime.  Dispense: 360 tablet; Refill: 0  2. Anxiety Stable.  Has appt with Dr Modesta Messing in October. The Narcotic Database has been reviewed.  There were no red flags.   - LORazepam (ATIVAN) 1 MG tablet; Take 1 tablet (1 mg total) by mouth every 6 (six) hours as needed for anxiety.  Dispense: 120 tablet; Refill: 1  3. Benzodiazepine dependence (HCC) - LORazepam (ATIVAN) 1 MG tablet; Take 1 tablet (1 mg total) by mouth every 6 (six) hours as needed for anxiety.  Dispense: 120 tablet; Refill: 1  4. High risk medication use - LORazepam (ATIVAN) 1 MG tablet; Take 1 tablet (1 mg total) by mouth every 6 (six) hours as needed for anxiety.  Dispense: 120 tablet; Refill: 1   Start time: 11:00am End time: 11:16am  Total time spent on patient care (including telephone call/ virtual visit): 21 minutes  [place], [place] [phone]

## 2019-05-30 ENCOUNTER — Other Ambulatory Visit: Payer: Self-pay | Admitting: Family Medicine

## 2019-05-30 DIAGNOSIS — F411 Generalized anxiety disorder: Secondary | ICD-10-CM

## 2019-05-31 ENCOUNTER — Ambulatory Visit: Payer: Medicare Other | Admitting: Licensed Clinical Social Worker

## 2019-05-31 DIAGNOSIS — F419 Anxiety disorder, unspecified: Secondary | ICD-10-CM

## 2019-05-31 DIAGNOSIS — G2581 Restless legs syndrome: Secondary | ICD-10-CM

## 2019-05-31 DIAGNOSIS — I10 Essential (primary) hypertension: Secondary | ICD-10-CM

## 2019-05-31 DIAGNOSIS — F339 Major depressive disorder, recurrent, unspecified: Secondary | ICD-10-CM

## 2019-05-31 DIAGNOSIS — F1320 Sedative, hypnotic or anxiolytic dependence, uncomplicated: Secondary | ICD-10-CM

## 2019-05-31 DIAGNOSIS — K2100 Gastro-esophageal reflux disease with esophagitis, without bleeding: Secondary | ICD-10-CM

## 2019-05-31 NOTE — Chronic Care Management (AMB) (Signed)
Care Management Note   Cameron Atkins is a 73 y.o. year old male who is a primary care patient of Janora Norlander, DO. The CM team was consulted for assistance with chronic disease management and care coordination.   I reached out to [name] by phone today.   Review of patient status, including review of consultants reports, relevant laboratory and other test results, and collaboration with appropriate care team members and the patient's provider was performed as part of comprehensive patient evaluation and provision of chronic care management services.   Social Determinants of Health: risk of social isolation; risk of tobacco use; risk of physical inactivity    Office Visit from 04/09/2019 in [facility]  PHQ-9 Total Score  0     GAD 7 : Generalized Anxiety Score 04/09/2019 11/20/2018 09/04/2018 07/10/2018  Nervous, Anxious, on Edge 3 1 1 1   Control/stop worrying 1 1 1 1   Worry too much - different things 1 0 1 1  Trouble relaxing 2 1 1 1   Restless 1 0 0 0  Easily annoyed or irritable 1 1 0 0  Afraid - awful might happen 0 0 1 1  Total GAD 7 Score 9 4 5 5   Anxiety Difficulty - Somewhat difficult Somewhat difficult Somewhat difficult   Medications   New medications from outside sources are available for reconciliation   calcium citrate-vitamin D (CITRACAL+D) 315-200 MG-UNIT per tablet    Cholecalciferol (VITAMIN D3) 3000 units TABS    cimetidine (TAGAMET) 400 MG tablet    doxazosin (CARDURA) 4 MG tablet    fish oil-omega-3 fatty acids 1000 MG capsule    gabapentin (NEURONTIN) 600 MG tablet    hydrOXYzine (VISTARIL) 25 MG capsule    lisinopril (PRINIVIL,ZESTRIL) 20 MG tablet    LORazepam (ATIVAN) 1 MG tablet    Multiple Vitamin (MULTIVITAMIN) tablet    pantoprazole (PROTONIX) 40 MG tablet    rOPINIRole (REQUIP) 0.25 MG tablet    triamcinolone cream (KENALOG) 0.1 %    Goals        . "I need help in managing anxiety" (pt-stated)     Current  Barriers:  Marland Kitchen Mental Health Concerns (Anxiety Disorder)  Clinical Social Work Clinical Goal(s): Over the next 30 days, client will work with LCSW to address concerns related to management of anxiety symptoms of client  Interventions: . LCSW talked with client about management of anxiety symptoms of client and approaches for managing anxiety symptoms.  Marland Kitchen LCSW talked with client about social work needs of client . LCSW talked with client about relaxation techniques of choice to help client manage anxiety symptoms . Encouraged client to talk with RNCM to discuss nursing needs of client  Patient Self Care Activities:  . Self administers medications as prescribed. . Attends all scheduled provider appointments . Talks openly about anxiety symptoms management  Plan:  . LCSW to call client in next 3 weeks to further discuss management of anxiety symptoms of client. . Client to call LCSW as needed to discuss psychosocial needs of client.  .  Client to use relaxation techniques of choice to help manage anxiety symptoms . Client to communicate with RNCM as needed to discuss nursing needs of client  *initial goal documentation      Client said he has his prescribed medications and has taken medications as prescribed. 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 Client said he likes to read occasionally to help him relax. Client  said he has support from his wife and other family members as needed. LCSW has talked with client about relaxation techniques of choice for client.  Client has talked with Dr. Lajuana Ripple about medication needs of client.  Dr. Lajuana Ripple had made a  psychiatry referral for client. Client has appointment with psychiatrist, Dr. Modesta Messing, in October of 2020 at [facility].LCSW provided counseling support for client.  Client has previously  said he has been taking Ativan as prescribed for a number of years. Client said he is sleeping  adequately. He said he is eating adequately . LCSW encouraged client to call RNCM as needed to discuss nursing needs of client  Follow Up Plan: LCSW to call client in next 3 weeks to further discuss management of anxiety symptoms of client  Norva Riffle.Marleta Lapierre MSW, LCSW Licensed Clinical Social Worker [facility] [phone]

## 2019-05-31 NOTE — Patient Instructions (Addendum)
Licensed Clinical Social Worker Visit Information  Goals we discussed today:  Goals        . "I need help in managing anxiety" (pt-stated)     Current Barriers:  Marland Kitchen Mental Health Concerns (Anxiety Disorder)  Clinical Social Work Clinical Goal(s): Over the next 30 days, client will work with LCSW to address concerns related to management of anxiety symptoms of client  Interventions:  . LCSW talked with client about management of anxiety symptoms of client and approaches for managing anxiety symptoms.  Marland Kitchen LCSW talked with client about social work needs of client . LCSW talked with client about relaxation techniques of choice to help client manage anxiety symptoms . Encouraged client to talk with RNCM to discuss nursing needs of client  Patient Self Care Activities:  . Self administers medications as prescribed. . Attends all scheduled provider appointments . Talks openly about anxiety symptoms management  Plan:  . LCSW to call client in next 3 weeks to further discuss management of anxiety symptoms of client. . Client to call LCSW as needed to discuss psychosocial needs of client.  .  Client to use relaxation techniques of choice to help manage anxiety symptoms . Client to communicate with RNCM as needed to discuss nursing needs of client  *initial goal documentation        Materials Provided: No  Follow Up Plan: LCSW to call client in next 3 weeks to further discuss management of anxiety  symptoms of client  The patient verbalized understanding of instructions provided today and declined a print copy of patient instruction materials.   Norva Riffle.Timofey Carandang MSW, LCSW Licensed Clinical Social Worker [facility] [phone]

## 2019-06-12 ENCOUNTER — Telehealth: Payer: Self-pay | Admitting: Family Medicine

## 2019-06-12 NOTE — Telephone Encounter (Signed)
appt scheduled Pt notified 

## 2019-06-13 ENCOUNTER — Encounter: Payer: Self-pay | Admitting: Family Medicine

## 2019-06-13 ENCOUNTER — Other Ambulatory Visit: Payer: Self-pay

## 2019-06-13 ENCOUNTER — Ambulatory Visit (INDEPENDENT_AMBULATORY_CARE_PROVIDER_SITE_OTHER): Payer: Medicare Other | Admitting: Family Medicine

## 2019-06-13 VITALS — BP 145/82 | HR 70 | Temp 97.3°F | Resp 16 | Ht 65.0 in | Wt 159.4 lb

## 2019-06-13 DIAGNOSIS — L299 Pruritus, unspecified: Secondary | ICD-10-CM

## 2019-06-13 DIAGNOSIS — F419 Anxiety disorder, unspecified: Secondary | ICD-10-CM

## 2019-06-13 DIAGNOSIS — F1320 Sedative, hypnotic or anxiolytic dependence, uncomplicated: Secondary | ICD-10-CM | POA: Diagnosis not present

## 2019-06-13 NOTE — Progress Notes (Signed)
Subjective: CC: insect bite PCP: Janora Norlander, DO [id number] Cameron Atkins is Cameron 73 y.o. male presenting to clinic today for:  1.  Insect bite Patient reports that he sustained Cameron insect bite to the right lower extremity several days ago.  It was irritated and red and stinging but it has since gotten better.  2.  Anxiety Patient goes on to state that he has quite Cameron bit of anxiety surrounding his upcoming appointment with psychiatry.  He reiterates that he has been on benzodiazepines for many years and failed transition over to other medications in the past.  He further expands upon the fact that he has significant anxiety within the family and in fact his grandson who is 6 years old is currently being worked up for anxiety disorder or other behavioral disorders.  He denies that the Ativan causes him to be excessively sedated, have memory changes or any falls.  He notes that he is "not typical" and does not respond to medications like other people.   ROS: Per HPI  Allergies  Allergen Reactions  . Benadryl [Diphenhydramine] Other (See Comments)    anxious  . Hctz [Hydrochlorothiazide]     hyponatremia  . Mepergan [Meperidine-Promethazine]     Vomiting.   . Other     Antidepressants--per patient he cannot take.  Marland Kitchen Oxycodone Itching    hyperactivity  . Amoxicillin     Has patient had Cameron PCN reaction causing immediate rash, facial/tongue/throat swelling, SOB or lightheadedness with hypotension:No Has patient had Cameron PCN reaction causing severe rash involving mucus membranes or skin necrosis:No Has patient had Cameron PCN reaction that required hospitalization:No Has patient had Cameron PCN reaction occurring within the last 10 years:Yes If all of the above answers are "NO", then may proceed with Cephalosporin use. "severe anxiety-ativan does not remedy"  . Doxycycline Anxiety  . Flagyl [Metronidazole] Anxiety  . Singulair [Montelukast Sodium] Anxiety   Past Medical History:  Diagnosis Date  .  Anxiety   . Arthritis   . Bladder stone   . Depression   . Diverticulosis of colon   . GERD (gastroesophageal reflux disease)   . H/O hiatal hernia   . Helicobacter pylori gastritis   . History of colonoscopy with polypectomy    TUBULAR ADENOMA-- 2012  . History of esophageal dilatation    STRICTURE--  LAST DONE 03/2013  . Hypertension    for 5 yrs  . IDA (iron deficiency anemia) 11/22/2018  . Left ureteral calculus   . Osteoporosis   . Wears glasses     Current Outpatient Medications:  .  calcium citrate-vitamin D (CITRACAL+D) 315-200 MG-UNIT per tablet, Take 1 tablet by mouth daily. , Disp: , Rfl:  .  Cholecalciferol (VITAMIN D3) 3000 units TABS, Take 3,000 Units by mouth daily., Disp: , Rfl:  .  cimetidine (TAGAMET) 400 MG tablet, Take 1 tablet (400 mg total) by mouth daily as needed. (Patient taking differently: Take 200 mg by mouth at bedtime as needed (for heartburn/reflux.). ), Disp: 30 tablet, Rfl: 5 .  doxazosin (CARDURA) 4 MG tablet, Take 4 mg by mouth. , Disp: , Rfl:  .  fish oil-omega-3 fatty acids 1000 MG capsule, Take 2 g by mouth daily after breakfast. , Disp: , Rfl:  .  gabapentin (NEURONTIN) 600 MG tablet, TAKE 1 TABLET BY MOUTH THREE TIMES DAILY, Disp: 90 tablet, Rfl: 2 .  hydrOXYzine (VISTARIL) 25 MG capsule, Take 1 capsule (25 mg total) by mouth every 8 (eight) hours as  needed., Disp: 90 capsule, Rfl: 3 .  lisinopril (PRINIVIL,ZESTRIL) 20 MG tablet, TAKE 1 TABLET BY MOUTH DAILY, Disp: 90 tablet, Rfl: 1 .  LORazepam (ATIVAN) 1 MG tablet, Take 1 tablet (1 mg total) by mouth every 6 (six) hours as needed for anxiety., Disp: 120 tablet, Rfl: 1 .  Multiple Vitamin (MULTIVITAMIN) tablet, Take 1 tablet by mouth daily., Disp: , Rfl:  .  pantoprazole (PROTONIX) 40 MG tablet, TAKE 1 TABLET BY MOUTH EVERY MORNING, Disp: 30 tablet, Rfl: 11 .  rOPINIRole (REQUIP) 0.25 MG tablet, Take 3-4 tablets (0.75-1 mg total) by mouth at bedtime., Disp: 360 tablet, Rfl: 0 .  triamcinolone  cream (KENALOG) 0.1 %, Apply 1 application topically 2 (two) times daily. x7 days, Disp: 30 g, Rfl: 0 Social History   Socioeconomic History  . Marital status: Married    Spouse name: Horris Latino  . Number of children: 3  . Years of education: 79  . Highest education level: Some college, no degree  Occupational History  . Occupation: retired  Scientific laboratory technician  . Financial resource strain: Not hard at all  . Food insecurity    Worry: Never true    Inability: Never true  . Transportation needs    Medical: No    Non-medical: No  Tobacco Use  . Smoking status: Former Smoker    Packs/day: 0.25    Years: 2.00    Pack years: 0.50    Types: Cigarettes    Quit date: 01/14/1969    Years since quitting: 50.4  . Smokeless tobacco: Former Systems developer    Types: Mazie date: 01/15/1999  Substance and Sexual Activity  . Alcohol use: Not Currently  . Drug use: No  . Sexual activity: Not Currently    Birth control/protection: None  Lifestyle  . Physical activity    Days per week: 0 days    Minutes per session: 0 min  . Stress: Not at all  Relationships  . Social Herbalist on phone: Three times Cameron week    Gets together: More than three times Cameron week    Attends religious service: More than 4 times per year    Active member of club or organization: No    Attends meetings of clubs or organizations: Never    Relationship status: Married  . Intimate partner violence    Fear of current or ex partner: No    Emotionally abused: No    Physically abused: No    Forced sexual activity: No  Other Topics Concern  . Not on file  Social History Narrative  . Not on file   History reviewed. No pertinent family history.  Objective: Office vital signs reviewed. BP (!) 145/82   Pulse 70   Temp (!) 97.3 F (36.3 C) (Temporal)   Resp 16   Ht 5\' 5"  (1.651 m)   Wt 159 lb 6.4 oz (72.3 kg)   SpO2 95%   BMI 26.53 kg/m   Physical Examination:  General: Awake, alert, well nourished, No acute  distress Skin: dry; intact; no rashes or lesions Psych: Anxious, thought is tangential, speech is pressured  Assessment/ Plan: 73 y.o. male   1. Itchy skin He has Vistaril on hand at home and encouraged him to use it should itchiness recur.  2. Anxiety We again had Cameron long discussion with regards to benzodiazepine use, his need for large doses, etc.  I cited the risks, particularly given age and dose.  Patient seem  to hyperfocus on the fact that he has been on the medication for many years.  I reiterated that he needed evaluation by psychiatry as the amount of medication that he is on is not something that I am willing to continue prescribing for him.  He is reluctant to taper off or switch to alternatives.  I am asking that psychiatry weigh-in and assist with medications.  I think that he would benefit from Cameron 24-hour acting medication.  He is currently being followed by clinical social work here in the office for counseling.  We discussed the possibility that he may need to be tapered off of the medication.  3. Benzodiazepine dependence ([place])  Total time spent with patient 29 minutes.  Greater than 50% of encounter spent in coordination of care/counseling.   No orders of the defined types were placed in this encounter.  No orders of the defined types were placed in this encounter.    Janora Norlander, DO [place] [phone]

## 2019-06-19 ENCOUNTER — Other Ambulatory Visit: Payer: Self-pay | Admitting: Family Medicine

## 2019-06-19 DIAGNOSIS — I10 Essential (primary) hypertension: Secondary | ICD-10-CM

## 2019-06-26 ENCOUNTER — Other Ambulatory Visit: Payer: Self-pay | Admitting: Family Medicine

## 2019-06-26 ENCOUNTER — Ambulatory Visit (INDEPENDENT_AMBULATORY_CARE_PROVIDER_SITE_OTHER): Payer: Medicare Other | Admitting: Licensed Clinical Social Worker

## 2019-06-26 DIAGNOSIS — F339 Major depressive disorder, recurrent, unspecified: Secondary | ICD-10-CM | POA: Diagnosis not present

## 2019-06-26 DIAGNOSIS — K2100 Gastro-esophageal reflux disease with esophagitis, without bleeding: Secondary | ICD-10-CM

## 2019-06-26 DIAGNOSIS — F419 Anxiety disorder, unspecified: Secondary | ICD-10-CM

## 2019-06-26 DIAGNOSIS — F1320 Sedative, hypnotic or anxiolytic dependence, uncomplicated: Secondary | ICD-10-CM

## 2019-06-26 DIAGNOSIS — G2581 Restless legs syndrome: Secondary | ICD-10-CM

## 2019-06-26 NOTE — Chronic Care Management (AMB) (Signed)
  Care Management Note   Cameron Atkins is a 73 y.o. year old male who is a primary care patient of Janora Norlander, DO. The CM team was consulted for assistance with chronic disease management and care coordination.   I reached out to [name] by phone today.     Review of patient status, including review of consultants reports, relevant laboratory and other test results, and collaboration with appropriate care team members and the patient's provider was performed as part of comprehensive patient evaluation and provision of chronic care management services.   Social Determinants of Health :risk of social isolation; risk of tobacco use;risk of physical inactivity    Office Visit from 04/09/2019 in [place] [facility]  PHQ-9 Total Score  0     GAD 7 : Generalized Anxiety Score 04/09/2019 11/20/2018 09/04/2018 07/10/2018  Nervous, Anxious, on Edge 3 1 1 1   Control/stop worrying 1 1 1 1   Worry too much - different things 1 0 1 1  Trouble relaxing 2 1 1 1   Restless 1 0 0 0  Easily annoyed or irritable 1 1 0 0  Afraid - awful might happen 0 0 1 1  Total GAD 7 Score 9 4 5 5   Anxiety Difficulty - Somewhat difficult Somewhat difficult Somewhat difficult   Medications    calcium citrate-vitamin D (CITRACAL+D) 315-200 MG-UNIT per tablet    Cholecalciferol (VITAMIN D3) 3000 units TABS    cimetidine (TAGAMET) 400 MG tablet    doxazosin (CARDURA) 4 MG tablet    fish oil-omega-3 fatty acids 1000 MG capsule    gabapentin (NEURONTIN) 600 MG tablet    hydrOXYzine (VISTARIL) 25 MG capsule    lisinopril (ZESTRIL) 20 MG tablet    LORazepam (ATIVAN) 1 MG tablet    Multiple Vitamin (MULTIVITAMIN) tablet    pantoprazole (PROTONIX) 40 MG tablet    rOPINIRole (REQUIP) 0.25 MG tablet    triamcinolone cream (KENALOG) 0.1 %    Goals        . "I need help in managing anxiety" (pt-stated)     Current Barriers:  Marland Kitchen Mental Health Concerns (Anxiety Disorder)  Clinical Social Work  Clinical Goal(s): Over the next 30 days, client will work with LCSW to address concerns related to management of anxiety symptoms of client  Interventions: . LCSW previously talked with client about management of anxiety symptoms of client and approaches for managing anxiety symptoms.  Marland Kitchen LCSW talked previously with client about social work needs of client . Encouraged client to talk with RNCM to discuss nursing needs of client  . Talked with client about upcoming client appointment with psychiatrist in October of 2020  Patient Self Care Activities:  . Self administers medications as prescribed. . Attends all scheduled provider appointments . Talks openly about anxiety symptoms management  Plan:  . LCSW to call client in next 3 weeks to further discuss management of anxiety symptoms of client. . Client to call LCSW as needed to discuss psychosocial needs of client.  .  Client to use relaxation techniques of choice to help manage anxiety symptoms . Client to communicate with RNCM as needed to discuss nursing needs of client  *initial goal documentation      Follow Up Plan: LCSW to call client in next 3 weeks to discuss management of anxiety symptoms of client  Norva Riffle.Deliliah Spranger MSW, LCSW Licensed Clinical Social Worker [facility] [phone]

## 2019-06-26 NOTE — Patient Instructions (Signed)
Licensed Clinical Social Worker Visit Information  Goals we discussed today:  Goals        . "I need help in managing anxiety" (pt-stated)     Current Barriers:  Marland Kitchen Mental Health Concerns (Anxiety Disorder)  Clinical Social Work Clinical Goal(s): Over the next 30 days, client will work with LCSW to address concerns related to management of anxiety symptoms of client  Interventions: . LCSW previously talked with client about management of anxiety symptoms of client and approaches for managing anxiety symptoms.  Marland Kitchen LCSW talked previously with client about social work needs of client  .  LCSW talked with client about upcoming client appointment with psychiatrist in October of 2020   . Encouraged client to talk with RNCM to discuss nursing needs of client  Patient Self Care Activities:  . Self administers medications as prescribed.  . Attends all scheduled provider appointments . Talks openly about anxiety symptoms management  Plan:  . LCSW to call client in next 3 weeks to further discuss management of anxiety symptoms of client. . Client to call LCSW as needed to discuss psychosocial needs of client.  .  Client to use relaxation techniques of choice to help manage anxiety symptoms . Client to communicate with RNCM as needed to discuss nursing needs of client  *initial goal documentation        Materials Provided: No  Follow Up Plan: LCSW to call client in next 3 weeks to discuss management of anxiety symptoms of client  The patient verbalized understanding of instructions provided today and declined a print copy of patient instruction materials.   Norva Riffle.Burdette Gergely MSW, LCSW Licensed Clinical Social Worker [facility] [phone]

## 2019-06-26 NOTE — Progress Notes (Signed)
Virtual Visit via Video Note  I connected with [place] on 07/03/19 at  2:00 PM EDT by a video enabled telemedicine application and verified that I am speaking with the correct person using two identifiers.   I discussed the limitations of evaluation and management by telemedicine and the availability of in person appointments. The patient expressed understanding and agreed to proceed.     I discussed the assessment and treatment plan with the patient. The patient was provided an opportunity to ask questions and all were answered. The patient agreed with the plan and demonstrated an understanding of the instructions.   The patient was advised to call back or seek an in-person evaluation if the symptoms worsen or if the condition fails to improve as anticipated.  I provided 50 minutes of non-face-to-face time during this encounter.   Norman Clay, MD      Psychiatric Initial Adult Assessment   Patient Identification: AHIL RAFFO MRN:  [id number] Date of Evaluation:  06/26/2019 Referral Source: Janora Norlander, DO Chief Complaint:  "She does not want to give me Ativan anymore" Visit Diagnosis: No diagnosis found.  History of Present Illness:   Cameron Atkins is a 73 y.o. year old male with a history of depression, benzodiazepine dependence, restless leg syndrome, iron deficiency anemia, hypertension, GERD, who is referred for depression.   His wife, Maneesh Licata presents to the interview with patient consent.   He states that he is referred by his primary care doctor as "she does not want to give me Ativan anymore."  He states that he has been on Ativan for 35 years, and was prescribed by many providers. He states that no providers had any concern about his medication until recent. He passed drug tests, and he has not had any "high" from the medication. He is now concerned as his PCP told him to discontinue the medication. He has chest tightness with anxiety when he missed  to take lorazepam. Although he is usually able to feel relaxed, he tends to feel tightness in his muscle when he does not take lorazepam. His daily routine depends on how much he sleeps at night. He goes out, mow glasses and does vacuum when he sleeps well the previous night. He may watch TV or takes a walk when he had insomnia the previous night. He enjoys reading books. 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 He feels gratified by helping other people.   He occasionally feels sad (although his wife comments that he is this way "lot of times," he states that it rarely happens for no reason). He has insomnia (sleeps at 1 am, wakes up around 10-11 am). He has fair motivation and energy. He denies difficulty with concentration.  He denies SI.  He denies irritability. He has good appetite. He denies alcohol use, denies drug use. Although he does have occasional memory loss, he is not concerned about it (while his wife does have concern of his memory issues).   Medication- Lorazepam 1 mg QID, last filled on 9/9 for 30 days   Associated Signs/Symptoms: Depression Symptoms:  fatigue, anxiety, (Hypo) Manic Symptoms:  denies decreasd need for sleep, euphoria Anxiety Symptoms:  Excessive Worry, Panic Symptoms, Psychotic Symptoms:  denies AH, VH PTSD Symptoms:  Negative  Past Psychiatric History:  Outpatient: seen by psychiatrist years ago for anxiety, reportedly he had "ADD" Psychiatry admission: denies  Previous suicide attempt: denies  Past  trials of medication: Paxil, mirtazapine (felt like he had a flu), hydroxyzine  History of violence: denies   Previous Psychotropic Medications: Yes   Substance Abuse History in the last 12 months:  No.  Consequences of Substance Abuse: NA  Past Medical History:  Past Medical History:  Diagnosis Date  . Anxiety   . Arthritis   . Bladder stone   . Depression   . Diverticulosis of colon   . GERD  (gastroesophageal reflux disease)   . H/O hiatal hernia   . Helicobacter pylori gastritis   . History of colonoscopy with polypectomy    TUBULAR ADENOMA-- 2012  . History of esophageal dilatation    STRICTURE--  LAST DONE 03/2013  . Hypertension    for 5 yrs  . IDA (iron deficiency anemia) 11/22/2018  . Left ureteral calculus   . Osteoporosis   . Wears glasses     Past Surgical History:  Procedure Laterality Date  . BIOPSY  09/10/2016   Procedure: BIOPSY;  Surgeon: Rogene Houston, MD;  [facility];  Service: Endoscopy;;  rectal polyp  . COLONOSCOPY WITH PROPOFOL N/A 09/10/2016   Procedure: COLONOSCOPY WITH PROPOFOL;  Surgeon: Rogene Houston, MD;  [facility];  Service: Endoscopy;  Laterality: N/A;  . CYSTO/  URETEROSCOPIC LITHOTRIPSY/ STONE EXTRACTION  1995  . CYSTOSCOPY W/ RETROGRADES Right 01/15/2014   Procedure: CYSTOSCOPY WITH RIGHT RETROGRADE PYELOGRAM;  Surgeon: Irine Seal, MD;  Location[facility];  Service: Urology;  Laterality: Right;  . CYSTOSCOPY WITH RETROGRADE PYELOGRAM, URETEROSCOPY AND STENT PLACEMENT Left 01/15/2014   Procedure: CYSTOSCOPY , LEFT RETROGRADE PYELOGRAM, LEFT URETEROSCOPY, BASKET STONE EXTRACTION;  Surgeon: Irine Seal, MD;  Location: [facility];  Service: Urology;  Laterality: Left;  . ESOPHAGEAL DILATION  03/21/2013   Procedure: ESOPHAGEAL DILATION;  Surgeon: Danie Binder, MD;  [facility];  Service: Endoscopy;;  . ESOPHAGEAL DILATION N/A 09/10/2016   Procedure: ESOPHAGEAL DILATION;  Surgeon: Rogene Houston, MD;  [facility];  Service: Endoscopy;  Laterality: N/A;  . ESOPHAGOGASTRODUODENOSCOPY N/A 03/21/2013   Procedure: ESOPHAGOGASTRODUODENOSCOPY (EGD);  Surgeon: Danie Binder, MD;  [facility];  Service: Endoscopy;  Laterality: N/A;  . ESOPHAGOGASTRODUODENOSCOPY (EGD) WITH ESOPHAGEAL DILATION N/A 04/26/2013   Procedure: ESOPHAGOGASTRODUODENOSCOPY (EGD) WITH  ESOPHAGEAL DILATION;  Surgeon: Rogene Houston, MD;  [facility];  Service: Endoscopy;  Laterality: N/A;  130  . ESOPHAGOGASTRODUODENOSCOPY (EGD) WITH PROPOFOL N/A 09/10/2016   Procedure: ESOPHAGOGASTRODUODENOSCOPY (EGD) WITH PROPOFOL;  Surgeon: Rogene Houston, MD;  [facility];  Service: Endoscopy;  Laterality: N/A;  10:20  . EXTRACORPOREAL SHOCK WAVE LITHOTRIPSY  X2   YRS AGO  . HOLMIUM LASER APPLICATION Left [id number]   Procedure: HOLMIUM LASER APPLICATION, LEFT URETER;  Surgeon: Irine Seal, MD;  Location: [facility];  Service: Urology;  Laterality: Left;  . SHOULDER ARTHROSCOPY W/ SUBACROMIAL DECOMPRESSION AND DISTAL CLAVICLE EXCISION Left 2000    Family Psychiatric History:  As below  Family History: No family history on file.  Social History:   Social History   Socioeconomic History  . Marital status: Married    Spouse name: Horris Latino  . Number of children: 3  . Years of education: 18  . Highest education level: Some college, no degree  Occupational History  . Occupation: retired  Scientific laboratory technician  . Financial resource strain: Not hard at all  . Food insecurity    Worry: Never true  Inability: Never true  . Transportation needs    Medical: No    Non-medical: No  Tobacco Use  . Smoking status: Former Smoker    Packs/day: 0.25    Years: 2.00    Pack years: 0.50    Types: Cigarettes    Quit date: 01/14/1969    Years since quitting: 50.4  . Smokeless tobacco: Former Systems developer    Types: [place] date: 01/15/1999  Substance and Sexual Activity  . Alcohol use: Not Currently  . Drug use: No  . Sexual activity: Not Currently    Birth control/protection: None  Lifestyle  . Physical activity    Days per week: 0 days    Minutes per session: 0 min  . Stress: Not at all  Relationships  . Social Herbalist on phone: Three times a week    Gets together: More than three times a week    Attends religious service: More than 4  times per year    Active member of club or organization: No    Attends meetings of clubs or organizations: Never    Relationship status: Married  Other Topics Concern  . Not on file  Social History Narrative  . Not on file    Additional Social History:  He lives with his wife, 3 grown children ("great" relationship) He reports good relationship with her parents Work: retired in 2005, used to work as Furniture conservator/restorer   Allergies:   Allergies  Allergen Reactions  . Benadryl [Diphenhydramine] Other (See Comments)    anxious  . Hctz [Hydrochlorothiazide]     hyponatremia  . Mepergan [Meperidine-Promethazine]     Vomiting.   . Other     Antidepressants--per patient he cannot take.  Marland Kitchen Oxycodone Itching    hyperactivity  . Amoxicillin     Has patient had a PCN reaction causing immediate rash, facial/tongue/throat swelling, SOB or lightheadedness with hypotension:No Has patient had a PCN reaction causing severe rash involving mucus membranes or skin necrosis:No Has patient had a PCN reaction that required hospitalization:No Has patient had a PCN reaction occurring within the last 10 years:Yes If all of the above answers are "NO", then may proceed with Cephalosporin use. "severe anxiety-ativan does not remedy"  . Doxycycline Anxiety  . Flagyl [Metronidazole] Anxiety  . Singulair [Montelukast Sodium] Anxiety    Metabolic Disorder Labs: No results found for: HGBA1C, MPG No results found for: PROLACTIN Lab Results  Component Value Date   CHOL 168 03/04/2017   TRIG 80 03/04/2017   HDL 78 03/04/2017   CHOLHDL 2.2 03/04/2017   LDLCALC 74 03/04/2017   Lab Results  Component Value Date   TSH 1.510 11/20/2018    Therapeutic Level Labs: No results found for: LITHIUM No results found for: CBMZ No results found for: VALPROATE  Current Medications: Current Outpatient Medications  Medication Sig Dispense Refill  . calcium citrate-vitamin D (CITRACAL+D) 315-200 MG-UNIT per tablet  Take 1 tablet by mouth daily.     . Cholecalciferol (VITAMIN D3) 3000 units TABS Take 3,000 Units by mouth daily.    . cimetidine (TAGAMET) 400 MG tablet Take 1 tablet (400 mg total) by mouth daily as needed. (Patient taking differently: Take 200 mg by mouth at bedtime as needed (for heartburn/reflux.). ) 30 tablet 5  . doxazosin (CARDURA) 4 MG tablet Take 4 mg by mouth.     . fish oil-omega-3 fatty acids 1000 MG capsule Take 2 g by mouth daily after breakfast.     .  gabapentin (NEURONTIN) 600 MG tablet TAKE 1 TABLET BY MOUTH THREE TIMES DAILY 90 tablet 2  . hydrOXYzine (VISTARIL) 25 MG capsule Take 1 capsule (25 mg total) by mouth every 8 (eight) hours as needed. 90 capsule 3  . lisinopril (ZESTRIL) 20 MG tablet TAKE 1 TABLET BY MOUTH DAILY 90 tablet 0  . LORazepam (ATIVAN) 1 MG tablet Take 1 tablet (1 mg total) by mouth every 6 (six) hours as needed for anxiety. 120 tablet 1  . Multiple Vitamin (MULTIVITAMIN) tablet Take 1 tablet by mouth daily.    . pantoprazole (PROTONIX) 40 MG tablet TAKE 1 TABLET BY MOUTH EVERY MORNING 30 tablet 11  . rOPINIRole (REQUIP) 0.25 MG tablet Take 3-4 tablets (0.75-1 mg total) by mouth at bedtime. 360 tablet 0  . triamcinolone cream (KENALOG) 0.1 % Apply 1 application topically 2 (two) times daily. x7 days 30 g 0   No current facility-administered medications for this visit.     Musculoskeletal: Strength & Muscle Tone: N/A Gait & Station: N/A Patient leans: N/A  Psychiatric Specialty Exam: Review of Systems  Psychiatric/Behavioral: Positive for memory loss. Negative for depression, hallucinations, substance abuse and suicidal ideas. The patient is nervous/anxious and has insomnia.   All other systems reviewed and are negative.   There were no vitals taken for this visit.There is no height or weight on file to calculate BMI.  General Appearance: Fairly Groomed  Eye Contact:  Fair  Speech:  Clear and Coherent  Volume:  Normal  Mood:  Anxious  Affect:   Appropriate, Congruent and Restricted  Thought Process:  Coherent  Orientation:  Full (Time, Place, and Person)  Thought Content:  Logical  Suicidal Thoughts:  No  Homicidal Thoughts:  No  Memory:  Immediate;   Good  Judgement:  Fair  Insight:  Present  Psychomotor Activity:  Normal  Concentration:  Concentration: Good and Attention Span: Good  Recall:  Good  Fund of Knowledge:Good  Language: Good  Akathisia:  No  Handed:  Right  AIMS (if indicated):  not done  Assets:  Communication Skills Desire for Improvement  ADL's:  Intact  Cognition: WNL  Sleep:  Poor   Screenings: GAD-7     Office Visit from 04/09/2019 in [place] Visit from 11/20/2018 in [place] [facility] Phone Follow Up from 09/04/2018 in [place] [facility] Phone Follow Up from 07/10/2018 in [place]  Total GAD-7 Score  9  4  5  5     Mini-Mental     Clinical Support from 04/17/2018 in [place]  Total Score (max 30 points )  29    PHQ2-9     Office Visit from 06/13/2019 in [place] from 04/20/2019 in [place] Visit from 04/09/2019 in [place] Visit from 11/20/2018 in [place] Visit from 10/18/2018 in [place]  PHQ-2 Total Score  0  0  0  1  3  PHQ-9 Total Score  -  -  0  5  12      Assessment and Plan:  Cameron Atkins is a 73 y.o. year old male with a history of depression, benzodiazepine dependence, restless leg syndrome, iron deficiency anemia, hypertension, GERD, who is referred for depression.   # Benzodiazepine dependence Exam is notable for rumination on getting benzodiazepines, although he is very polite and calm through  the interview.  Discussed the rationale behind tapering down of benzodiazepine to avoid  potential risk of falls, impact on his cognition especially with concomitant use of gabapentin. After having discussion at length, he is amenable to slowly taper down his medication while monitoring withdrawal symptoms.    # Adjustment disorder with anxiety  R/o GAD He is unable to elaborate any triggers of anxiety except that he tends to feel more anxious when he does not take lorazepam. Noted that there is some discrepancy between patient complaints and his wife's concern (particularly about sadness and memory loss).  Will consider antidepressant in the future if indicated.   # Memory loss His wife has concern of his memory loss. Will do further evaluation at his next visit.   Plan [id number]: Decrease lorazepam 1 mg three times a day and 0.5 mg     Week 3-4: Decrease lorazepam 1 mg three time a day (or total of 3 mg per day) 2. Next appointment: 11/12 at 1:30 for 30 mins, video - on gabapentin 600 mg TID  The patient demonstrates the following risk factors for suicide: Chronic risk factors for suicide include: psychiatric disorder of anxiety. Acute risk factors for suicide include: N/A. Protective factors for this patient include: positive social support, coping skills and hope for the future. Considering these factors, the overall suicide risk at this point appears to be low. Patient is appropriate for outpatient follow up.   Norman Clay, MD 9/29/20209:39 AM

## 2019-07-03 ENCOUNTER — Ambulatory Visit (INDEPENDENT_AMBULATORY_CARE_PROVIDER_SITE_OTHER): Payer: Medicare Other | Admitting: Psychiatry

## 2019-07-03 ENCOUNTER — Other Ambulatory Visit: Payer: Self-pay

## 2019-07-03 ENCOUNTER — Encounter (HOSPITAL_COMMUNITY): Payer: Self-pay | Admitting: Psychiatry

## 2019-07-03 DIAGNOSIS — F1320 Sedative, hypnotic or anxiolytic dependence, uncomplicated: Secondary | ICD-10-CM

## 2019-07-03 DIAGNOSIS — F4322 Adjustment disorder with anxiety: Secondary | ICD-10-CM | POA: Diagnosis not present

## 2019-07-03 MED ORDER — LORAZEPAM 1 MG PO TABS: ORAL_TABLET | ORAL | 1 refills | Status: DC

## 2019-07-03 NOTE — Patient Instructions (Addendum)
1.Week1-2: Decrease lorazepam 1 mg three times a day and 0.5 mg      Week 3-4: Decrease lorazepam 1 mg three time a day (or total of 3 mg per day) 2. Next appointment: 11/12 at 1:30

## 2019-07-17 ENCOUNTER — Ambulatory Visit: Payer: Medicare Other | Admitting: Licensed Clinical Social Worker

## 2019-07-17 ENCOUNTER — Telehealth (HOSPITAL_COMMUNITY): Payer: Self-pay | Admitting: *Deleted

## 2019-07-17 ENCOUNTER — Ambulatory Visit (INDEPENDENT_AMBULATORY_CARE_PROVIDER_SITE_OTHER): Payer: Medicare Other | Admitting: *Deleted

## 2019-07-17 DIAGNOSIS — F339 Major depressive disorder, recurrent, unspecified: Secondary | ICD-10-CM

## 2019-07-17 DIAGNOSIS — G2581 Restless legs syndrome: Secondary | ICD-10-CM

## 2019-07-17 DIAGNOSIS — F419 Anxiety disorder, unspecified: Secondary | ICD-10-CM

## 2019-07-17 DIAGNOSIS — D649 Anemia, unspecified: Secondary | ICD-10-CM | POA: Diagnosis not present

## 2019-07-17 DIAGNOSIS — I10 Essential (primary) hypertension: Secondary | ICD-10-CM

## 2019-07-17 DIAGNOSIS — F1320 Sedative, hypnotic or anxiolytic dependence, uncomplicated: Secondary | ICD-10-CM

## 2019-07-17 NOTE — Telephone Encounter (Signed)
Noted. Hopefully the routine follow up with SW/therapy to be helpful for him in the midst of this medication adjustment. No change in plans at this time unless there are any significant signs to be concerned of withdrawal symptoms.

## 2019-07-17 NOTE — Chronic Care Management (AMB) (Signed)
Care Management Note   Cameron Atkins is a 73 y.o. year old male who is a primary care patient of Cameron Norlander, DO. The CM team was consulted for assistance with chronic disease management and care coordination.   I reached out to Cameron Atkins by phone today.    Review of patient status, including review of consultants reports, relevant laboratory and other test results, and collaboration with appropriate care team members and the patient's provider was performed as part of comprehensive patient evaluation and provision of chronic care management services.   Social Determinants of Health: risk of social isolation; risk of tobacco use; risk of physical inactivity    Office Visit from 04/09/2019 in [place]  PHQ-9 Total Score  0     GAD 7 : Generalized Anxiety Score 04/09/2019 11/20/2018 09/04/2018 07/10/2018  Nervous, Anxious, on Edge 3 1 1 1   Control/stop worrying 1 1 1 1   Worry too much - different things 1 0 1 1  Trouble relaxing 2 1 1 1   Restless 1 0 0 0  Easily annoyed or irritable 1 1 0 0  Afraid - awful might happen 0 0 1 1  Total GAD 7 Score 9 4 5 5   Anxiety Difficulty - Somewhat difficult Somewhat difficult Somewhat difficult   Medications   New medications from outside sources are available for reconciliation   calcium citrate-vitamin D (CITRACAL+D) 315-200 MG-UNIT per tablet    Cholecalciferol (VITAMIN D3) 3000 units TABS    cimetidine (TAGAMET) 400 MG tablet    doxazosin (CARDURA) 4 MG tablet    fish oil-omega-3 fatty acids 1000 MG capsule    gabapentin (NEURONTIN) 600 MG tablet    hydrOXYzine (VISTARIL) 25 MG capsule    lisinopril (ZESTRIL) 20 MG tablet    LORazepam (ATIVAN) 1 MG tablet    LORazepam (ATIVAN) 1 MG tablet    Multiple Vitamin (MULTIVITAMIN) tablet    pantoprazole (PROTONIX) 40 MG tablet    rOPINIRole (REQUIP) 0.25 MG tablet    triamcinolone cream (KENALOG) 0.1 %     Goals        . "I need help in managing  anxiety" (pt-stated)     Current Barriers:  Marland Kitchen Mental Health Concerns (Anxiety Disorder)  Clinical Social Work Clinical Goal(s): Over the next 30 days, client will work with LCSW to address concerns related to management of anxiety symptoms of client  Interventions: . LCSW talked with client about management of anxiety symptoms of client and approaches for managing anxiety symptoms.  Marland Kitchen LCSW talked with client about support he receives from his spouse.  . Encouraged client to talk with RNCM to discuss nursing needs of client . Talked with client about his recent appointment with psychiatrist, Dr. Modesta Atkins.  . Talked with client about sleep issues of client (said he sometimes wakes up 2-3 times a week at night and is sweating on his chest) . Collaborated with RNCM regarding client needs at present  Patient Self Care Activities:  . Self administers medications as prescribed. . Attends all scheduled provider appointments . Talks openly about anxiety symptoms management  Plan:  . LCSW to call client in next 3 weeks to further discuss management of anxiety symptoms of client. . Client to call LCSW as needed to discuss psychosocial needs of client.  .  Client to use relaxation techniques of choice to help manage anxiety symptoms . Client to communicate with RNCM as needed to discuss nursing needs of client  *initial  goal documentation           Follow Up Plan: LCSW to call client in next 3 weeks to further discuss management of anxiety symptoms of client  Cameron Atkins.Cameron Atkins MSW, LCSW Licensed Clinical Social Worker [facility] Care Management [phone]

## 2019-07-17 NOTE — Patient Instructions (Addendum)
Licensed Clinical Social Worker Visit Information  Goals we discussed today:  Goals        . "I need help in managing anxiety" (pt-stated)     Current Barriers:  Marland Kitchen Mental Health Concerns (Anxiety Disorder)  Clinical Social Work Clinical Goal(s): Over the next 30 days, client will work with LCSW to address concerns related to management of anxiety symptoms of client  Interventions:  LCSW talked with client about management of anxiety symptoms of client and approaches for managing anxiety symptoms.    Encouraged client to talk with RNCM to discuss nursing needs of client   LCSW talked with client about support he receives from his spouse.   Talked with client about his recent appointment with psychiatrist, Dr. Modesta Messing.   Talked with client about sleep issues of client (said he sometimes wakes up 2-3 times a week at night and is sweating on his chest)  Collaborated with RNCM regarding client needs at present  Patient Self Care Activities:  . Self administers medications as prescribed. . Attends all scheduled provider appointments . Talks openly about anxiety symptoms management  Plan:  . LCSW to call client in next 3 weeks to further discuss management of anxiety symptoms of client. . Client to call LCSW as needed to discuss psychosocial needs of client.  .  Client to use relaxation techniques of choice to help manage anxiety symptoms . Client to communicate with RNCM as needed to discuss nursing needs of client  *initial goal documentation        Materials Provided: No  Follow Up Plan: LCSW to call client in next 3 weeks to further discuss management of anxiety symptoms of client  The patient verbalized understanding of instructions provided today and declined a print copy of patient instruction materials.   Norva Riffle.Katalea Ucci MSW, LCSW Licensed Clinical Social Worker [facility] Care Management [phone]

## 2019-07-17 NOTE — Telephone Encounter (Signed)
The [place] social worker  From the PCP office Carlyle Lipa # [phone]  called to inform that he's been with the patient for 8 months & patient mentioned & he has noticed some side effects since med adjustment : very irritable, very angry, not sleeping well (wakes up several times thru the night with bad night sweats [requiring changing T-shirt that is slept in] ). Chest tightness ( more of a muscle tightness) holding off taking Lorazepam until @ night due to prior mentioned.

## 2019-07-17 NOTE — Patient Instructions (Signed)
  Goals Addressed            This Visit's Progress     Patient Stated   . Immunizations (pt-stated)       Current Barriers:  Marland Kitchen Knowledge Deficits related to immunization schedule  Nurse Case Manager Clinical Goal(s):  Marland Kitchen Over the next 30 days, patient will meet with RN Care Manager to address overdue immunizations: Flu, Tdap, Prevnar/pneumovax  Interventions:  . Immunization record reviewed in [facility] and [facility] o No documented Tdap or pneumonia vaccine o Received high dose flu last year . Offered to setup flu vaccine appt. Patient will have it done at his pharmacy [facility]. Requested that they send a copy to [facility]. . I will request his paper chart for review . Recommended Prevnar and Tdap if he hasn't had them . Immunization recommendations reviewed. All questions answered.   Patient Self Care Activities:  . Performs ADL's independently . Performs IADL's independently . Unable to independently n/a  Initial goal documentation     . Iron Deficiency Anemia (pt-stated)       Current Barriers:  . Non-adherence to scheduled provider appointments . Chronic Disease Management support and education needs related to iron deficiency anemia  Nurse Case Manager Clinical Goal(s):  Marland Kitchen Over the next 15 days, patient will verbalize understanding of plan for anemia . Over the next 30 days, patient will attend all scheduled medical appointments: Pending lab appointment for repeat hgb and ferritin level at GI office  Interventions:  . Evaluation of current treatment plan related to iron deficiency anemia and patient's adherence to plan as established by provider. . Advised patient to expect a telephone call from Cori Razor office regarding lab test scheduling . Provided education to patient re: anemia, potential causes and treatments . Discussed plans with patient for ongoing care management follow up and provided patient with direct contact information for care management team . Will  collaborate with GI office to arrange a time for Mr Linney to come in for overdue lab tests . Reviewed relevant office notes and lab results and discussed results with patient  Patient Self Care Activities:  . Performs ADL's independently . Performs IADL's independently . Unable to independently n/a  Initial goal documentation        Follow-up Plan  RN CCM will f/u with patient over the next 7 days regarding vaccinations  Patient provided with CCM team contact information and will reach out as needed  Patient should f/u with GI regarding overdue lab tests   Chong Sicilian, BSN, RN-BC [place] [facility] [place] Management Direct Dial: [phone]

## 2019-07-17 NOTE — Chronic Care Management (AMB) (Signed)
Chronic Care Management   Follow Up Note   07/17/2019 Name: Cameron Atkins MRN: [id number] DOB: 1946-06-13  Referred by: Cameron Norlander, DO Reason for referral : Chronic Care Management (CCM follow up)   Cameron Atkins is a 73 y.o. year old male who is a primary care patient of Cameron Norlander, DO. The CCM team was consulted for assistance with chronic disease management and care coordination needs.    I spoke with Cameron Atkins by telephone today after being consulted by Cameron Nan, LCSW who spoke with Cameron Atkins earlier this morning. His primary concern is that Cameron Cameron Atkins and Cameron Cameron Atkins are tapering down his Ativan dose. He talked with Cameron Atkins about this at length earlier and he returned to the topic multiple times during our conversation. Cameron Atkins did call Cameron Atkins office to relay Cameron Atkins concerns as I recommended and I advised Cameron Atkins that I would expect Cameron Atkins office to reach out to him to discuss his concerns.   I talked with Cameron Atkins about his vaccination history and iron deficiency anemia.   Outpatient Encounter Medications as of 07/17/2019  Medication Sig  . calcium citrate-vitamin D (CITRACAL+D) 315-200 MG-UNIT per tablet Take 1 tablet by mouth daily.   . Cholecalciferol (VITAMIN D3) 3000 units TABS Take 3,000 Units by mouth daily.  . cimetidine (TAGAMET) 400 MG tablet Take 1 tablet (400 mg total) by mouth daily as needed. (Patient taking differently: Take 200 mg by mouth at bedtime as needed (for heartburn/reflux.). )  . doxazosin (CARDURA) 4 MG tablet Take 4 mg by mouth.   . fish oil-omega-3 fatty acids 1000 MG capsule Take 2 g by mouth daily after breakfast.   . gabapentin (NEURONTIN) 600 MG tablet TAKE 1 TABLET BY MOUTH THREE TIMES DAILY  . hydrOXYzine (VISTARIL) 25 MG capsule Take 1 capsule (25 mg total) by mouth every 8 (eight) hours as needed. (Patient not taking: Reported on 07/03/2019)  . lisinopril (ZESTRIL) 20 MG tablet TAKE 1 TABLET BY MOUTH DAILY  . LORazepam  (ATIVAN) 1 MG tablet Take 1 tablet (1 mg total) by mouth every 6 (six) hours as needed for anxiety.  Marland Kitchen LORazepam (ATIVAN) 1 MG tablet Week 1, 2: 1 mg three times a day and 0.5 mg daily, Week 3-4: 1 mg three times a day  . Multiple Vitamin (MULTIVITAMIN) tablet Take 1 tablet by mouth daily.  . pantoprazole (PROTONIX) 40 MG tablet TAKE 1 TABLET BY MOUTH EVERY MORNING  . rOPINIRole (REQUIP) 0.25 MG tablet Take 3-4 tablets (0.75-1 mg total) by mouth at bedtime.  . triamcinolone cream (KENALOG) 0.1 % Apply 1 application topically 2 (two) times daily. x7 days   No facility-administered encounter medications on file as of 07/17/2019.      Goals Addressed            This Visit's Progress     Patient Stated   . Immunizations (pt-stated)       Current Barriers:  Marland Kitchen Knowledge Deficits related to immunization schedule  Nurse Case Manager Clinical Goal(s):  Marland Kitchen Over the next 30 days, patient will meet with RN Care Manager to address overdue immunizations: Flu, Tdap, Prevnar/pneumovax  Interventions:  . Immunization record reviewed in [facility] and [facility] o No documented Tdap or pneumonia vaccine o Received high dose flu last year . Offered to setup flu vaccine appt. Patient will have it done at his pharmacy [facility]. Requested that they send a copy to [facility]. . I will request his  paper chart for review . Recommended Prevnar and Tdap if he hasn't had them . Immunization recommendations reviewed. All questions answered.   Patient Self Care Activities:  . Performs ADL's independently . Performs IADL's independently . Unable to independently n/a  Initial goal documentation     . Iron Deficiency Anemia (pt-stated)       Current Barriers:  . Non-adherence to scheduled provider appointments . Chronic Disease Management support and education needs related to iron deficiency anemia  Nurse Case Manager Clinical Goal(s):  Marland Kitchen Over the next 15 days, patient will verbalize understanding of plan for anemia .  Over the next 30 days, patient will attend all scheduled medical appointments: Pending lab appointment for repeat hgb and ferritin level at GI office  Interventions:  . Evaluation of current treatment plan related to iron deficiency anemia and patient's adherence to plan as established by provider. . Advised patient to expect a telephone call from Cori Razor office regarding lab test scheduling . Provided education to patient re: anemia, potential causes and treatments . Discussed plans with patient for ongoing care management follow up and provided patient with direct contact information for care management team . Will collaborate with GI office to arrange a time for Cameron Atkins to come in for overdue lab tests . Reviewed relevant office notes and lab results and discussed results with patient  Patient Self Care Activities:  . Performs ADL's independently . Performs IADL's independently . Unable to independently n/a  Initial goal documentation        Follow-up Plan  RN CCM will f/u with patient over the next 7 days regarding vaccinations  Patient provided with CCM team contact information and will reach out as needed  Patient should f/u with GI regarding overdue lab tests   Cameron Atkins, BSN, RN-BC [place] [facility] [place] [facility] Direct Dial: [phone]

## 2019-07-23 ENCOUNTER — Ambulatory Visit: Payer: Medicare Other | Admitting: *Deleted

## 2019-07-25 DIAGNOSIS — D225 Melanocytic nevi of trunk: Secondary | ICD-10-CM | POA: Diagnosis not present

## 2019-07-25 DIAGNOSIS — L821 Other seborrheic keratosis: Secondary | ICD-10-CM | POA: Diagnosis not present

## 2019-07-25 DIAGNOSIS — L570 Actinic keratosis: Secondary | ICD-10-CM | POA: Diagnosis not present

## 2019-07-25 DIAGNOSIS — D1801 Hemangioma of skin and subcutaneous tissue: Secondary | ICD-10-CM | POA: Diagnosis not present

## 2019-07-25 DIAGNOSIS — Z85828 Personal history of other malignant neoplasm of skin: Secondary | ICD-10-CM | POA: Diagnosis not present

## 2019-07-25 DIAGNOSIS — L814 Other melanin hyperpigmentation: Secondary | ICD-10-CM | POA: Diagnosis not present

## 2019-07-25 DIAGNOSIS — L304 Erythema intertrigo: Secondary | ICD-10-CM | POA: Diagnosis not present

## 2019-07-25 DIAGNOSIS — B353 Tinea pedis: Secondary | ICD-10-CM | POA: Diagnosis not present

## 2019-07-30 DIAGNOSIS — R1030 Lower abdominal pain, unspecified: Secondary | ICD-10-CM | POA: Diagnosis not present

## 2019-07-30 DIAGNOSIS — N200 Calculus of kidney: Secondary | ICD-10-CM | POA: Diagnosis not present

## 2019-07-30 DIAGNOSIS — R3912 Poor urinary stream: Secondary | ICD-10-CM | POA: Diagnosis not present

## 2019-07-30 DIAGNOSIS — N401 Enlarged prostate with lower urinary tract symptoms: Secondary | ICD-10-CM | POA: Diagnosis not present

## 2019-07-31 DIAGNOSIS — N401 Enlarged prostate with lower urinary tract symptoms: Secondary | ICD-10-CM | POA: Diagnosis not present

## 2019-08-01 NOTE — Progress Notes (Signed)
Virtual Visit via Video Note  I connected with Cameron Atkins on 08/09/19 at  1:30 PM EST by a video enabled telemedicine application and verified that I am speaking with the correct person using two identifiers.   I discussed the limitations of evaluation and management by telemedicine and the availability of in person appointments. The patient expressed understanding and agreed to proceed.      I discussed the assessment and treatment plan with the patient. The patient was provided an opportunity to ask questions and all were answered. The patient agreed with the plan and demonstrated an understanding of the instructions.   The patient was advised to call back or seek an in-person evaluation if the symptoms worsen or if the condition fails to improve as anticipated.  I provided 40 minutes of non-face-to-face time during this encounter.   Cameron Clay, MD     Cameron Atkins [facility] MD/PA/NP OP Progress Note  08/09/2019 2:04 PM Cameron Atkins  MRN:  [id number]  Chief Complaint:  Chief Complaint    Follow-up; Anxiety     HPI:  This is a follow-up appointment for benzodiazepine dependence.  He states that he has struggled to reduce the dose of Ativan.  He takes the first dose 5-7 pm as it is easier for him to get by during the day without taking ativan. He states that he notices occasional chest tightness ("like asthma"), which improves after taking lorazepam. He is able to not pay attention to it as much during the day. He feels sad at times, although there are "nothing to be sad about." He denies any stress except that he was frustrated at the previous visit to taper down lorazepam. He continues that he has been on lorazepam for 35 years without any problems. He also talks about his friend/pharmacist, who does not think the patient continues to stay on lorazepam. He states that he had dizziness when he climbed up the room, blowing off leaves. He felt better after taking lorazepam. He denies insomnia  after starting gabapentin. He denies irritability.  He takes a walk outside, and mows during the day. He enjoys reading and watches TV. He enjoyed a beach trip with his wife and her friend, although he came back sooner by himself.  He denies panic attacks. He occasionally feels anxious and tense. He denies SI. He reports memory loss; difficulty in remembering people's names. IADL/ADL as follows.   Functional Status Instrumental Activities of Daily Living (IADLs):  [name] is independent in the following: managing finances, medications, driving  Activities of Daily Living (ADLs):  Cameron Atkins is independent in the following: bathing and hygiene, feeding, continence, grooming and toileting, walking   Visit Diagnosis:    ICD-10-CM   1. Benzodiazepine dependence ([place])  F13.20     Past Psychiatric History: Please see initial evaluation for full details. I have reviewed the history. No updates at this time.     Past Medical History:  Past Medical History:  Diagnosis Date  . Anxiety   . Arthritis   . Bladder stone   . Depression   . Diverticulosis of colon   . GERD (gastroesophageal reflux disease)   . H/O hiatal hernia   . Helicobacter pylori gastritis   . History of colonoscopy with polypectomy    TUBULAR ADENOMA-- 2012  . History of esophageal dilatation    STRICTURE--  LAST DONE 03/2013  . Hypertension    for 5 yrs  . IDA (iron deficiency anemia) 11/22/2018  .  Left ureteral calculus   . Osteoporosis   . Wears glasses     Past Surgical History:  Procedure Laterality Date  . BIOPSY  09/10/2016   Procedure: BIOPSY;  Surgeon: Rogene Houston, MD;  [facility];  Service: Endoscopy;;  rectal polyp  . COLONOSCOPY WITH PROPOFOL N/A 09/10/2016   Procedure: COLONOSCOPY WITH PROPOFOL;  Surgeon: Rogene Houston, MD;  [facility];  Service: Endoscopy;  Laterality: N/A;  . CYSTO/  URETEROSCOPIC LITHOTRIPSY/ STONE EXTRACTION  1995  . CYSTOSCOPY W/  RETROGRADES Right 01/15/2014   Procedure: CYSTOSCOPY WITH RIGHT RETROGRADE PYELOGRAM;  Surgeon: Irine Seal, MD;  Location: [facility];  Service: Urology;  Laterality: Right;  . CYSTOSCOPY WITH RETROGRADE PYELOGRAM, URETEROSCOPY AND STENT PLACEMENT Left 01/15/2014   Procedure: CYSTOSCOPY , LEFT RETROGRADE PYELOGRAM, LEFT URETEROSCOPY, BASKET STONE EXTRACTION;  Surgeon: Irine Seal, MD;  [facility];  Service: Urology;  Laterality: Left;  . ESOPHAGEAL DILATION  03/21/2013   Procedure: ESOPHAGEAL DILATION;  Surgeon: Danie Binder, MD;  [facility];  Service: Endoscopy;;  . ESOPHAGEAL DILATION N/A 09/10/2016   Procedure: ESOPHAGEAL DILATION;  Surgeon: Rogene Houston, MD;  [facility];  Service: Endoscopy;  Laterality: N/A;  . ESOPHAGOGASTRODUODENOSCOPY N/A 03/21/2013   Procedure: ESOPHAGOGASTRODUODENOSCOPY (EGD);  Surgeon: Danie Binder, MD;  [facility];  Service: Endoscopy;  Laterality: N/A;  . ESOPHAGOGASTRODUODENOSCOPY (EGD) WITH ESOPHAGEAL DILATION N/A 04/26/2013   Procedure: ESOPHAGOGASTRODUODENOSCOPY (EGD) WITH ESOPHAGEAL DILATION;  Surgeon: Rogene Houston, MD;  [facility];  Service: Endoscopy;  Laterality: N/A;  130  . ESOPHAGOGASTRODUODENOSCOPY (EGD) WITH PROPOFOL N/A 09/10/2016   Procedure: ESOPHAGOGASTRODUODENOSCOPY (EGD) WITH PROPOFOL;  Surgeon: Rogene Houston, MD;  [facility];  Service: Endoscopy;  Laterality: N/A;  10:20  . EXTRACORPOREAL SHOCK WAVE LITHOTRIPSY  X2   YRS AGO  . HOLMIUM LASER APPLICATION Left [id number]   Procedure: HOLMIUM LASER APPLICATION, LEFT URETER;  Surgeon: Irine Seal, MD;  Location: [facility];  Service: Urology;  Laterality: Left;  . SHOULDER ARTHROSCOPY W/ SUBACROMIAL DECOMPRESSION AND DISTAL CLAVICLE EXCISION Left 2000    Family Psychiatric History: Please see initial evaluation for full details. I have reviewed the history. No updates at this  time.     Family History:  Family History  Problem Relation Age of Onset  . Anxiety disorder Father   . Anxiety disorder Son   . Anxiety disorder Daughter     Social History:  Social History   Socioeconomic History  . Marital status: Married    Spouse name: Horris Latino  . Number of children: 3  . Years of education: 30  . Highest education level: Some college, no degree  Occupational History  . Occupation: retired  Scientific laboratory technician  . Financial resource strain: Not hard at all  . Food insecurity    Worry: Never true    Inability: Never true  . Transportation needs    Medical: No    Non-medical: No  Tobacco Use  . Smoking status: Former Smoker    Packs/day: 0.25    Years: 2.00    Pack years: 0.50    Types: Cigarettes    Quit date: 01/14/1969    Years since quitting: 50.6  . Smokeless tobacco: Former Systems developer    Types: [place] date: 01/15/1999  Substance and Sexual Activity  . Alcohol use: Not Currently  . Drug use: No  . Sexual  activity: Not Currently    Birth control/protection: None  Lifestyle  . Physical activity    Days per week: 0 days    Minutes per session: 0 min  . Stress: Not at all  Relationships  . Social Herbalist on phone: Three times a week    Gets together: More than three times a week    Attends religious service: More than 4 times per year    Active member of club or organization: No    Attends meetings of clubs or organizations: Never    Relationship status: Married  Other Topics Concern  . Not on file  Social History Narrative  . Not on file    Allergies:  Allergies  Allergen Reactions  . Benadryl [Diphenhydramine] Other (See Comments)    anxious  . Hctz [Hydrochlorothiazide]     hyponatremia  . Mepergan [Meperidine-Promethazine]     Vomiting.   . Other     Antidepressants--per patient he cannot take.  Marland Kitchen Oxycodone Itching    hyperactivity  . Amoxicillin     Has patient had a PCN reaction causing immediate rash,  facial/tongue/throat swelling, SOB or lightheadedness with hypotension:No Has patient had a PCN reaction causing severe rash involving mucus membranes or skin necrosis:No Has patient had a PCN reaction that required hospitalization:No Has patient had a PCN reaction occurring within the last 10 years:Yes If all of the above answers are "NO", then may proceed with Cephalosporin use. "severe anxiety-ativan does not remedy"  . Doxycycline Anxiety  . Flagyl [Metronidazole] Anxiety  . Singulair [Montelukast Sodium] Anxiety    Metabolic Disorder Labs: No results found for: HGBA1C, MPG No results found for: PROLACTIN Lab Results  Component Value Date   CHOL 168 03/04/2017   TRIG 80 03/04/2017   HDL 78 03/04/2017   CHOLHDL 2.2 03/04/2017   LDLCALC 74 03/04/2017   Lab Results  Component Value Date   TSH 1.510 11/20/2018   TSH 1.390 02/28/2018    Therapeutic Level Labs: No results found for: LITHIUM No results found for: VALPROATE No components found for:  CBMZ  Current Medications: Current Outpatient Medications  Medication Sig Dispense Refill  . calcium citrate-vitamin D (CITRACAL+D) 315-200 MG-UNIT per tablet Take 1 tablet by mouth daily.     . Cholecalciferol (VITAMIN D3) 3000 units TABS Take 3,000 Units by mouth daily.    . cimetidine (TAGAMET) 400 MG tablet Take 1 tablet (400 mg total) by mouth daily as needed. (Patient taking differently: Take 200 mg by mouth at bedtime as needed (for heartburn/reflux.). ) 30 tablet 5  . doxazosin (CARDURA) 4 MG tablet Take 4 mg by mouth.     . fish oil-omega-3 fatty acids 1000 MG capsule Take 2 g by mouth daily after breakfast.     . gabapentin (NEURONTIN) 600 MG tablet TAKE 1 TABLET BY MOUTH THREE TIMES DAILY 90 tablet 2  . hydrOXYzine (VISTARIL) 25 MG capsule Take 1 capsule (25 mg total) by mouth every 8 (eight) hours as needed. (Patient not taking: Reported on 07/03/2019) 90 capsule 3  . lisinopril (ZESTRIL) 20 MG tablet TAKE 1 TABLET BY  MOUTH DAILY 90 tablet 0  . LORazepam (ATIVAN) 1 MG tablet Take 1 tablet (1 mg total) by mouth every 6 (six) hours as needed for anxiety. 120 tablet 1  . LORazepam (ATIVAN) 1 MG tablet Take 1 tablet (1 mg total) by mouth 3 (three) times daily as needed for anxiety. 90 tablet 0  . Multiple Vitamin (MULTIVITAMIN)  tablet Take 1 tablet by mouth daily.    . pantoprazole (PROTONIX) 40 MG tablet TAKE 1 TABLET BY MOUTH EVERY MORNING 30 tablet 11  . rOPINIRole (REQUIP) 0.25 MG tablet Take 3-4 tablets (0.75-1 mg total) by mouth at bedtime. 360 tablet 0  . triamcinolone cream (KENALOG) 0.1 % Apply 1 application topically 2 (two) times daily. x7 days 30 g 0   No current facility-administered medications for this visit.      Musculoskeletal: Strength & Muscle Tone: N/A Gait & Station: N/A Patient leans: N/A  Psychiatric Specialty Exam: Review of Systems  Psychiatric/Behavioral: Positive for memory loss. Negative for depression, hallucinations, substance abuse and suicidal ideas. The patient is nervous/anxious. The patient does not have insomnia.   All other systems reviewed and are negative.   There were no vitals taken for this visit.There is no height or weight on file to calculate BMI.  General Appearance: Fairly Groomed  Eye Contact:  Good  Speech:  Clear and Coherent  Volume:  Normal  Mood:  "fine"  Affect:  Appropriate, Congruent and euthymic  Thought Process:  Coherent  Orientation:  Full (Time, Place, and Person)  Thought Content: Logical   Suicidal Thoughts:  No  Homicidal Thoughts:  No  Memory:  Immediate;   Good  Judgement:  Good  Insight:  Fair  Psychomotor Activity:  Normal  Concentration:  Concentration: Good and Attention Span: Good  Recall:  Good  Fund of Knowledge: Good  Language: Good  Akathisia:  No  Handed:  Right  AIMS (if indicated): not done  Assets:  Communication Skills Desire for Improvement  ADL's:  Intact  Cognition: WNL  Sleep:  Good    Screenings: GAD-7     Office Visit from 04/09/2019 in [place] Visit from 11/20/2018 in [place] Phone Follow Up from 09/04/2018 in [place] Phone Follow Up from 07/10/2018 in [place]  Total GAD-7 Score  9  4  5  5     Mini-Mental     Clinical Support from 04/17/2018 in [place]  Total Score (max 30 points )  29    PHQ2-9     Office Visit from 06/13/2019 in [place] from 04/20/2019 in [place] Visit from 04/09/2019 in [place] Visit from 11/20/2018 in [place] Visit from 10/18/2018 in [place]  PHQ-2 Total Score  0  0  0  1  3  PHQ-9 Total Score  -  -  0  5  12       Assessment and Plan:  Cameron Atkins is a 72 y.o. year old male with a history of depression, benzodiazepine dependence, restless leg syndrome, iron deficiency anemia, hypertension, GERD, , who presents for follow up appointment for Benzodiazepine dependence ([place])  # Benzodiazepine dependence He has been successfully taper down lorazepam without significant withdrawal symptoms while he continues to report chest tightness. Discussed at length regarding the risk of chronic use of benzodiazepine; including but not limited to cognitive impairment, and fall especially in geriatric population with concomitant gabapentin use. He is not amenable to reduce the dose anymore. He reports his preference to be transferred to other provider. Will provide a month refill to allow him to have a new provider. He is advised to discuss with his PCP about this plan.   # Adjustment  disorder with anxiety  R/o GAD He reports good benefit from gabapentin for anxiety, prescribed by other provider. He may benefit from antidepressant if any  worsening in his anxiety.   # Memory loss He reports occasional memory loss. ADL/IADL full. Discussed the potential risk contributing to memory loss as documented above.   Plan 1. Continue lorazepam 1 mg, 1 mg and 1 mg  2. He reports his preference to be transferred to other provider. He will discuss this with his PCP (He agrees that this examiner will NOT prescribe anymore lorazepam given he is not amenable to the recommendation to taper down lorazepam) - on gabapentin 600 mg TID  The patient demonstrates the following risk factors for suicide: Chronic risk factors for suicide include: psychiatric disorder of anxiety. Acute risk factors for suicide include: N/A. Protective factors for this patient include: positive social support, coping skills and hope for the future. Considering these factors, the overall suicide risk at this point appears to be low. Patient is appropriate for outpatient follow up.  The duration of this appointment visit was 40 minutes of non face-to-face time with the patient.  Greater than 50% of this time was spent in counseling, explanation of  diagnosis, planning of further management, and coordination of care.  Cameron Clay, MD 08/09/2019, 2:04 PM

## 2019-08-02 DIAGNOSIS — N5201 Erectile dysfunction due to arterial insufficiency: Secondary | ICD-10-CM | POA: Diagnosis not present

## 2019-08-02 DIAGNOSIS — R3912 Poor urinary stream: Secondary | ICD-10-CM | POA: Diagnosis not present

## 2019-08-02 DIAGNOSIS — R6882 Decreased libido: Secondary | ICD-10-CM | POA: Diagnosis not present

## 2019-08-02 DIAGNOSIS — N401 Enlarged prostate with lower urinary tract symptoms: Secondary | ICD-10-CM | POA: Diagnosis not present

## 2019-08-07 ENCOUNTER — Ambulatory Visit (INDEPENDENT_AMBULATORY_CARE_PROVIDER_SITE_OTHER): Payer: Medicare Other | Admitting: Licensed Clinical Social Worker

## 2019-08-07 DIAGNOSIS — F339 Major depressive disorder, recurrent, unspecified: Secondary | ICD-10-CM

## 2019-08-07 DIAGNOSIS — D649 Anemia, unspecified: Secondary | ICD-10-CM

## 2019-08-07 DIAGNOSIS — F1320 Sedative, hypnotic or anxiolytic dependence, uncomplicated: Secondary | ICD-10-CM

## 2019-08-07 DIAGNOSIS — I10 Essential (primary) hypertension: Secondary | ICD-10-CM | POA: Diagnosis not present

## 2019-08-07 DIAGNOSIS — G2581 Restless legs syndrome: Secondary | ICD-10-CM

## 2019-08-07 DIAGNOSIS — F419 Anxiety disorder, unspecified: Secondary | ICD-10-CM

## 2019-08-07 NOTE — Chronic Care Management (AMB) (Signed)
Care Management Note   Cameron Atkins is a 73 y.o. year old male who is a primary care patient of Janora Norlander, DO. The CM team was consulted for assistance with chronic disease management and care coordination.   I reached out to [name] by phone today.    Review of patient status, including review of consultants reports, relevant laboratory and other test results, and collaboration with appropriate care team members and the patient's provider was performed as part of comprehensive patient evaluation and provision of chronic care management services.   Social determinants of health: risk of social isolation; risk of tobacco use; risk of physical inactivity.     Office Visit from 04/09/2019 in [place]  PHQ-9 Total Score  0     GAD 7 : Generalized Anxiety Score 04/09/2019 11/20/2018 09/04/2018 07/10/2018  Nervous, Anxious, on Edge 3 1 1 1   Control/stop worrying 1 1 1 1   Worry too much - different things 1 0 1 1  Trouble relaxing 2 1 1 1   Restless 1 0 0 0  Easily annoyed or irritable 1 1 0 0  Afraid - awful might happen 0 0 1 1  Total GAD 7 Score 9 4 5 5   Anxiety Difficulty - Somewhat difficult Somewhat difficult Somewhat difficult   Medications   New medications from outside sources are available for reconciliation   calcium citrate-vitamin D (CITRACAL+D) 315-200 MG-UNIT per tablet    Cholecalciferol (VITAMIN D3) 3000 units TABS    cimetidine (TAGAMET) 400 MG tablet    doxazosin (CARDURA) 4 MG tablet    fish oil-omega-3 fatty acids 1000 MG capsule    gabapentin (NEURONTIN) 600 MG tablet    hydrOXYzine (VISTARIL) 25 MG capsule    lisinopril (ZESTRIL) 20 MG tablet    LORazepam (ATIVAN) 1 MG tablet    LORazepam (ATIVAN) 1 MG tablet    Multiple Vitamin (MULTIVITAMIN) tablet    pantoprazole (PROTONIX) 40 MG tablet    rOPINIRole (REQUIP) 0.25 MG tablet    triamcinolone cream (KENALOG) 0.1 %     Goals    . "I need help in managing anxiety"  (pt-stated)     Current Barriers:  Marland Kitchen Mental Health Concerns (Anxiety Disorder)  Clinical Social Work Clinical Goal(s): Over the next 30 days, client will work with LCSW to address concerns related to management of anxiety symptoms of client  Interventions:   LCSW talked with client previously about management of anxiety symptoms of client and approaches for managing anxiety symptoms.   LCSW talked previously with client about support he receives from his spouse.   Previously collaborated with [facility] regarding client needs at present . Talked with client about his upcoming phone call appointment on Thursday of this week with Dr. Modesta Messing, psychiatrist  Patient Self Care Activities:  . Self administers medications as prescribed. . Attends all scheduled provider appointments . Talks openly about anxiety symptoms management  Plan:  . LCSW to call client in next 3 weeks to further discuss management of anxiety symptoms of client. . Client to call LCSW as needed to discuss psychosocial needs of client.  .  Client to use relaxation techniques of choice to help manage anxiety symptoms . Client to communicate with RNCM as needed to discuss nursing needs of client  *initial goal documentation     Follow Up Plan:  LCSW to call client in 3 weeks to further discuss management of anxiety symptoms of client  Norva Riffle.Ivanell Deshotel MSW, [employer] Licensed  Clinical Social Worker [facility] Care Management [phone] 

## 2019-08-07 NOTE — Patient Instructions (Addendum)
Licensed Clinical Social Worker Visit Information  Goals we discussed today:  Goals        . "I need help in managing anxiety" (pt-stated)     Current Barriers:  Marland Kitchen Mental Health Concerns (Anxiety Disorder)  Clinical Social Work Clinical Goal(s): Over the next 30 days, client will work with LCSW to address concerns related to management of anxiety symptoms of client  Interventions: . LCSW talked with client previously about management of anxiety symptoms of client and approaches for managing anxiety symptoms.  Marland Kitchen LCSW talked with client previously about social work needs of client . Previously LCSW talked with client about relaxation techniques of choice to help client manage anxiety symptoms . Encouraged client previously to talk with RNCM to discuss nursing needs of client  Talked with client about his upcoming phone call appointment on Thursday of this week with Dr. Modesta Messing, psychiatrist   Patient Self Care Activities:  . Self administers medications as prescribed. . Attends all scheduled provider appointments . Talks openly about anxiety symptoms management  Plan:  . LCSW to call client in next 3 weeks to further discuss management of anxiety symptoms of client. . Client to call LCSW as needed to discuss psychosocial needs of client.  .  Client to use relaxation techniques of choice to help manage anxiety symptoms . Client to communicate with RNCM as needed to discuss nursing needs of client  *initial goal documentation        Materials Provided: No  Follow Up Plan: LCSW to call client in next 3 weeks to further discuss management of anxiety symptoms of client  The patient verbalized understanding of instructions provided today and declined a print copy of patient instruction materials.   Norva Riffle.Cameron Atkins MSW, LCSW Licensed Clinical Social Worker [facility] Care [facility] [phone]

## 2019-08-09 ENCOUNTER — Encounter (HOSPITAL_COMMUNITY): Payer: Self-pay | Admitting: Psychiatry

## 2019-08-09 ENCOUNTER — Ambulatory Visit (INDEPENDENT_AMBULATORY_CARE_PROVIDER_SITE_OTHER): Payer: Medicare Other | Admitting: Psychiatry

## 2019-08-09 ENCOUNTER — Other Ambulatory Visit: Payer: Self-pay

## 2019-08-09 DIAGNOSIS — F1320 Sedative, hypnotic or anxiolytic dependence, uncomplicated: Secondary | ICD-10-CM

## 2019-08-09 MED ORDER — LORAZEPAM 1 MG PO TABS: 1.0000 mg | ORAL_TABLET | Freq: Three times a day (TID) | ORAL | 0 refills | Status: DC | PRN

## 2019-08-09 NOTE — Patient Instructions (Signed)
1. Continue lorazepam 1 mg three times a day 2. Please discuss with your primary care doctor about transferring to other provider for your care

## 2019-08-14 DIAGNOSIS — Z23 Encounter for immunization: Secondary | ICD-10-CM | POA: Diagnosis not present

## 2019-08-21 ENCOUNTER — Other Ambulatory Visit: Payer: Self-pay | Admitting: Family Medicine

## 2019-08-21 DIAGNOSIS — F411 Generalized anxiety disorder: Secondary | ICD-10-CM

## 2019-08-30 ENCOUNTER — Ambulatory Visit (INDEPENDENT_AMBULATORY_CARE_PROVIDER_SITE_OTHER): Payer: Medicare Other | Admitting: Licensed Clinical Social Worker

## 2019-08-30 DIAGNOSIS — I10 Essential (primary) hypertension: Secondary | ICD-10-CM | POA: Diagnosis not present

## 2019-08-30 DIAGNOSIS — D649 Anemia, unspecified: Secondary | ICD-10-CM | POA: Diagnosis not present

## 2019-08-30 DIAGNOSIS — F419 Anxiety disorder, unspecified: Secondary | ICD-10-CM

## 2019-08-30 DIAGNOSIS — F339 Major depressive disorder, recurrent, unspecified: Secondary | ICD-10-CM

## 2019-08-30 DIAGNOSIS — G2581 Restless legs syndrome: Secondary | ICD-10-CM

## 2019-08-30 DIAGNOSIS — F1320 Sedative, hypnotic or anxiolytic dependence, uncomplicated: Secondary | ICD-10-CM

## 2019-08-30 NOTE — Patient Instructions (Addendum)
Licensed Clinical Social Worker Visit Information  Goals we discussed today:  Goals        . "I need help in managing anxiety" (pt-stated)     Current Barriers:  Marland Kitchen Mental Health Concerns (Anxiety Disorder)  Clinical Social Work Clinical Goal(s): Over the next 30 days, client will work with LCSW to address concerns related to management of anxiety symptoms of client  Interventions:  LCSW talked with client about management of anxiety symptoms of client and approaches for managing anxiety symptoms.   LCSW talked with client about social work needs of client  LCSW talked with client about relaxation techniques of choice to help client manage anxiety symptoms (enjoys working on Therapist, music projects, talks with spouse, said he enjoys having a cup of coffee)  Encouraged client to talk with RNCM to discuss nursing needs of client  Talked with client about his recent visit with Dr. Modesta Messing, psychiatrist  Provided counseling support for client  Patient Self Care Activities:  . Self administers medications as prescribed. . Attends all scheduled provider appointments . Talks openly about anxiety symptoms management  Plan:  . LCSW to call client in next 3 weeks to further discuss management of anxiety symptoms of client. . Client to call LCSW as needed to discuss psychosocial needs of client.  .  Client to use relaxation techniques of choice to help manage anxiety symptoms . Client to communicate with RNCM as needed to discuss nursing needs of client  *initial goal documentation       Materials Provided: No  Follow Up Plan: LCSW to call client in next 3 weeks to further discuss management of anxiety symptoms of client  The patient verbalized understanding of instructions provided today and declined a print copy of patient instruction materials.   Norva Riffle.Jaegar Croft MSW, LCSW Licensed Clinical Social Worker [facility] [phone]

## 2019-08-30 NOTE — Chronic Care Management (AMB) (Signed)
Care Management Note   Cameron Atkins is a 73 y.o. year old male who is a primary care patient of Janora Norlander, DO. The CM team was consulted for assistance with chronic disease management and care coordination.   I reached out to [name] by phone today.    Review of patient status, including review of consultants reports, relevant laboratory and other test results, and collaboration with appropriate care team members and the patient's provider was performed as part of comprehensive patient evaluation and provision of chronic care management services.   Social determinants of health: risk of social isolation; risk of tobacco use; risk of physical inactivity    Office Visit from 04/09/2019 in [place]  PHQ-9 Total Score  0     GAD 7 : Generalized Anxiety Score 04/09/2019 11/20/2018 09/04/2018 07/10/2018  Nervous, Anxious, on Edge 3 1 1 1   Control/stop worrying 1 1 1 1   Worry too much - different things 1 0 1 1  Trouble relaxing 2 1 1 1   Restless 1 0 0 0  Easily annoyed or irritable 1 1 0 0  Afraid - awful might happen 0 0 1 1  Total GAD 7 Score 9 4 5 5   Anxiety Difficulty - Somewhat difficult Somewhat difficult Somewhat difficult   Medications   New medications from outside sources are available for reconciliation   calcium citrate-vitamin D (CITRACAL+D) 315-200 MG-UNIT per tablet    Cholecalciferol (VITAMIN D3) 3000 units TABS    cimetidine (TAGAMET) 400 MG tablet    doxazosin (CARDURA) 4 MG tablet    fish oil-omega-3 fatty acids 1000 MG capsule    gabapentin (NEURONTIN) 600 MG tablet    hydrOXYzine (VISTARIL) 25 MG capsule    lisinopril (ZESTRIL) 20 MG tablet    LORazepam (ATIVAN) 1 MG tablet    LORazepam (ATIVAN) 1 MG tablet    Multiple Vitamin (MULTIVITAMIN) tablet    pantoprazole (PROTONIX) 40 MG tablet    rOPINIRole (REQUIP) 0.25 MG tablet(Expired)    triamcinolone cream (KENALOG) 0.1 %    Goals    . "I need help in managing  anxiety" (pt-stated)     Current Barriers:  Marland Kitchen Mental Health Concerns (Anxiety Disorder)  Clinical Social Work Clinical Goal(s): Over the next 30 days, client will work with LCSW to address concerns related to management of anxiety symptoms of client  Interventions: . LCSW talked with client about management of anxiety symptoms of client and approaches for managing anxiety symptoms.  Marland Kitchen LCSW talked with client about social work needs of client . LCSW talked with client about relaxation techniques of choice to help client manage anxiety symptoms (enjoys working on Therapist, music projects, talks with spouse, said he enjoys having a cup of coffee) . Encouraged client to talk with RNCM to discuss nursing needs of client . Talked with client about his recent visit with Dr. Modesta Messing, psychiatrist . Provided counseling support for client   Patient Self Care Activities:  . Self administers medications as prescribed. . Attends all scheduled provider appointments . Talks openly about anxiety symptoms management  Plan:  . LCSW to call client in next 3 weeks to further discuss management of anxiety symptoms of client. . Client to call LCSW as needed to discuss psychosocial needs of client.  .  Client to use relaxation techniques of choice to help manage anxiety symptoms . Client to communicate with RNCM as needed to discuss nursing needs of client  *initial goal documentation  Follow Up Plan: LCSW to call client in next 3 weeks to further discuss management of anxiety symptoms of client  Norva Riffle.Anaid Haney MSW, LCSW Licensed Clinical Social Worker [facility] Care [facility] [phone]

## 2019-09-10 DIAGNOSIS — Z0189 Encounter for other specified special examinations: Secondary | ICD-10-CM | POA: Diagnosis not present

## 2019-09-10 DIAGNOSIS — F419 Anxiety disorder, unspecified: Secondary | ICD-10-CM | POA: Diagnosis not present

## 2019-09-10 DIAGNOSIS — I10 Essential (primary) hypertension: Secondary | ICD-10-CM | POA: Diagnosis not present

## 2019-09-10 DIAGNOSIS — K219 Gastro-esophageal reflux disease without esophagitis: Secondary | ICD-10-CM | POA: Diagnosis not present

## 2019-09-10 DIAGNOSIS — G2581 Restless legs syndrome: Secondary | ICD-10-CM | POA: Diagnosis not present

## 2019-09-15 ENCOUNTER — Other Ambulatory Visit: Payer: Self-pay | Admitting: Family Medicine

## 2019-09-15 DIAGNOSIS — G2581 Restless legs syndrome: Secondary | ICD-10-CM

## 2019-09-18 ENCOUNTER — Other Ambulatory Visit: Payer: Self-pay | Admitting: Family Medicine

## 2019-09-18 DIAGNOSIS — F411 Generalized anxiety disorder: Secondary | ICD-10-CM

## 2019-09-18 DIAGNOSIS — I10 Essential (primary) hypertension: Secondary | ICD-10-CM

## 2019-09-25 ENCOUNTER — Ambulatory Visit: Payer: Medicare Other | Admitting: Licensed Clinical Social Worker

## 2019-09-25 DIAGNOSIS — F419 Anxiety disorder, unspecified: Secondary | ICD-10-CM

## 2019-09-25 DIAGNOSIS — M810 Age-related osteoporosis without current pathological fracture: Secondary | ICD-10-CM

## 2019-09-25 NOTE — Chronic Care Management (AMB) (Signed)
Care Management Note   Cameron Atkins is a 73 y.o. year old male who is a primary care patient of Janora Norlander, DO. The CM team was consulted for assistance with chronic disease management and care coordination.   I reached out to [name] by phone today.   Review of patient status, including review of consultants reports, relevant laboratory and other test results, and collaboration with appropriate care team members and the patient's provider was performed as part of comprehensive patient evaluation and provision of chronic care management services.    Social determinants of health: risk of social isolation; risk of tobacco use; risk of physical inactivity    Office Visit from 04/09/2019 in [place]  PHQ-9 Total Score  0     GAD 7 : Generalized Anxiety Score 04/09/2019 11/20/2018 09/04/2018 07/10/2018  Nervous, Anxious, on Edge 3 1 1 1   Control/stop worrying 1 1 1 1   Worry too much - different things 1 0 1 1  Trouble relaxing 2 1 1 1   Restless 1 0 0 0  Easily annoyed or irritable 1 1 0 0  Afraid - awful might happen 0 0 1 1  Total GAD 7 Score 9 4 5 5   Anxiety Difficulty - Somewhat difficult Somewhat difficult Somewhat difficult   Medications   (very important)  New medications from outside sources are available for reconciliation  calcium citrate-vitamin D (CITRACAL+D) 315-200 MG-UNIT per tablet Cholecalciferol (VITAMIN D3) 3000 units TABS cimetidine (TAGAMET) 400 MG tablet doxazosin (CARDURA) 4 MG tablet fish oil-omega-3 fatty acids 1000 MG capsule gabapentin (NEURONTIN) 600 MG tablet hydrOXYzine (VISTARIL) 25 MG capsule lisinopril (ZESTRIL) 20 MG tablet LORazepam (ATIVAN) 1 MG tablet LORazepam (ATIVAN) 1 MG tablet Multiple Vitamin (MULTIVITAMIN) tablet pantoprazole (PROTONIX) 40 MG tablet rOPINIRole (REQUIP) 0.25 MG tablet triamcinolone cream (KENALOG) 0.1 %  Goals Addressed            This Visit's Progress   . "I need help  in managing anxiety" (pt-stated)       Current Barriers:  Marland Kitchen Mental Health Concerns (Anxiety Disorder) in patient with Chronic Diagnoses of GERD, Anxiety, Osteoporosis  Clinical Social Work Clinical Goal(s): Over the next 30 days, client will work with LCSW to address concerns related to management of anxiety symptoms of client  Interventions:  Previously LCSW talked with client about management of anxiety symptoms of client and approaches for managing anxiety symptoms.   Previously LCSW talked with client about social work needs of client  LCSW talked with client previously about relaxation techniques of choice to help client manage anxiety symptoms (enjoys working on Therapist, music projects, talks with spouse, said he enjoys having a cup of coffee)  Previously encouraged client to talk with RNCM to discuss nursing needs of client  Patient Self Care Activities:  . Self administers medications as prescribed. . Attends all scheduled provider appointments . Talks openly about anxiety symptoms management  Plan:  . LCSW to call client in next 4 weeks to further discuss management of anxiety symptoms of client. . Client to call LCSW as needed to discuss psychosocial needs of client.  .  Client to use relaxation techniques of choice to help manage anxiety symptoms . Client to communicate with RNCM as needed to discuss nursing needs of client  *initial goal documentation      Follow Up Plan: LCSW to call client in next 4 weeks to further discuss client management of anxiety symptoms  Norva Riffle.Hal Norrington MSW, LCSW Licensed Clinical  Social Worker [facility] [phone]

## 2019-09-25 NOTE — Patient Instructions (Addendum)
Licensed Clinical Social Worker Visit Information  Goals we discussed today:  Goals Addressed            This Visit's Progress   . "I need help in managing anxiety" (pt-stated)       Current Barriers:  Marland Kitchen Mental Health Concerns (Anxiety Disorder) in patient with Chronic Diagnoses of GERD, Anxiety, Osteoporosis  Clinical Social Work Clinical Goal(s): Over the next 30 days, client will work with LCSW to address concerns related to management of anxiety symptoms of client  Interventions:   Previously LCSW talked with client about management of anxiety symptoms of client and approaches for managing anxiety symptoms.   Previously LCSW talked with client about social work needs of client  LCSW talked with client previously about relaxation techniques of choice to help client manage anxiety symptoms(enjoys working on Therapist, music projects, talks with spouse, said he enjoys having a cup of coffee)  Previously encouraged client to talk with RNCM to discuss nursing needs of client  Patient Self Care Activities:  . Self administers medications as prescribed. . Attends all scheduled provider appointments . Talks openly about anxiety symptoms management  Plan:  . LCSW to call client in next 4 weeks to further discuss management of anxiety symptoms of client. . Client to call LCSW as needed to discuss psychosocial needs of client.  .  Client to use relaxation techniques of choice to help manage anxiety symptoms . Client to communicate with RNCM as needed to discuss nursing needs of client  *initial goal documentation        Materials Provided: No  Follow Up Plan: LCSW to call client in next 4 weeks to further discuss management of anxiety issues of client  The patient verbalized understanding of instructions provided today and declined a print copy of patient instruction materials.   Norva Riffle.Vasili Fok MSW, LCSW Licensed Clinical Social Worker [facility]  Care Management [phone]

## 2019-10-01 ENCOUNTER — Ambulatory Visit (INDEPENDENT_AMBULATORY_CARE_PROVIDER_SITE_OTHER): Payer: Medicare Other | Admitting: Licensed Clinical Social Worker

## 2019-10-01 DIAGNOSIS — F419 Anxiety disorder, unspecified: Secondary | ICD-10-CM

## 2019-10-01 DIAGNOSIS — M810 Age-related osteoporosis without current pathological fracture: Secondary | ICD-10-CM

## 2019-10-01 NOTE — Chronic Care Management (AMB) (Signed)
Care Management Note   Cameron Atkins is a 74 y.o. year old male who is a primary care patient of Janora Norlander, DO. The CM team was consulted for assistance with chronic disease management and care coordination.   I reached out to [name] by phone today.   Review of patient status, including review of consultants reports, relevant laboratory and other test results, and collaboration with appropriate care team members and the patient's provider was performed as part of comprehensive patient evaluation and provision of chronic care management services.   Social determinants of health: risk of social isolation; risk of tobacco use; risk of physical inactivity    Office Visit from 04/09/2019 in [place]  PHQ-9 Total Score  0     GAD 7 : Generalized Anxiety Score 04/09/2019 11/20/2018 09/04/2018 07/10/2018  Nervous, Anxious, on Edge _0 Control/stop worrying _1 Worry too much - different things 1 0 1 1  Trouble relaxing _2 Restless 1 0 0 0  Easily annoyed or irritable 1 1 0 0  Afraid - awful might happen 0 0 1 1  Total GAD 7 Score _3 Anxiety Difficulty - Somewhat difficult Somewhat difficult Somewhat difficult   Medications   (very important)  New medications from outside sources are available for reconciliation  calcium citrate-vitamin D (CITRACAL+D) 315-200 MG-UNIT per tablet Cholecalciferol (VITAMIN D3) 3000 units TABS cimetidine (TAGAMET) 400 MG tablet doxazosin (CARDURA) 4 MG tablet fish oil-omega-3 fatty acids 1000 MG capsule gabapentin (NEURONTIN) 600 MG tablet hydrOXYzine (VISTARIL) 25 MG capsule lisinopril (ZESTRIL) 20 MG tablet LORazepam (ATIVAN) 1 MG tablet LORazepam (ATIVAN) 1 MG tablet Multiple Vitamin (MULTIVITAMIN) tablet pantoprazole (PROTONIX) 40 MG tablet rOPINIRole (REQUIP) 0.25 MG tablet triamcinolone cream (KENALOG) 0.1 %  Goals        . "I need help in managing anxiety" (pt-stated)    Current Barriers:  Marland Kitchen Mental Health Concerns (Anxiety Disorder) in patient with Chronic Diagnoses of GERD, Anxiety, Osteoporosis  Clinical Social Work Clinical Goal(s): Over the next 30 days, client will work with LCSW to address concerns related to management of anxiety symptoms of client  Interventions: . Encouraged client to talk with RNCM to discuss nursing needs of client . Talked with client about Dr.Gottschalk's recommendation that client get a second psychiatric        opinion regarding medications for client   Talked with client about his recent visit to office of Dr. Allyn Kenner (client reported that he went recently to office of Dr. Nevada Crane and met with PA to discuss his medical needs. He said he also thinks he has a follow up appointment with PA in that office in next 2 months)  Talked with client about psychiatric support in the area  Patient Self Care Activities:  . Self administers medications as prescribed. . Attends all scheduled provider appointments . Talks openly about anxiety symptoms management  Plan:  . LCSW to call client in next 4 weeks to further discuss management of anxiety symptoms of client. . Client to call LCSW as needed to discuss psychosocial needs of client.  .  Client to use relaxation techniques of choice to help manage anxiety symptoms . Client to communicate with RNCM as needed to discuss nursing needs of client  *initial goal documentation     Follow Up Plan:  LCSW to call client in next 4 weeks to further discuss management of anxiety  symptoms of client  Michael S.Forrest MSW, LCSW Licensed Clinical Social Worker [facility] [phone] 

## 2019-10-01 NOTE — Patient Instructions (Addendum)
Licensed Clinical Social Worker Visit Information  Goals we discussed today:  Goals        . "I need help in managing anxiety" (pt-stated)     Current Barriers:  Marland Kitchen Mental Health Concerns (Anxiety Disorder) in patient with Chronic Diagnoses of GERD, Anxiety, Osteoporosis  Clinical Social Work Clinical Goal(s): Over the next 30 days, client will work with LCSW to address concerns related to management of anxiety symptoms of client  Interventions:  Encouraged client to talk with RNCM to discuss nursing needs of client  Talked with client about Dr.Gottschalk's recommendation that client get a second psychiatric        opinion regarding medications for client   Talked with client about his recent visit to office of Dr. Allyn Kenner (client reported that he went recently to office of Dr. Nevada Crane and met with PA to discuss his medical needs. He said he also thinks he has a follow up appointment with PA in that office in next 2 months)  Talked with client about psychiatric support in the area  Patient Self Care Activities:  . Self administers medications as prescribed. . Attends all scheduled provider appointments . Talks openly about anxiety symptoms management  Plan:  . LCSW to call client in next 4 weeks to further discuss management of anxiety symptoms of client. . Client to call LCSW as needed to discuss psychosocial needs of client.  .  Client to use relaxation techniques of choice to help manage anxiety symptoms . Client to communicate with RNCM as needed to discuss nursing needs of client  *initial goal documentation       Materials Provided: No  Follow Up Plan: LCSW to call client in next 4 weeks to further discuss management of anxiety symptoms of client  The patient verbalized understanding of instructions provided today and declined a print copy of patient instruction materials.   Norva Riffle.Anyssa Sharpless MSW, LCSW Licensed Clinical Social Worker [facility] Care Management [phone]

## 2019-10-09 DIAGNOSIS — L578 Other skin changes due to chronic exposure to nonionizing radiation: Secondary | ICD-10-CM | POA: Diagnosis not present

## 2019-10-09 DIAGNOSIS — C4442 Squamous cell carcinoma of skin of scalp and neck: Secondary | ICD-10-CM | POA: Diagnosis not present

## 2019-10-09 DIAGNOSIS — L738 Other specified follicular disorders: Secondary | ICD-10-CM | POA: Diagnosis not present

## 2019-10-09 DIAGNOSIS — D485 Neoplasm of uncertain behavior of skin: Secondary | ICD-10-CM | POA: Diagnosis not present

## 2019-10-16 DIAGNOSIS — Z1329 Encounter for screening for other suspected endocrine disorder: Secondary | ICD-10-CM | POA: Diagnosis not present

## 2019-10-16 DIAGNOSIS — Z1321 Encounter for screening for nutritional disorder: Secondary | ICD-10-CM | POA: Diagnosis not present

## 2019-10-16 DIAGNOSIS — K219 Gastro-esophageal reflux disease without esophagitis: Secondary | ICD-10-CM | POA: Diagnosis not present

## 2019-10-16 DIAGNOSIS — D509 Iron deficiency anemia, unspecified: Secondary | ICD-10-CM | POA: Diagnosis not present

## 2019-10-16 DIAGNOSIS — I10 Essential (primary) hypertension: Secondary | ICD-10-CM | POA: Diagnosis not present

## 2019-10-17 ENCOUNTER — Other Ambulatory Visit: Payer: Self-pay | Admitting: Family Medicine

## 2019-10-17 DIAGNOSIS — F411 Generalized anxiety disorder: Secondary | ICD-10-CM

## 2019-10-22 DIAGNOSIS — G2581 Restless legs syndrome: Secondary | ICD-10-CM | POA: Diagnosis not present

## 2019-10-22 DIAGNOSIS — Z8719 Personal history of other diseases of the digestive system: Secondary | ICD-10-CM | POA: Diagnosis not present

## 2019-10-22 DIAGNOSIS — I10 Essential (primary) hypertension: Secondary | ICD-10-CM | POA: Diagnosis not present

## 2019-10-22 DIAGNOSIS — Z0189 Encounter for other specified special examinations: Secondary | ICD-10-CM | POA: Diagnosis not present

## 2019-10-22 DIAGNOSIS — J019 Acute sinusitis, unspecified: Secondary | ICD-10-CM | POA: Diagnosis not present

## 2019-10-22 DIAGNOSIS — F419 Anxiety disorder, unspecified: Secondary | ICD-10-CM | POA: Diagnosis not present

## 2019-10-22 DIAGNOSIS — K219 Gastro-esophageal reflux disease without esophagitis: Secondary | ICD-10-CM | POA: Diagnosis not present

## 2019-10-25 ENCOUNTER — Ambulatory Visit: Payer: Medicare Other | Admitting: Licensed Clinical Social Worker

## 2019-10-25 DIAGNOSIS — M810 Age-related osteoporosis without current pathological fracture: Secondary | ICD-10-CM

## 2019-10-25 DIAGNOSIS — F419 Anxiety disorder, unspecified: Secondary | ICD-10-CM

## 2019-10-25 NOTE — Chronic Care Management (AMB) (Signed)
Care Management Note   Cameron Atkins is a 74 y.o. year old male who is a primary care patient of Janora Norlander, DO. The CM team was consulted for assistance with chronic disease management and care coordination.   I reached out to [name] by phone today.   Review of patient status, including review of consultants reports, relevant laboratory and other test results, and collaboration with appropriate care team members and the patient's provider was performed as part of comprehensive patient evaluation and provision of chronic care management services.   Social determinants of health: risk of social isolation; risk of tobacco use; risk of physical inactivity    Office Visit from 04/09/2019 in [place]  PHQ-9 Total Score  0      GAD 7 : Generalized Anxiety Score 04/09/2019 11/20/2018 09/04/2018 07/10/2018  Nervous, Anxious, on Edge '3 1 1 1  '$ Control/stop worrying '1 1 1 1  '$ Worry too much - different things 1 0 1 1  Trouble relaxing '2 1 1 1  '$ Restless 1 0 0 0  Easily annoyed or irritable 1 1 0 0  Afraid - awful might happen 0 0 1 1  Total GAD 7 Score '9 4 5 5  '$ Anxiety Difficulty - Somewhat difficult Somewhat difficult Somewhat difficult   Medications   (very important)  New medications from outside sources are available for reconciliation  calcium citrate-vitamin D (CITRACAL+D) 315-200 MG-UNIT per tablet Cholecalciferol (VITAMIN D3) 3000 units TABS cimetidine (TAGAMET) 400 MG tablet doxazosin (CARDURA) 4 MG tablet fish oil-omega-3 fatty acids 1000 MG capsule gabapentin (NEURONTIN) 600 MG tablet hydrOXYzine (VISTARIL) 25 MG capsule lisinopril (ZESTRIL) 20 MG tablet LORazepam (ATIVAN) 1 MG tablet LORazepam (ATIVAN) 1 MG tablet Multiple Vitamin (MULTIVITAMIN) tablet pantoprazole (PROTONIX) 40 MG tablet rOPINIRole (REQUIP) 0.25 MG tablet triamcinolone cream (KENALOG) 0.1 %  Goals    . "I need help in managing anxiety" (pt-stated)     Current  Barriers:  Marland Kitchen Mental Health Concerns (Anxiety Disorder) in patient with Chronic Diagnoses of GERD, Anxiety, Osteoporosis  Clinical Social Work Clinical Goal(s): Over the next 30 days, client will work with LCSW to address concerns related to management of anxiety symptoms of client  Interventions:  Encouraged client to talk with RNCM to discuss nursing needs of client  Previously talked with client about Dr.Gottschalk's recommendation that client get a second psychiatric        opinion regarding medications for client   Talked with client about his recent visit to office of Dr. Allyn Kenner (client reported that he went recently to office of Dr. [place] Crane and met with PA to discuss his medical needs. He said he also thinks he has a follow up appointment with PA in that office in next 2 months)  Talked with client about psychiatric support in the area  Talked with client about his feelings regarding medical care received  Talked with client about client management of anxiety symptoms  Patient Self Care Activities:  . Self administers medications as prescribed. . Attends all scheduled provider appointments . Talks openly about anxiety symptoms management  Plan:  . LCSW to call client in next 4 weeks to further discuss management of anxiety symptoms of client. . Client to call LCSW as needed to discuss psychosocial needs of client.  .  Client to use relaxation techniques of choice to help manage anxiety symptoms . Client to communicate with RNCM as needed to discuss nursing needs of client  *initial goal documentation  Follow Up Plan: LCSW to call client in next 4 weeks to further discuss  management of anxiety symptoms of client  Norva Riffle.Wendelin Reader MSW, LCSW Licensed Clinical Social Worker [facility] Care Management [phone]

## 2019-10-25 NOTE — Patient Instructions (Addendum)
Licensed Clinical Social Worker Visit Information  Goals we discussed today:  Goals    .         Marland Kitchen "I need help in managing anxiety" (pt-stated)     Current Barriers:  Marland Kitchen Mental Health Concerns (Anxiety Disorder) in patient with Chronic Diagnoses of GERD, Anxiety, Osteoporosis  Clinical Social Work Clinical Goal(s): Over the next 30 days, client will work with LCSW to address concerns related to management of anxiety symptoms of client  Interventions:   Encouraged client to talk with RNCM to discuss nursing needs of client  Previously talked with client about Dr.Gottschalk's recommendation that client get a second psychiatric  opinion regarding medications for client   Talked with client about his recent visit to office of Dr. Allyn Kenner (client reported that he went recently to office of Dr. Nevada Crane and met with PA to discuss his medical needs. He said he also thinks he has a follow up appointment with PA in that office in next 2 months)  Talked with client about psychiatric support in the area  Talked with client about his feelings regarding medical care received  Talked with client about client management of anxiety symptoms  Patient Self Care Activities:  . Self administers medications as prescribed. . Attends all scheduled provider appointments . Talks openly about anxiety symptoms management  Plan:  . LCSW to call client in next 4 weeks to further discuss management of anxiety symptoms of client. . Client to call LCSW as needed to discuss psychosocial needs of client.  .  Client to use relaxation techniques of choice to help manage anxiety symptoms . Client to communicate with RNCM as needed to discuss nursing needs of client  *initial goal documentation      Materials Provided: No  Follow Up Plan: LCSW to call client in next 4 weeks to further discuss management of anxiety  symptoms of client  The patient verbalized understanding of instructions provided today  and declined a print copy of patient instruction materials.   Norva Riffle.Areli Frary MSW, LCSW Licensed Clinical Social Worker [facility] [phone]

## 2019-10-29 DIAGNOSIS — K219 Gastro-esophageal reflux disease without esophagitis: Secondary | ICD-10-CM | POA: Diagnosis not present

## 2019-10-29 DIAGNOSIS — G2581 Restless legs syndrome: Secondary | ICD-10-CM | POA: Diagnosis not present

## 2019-10-29 DIAGNOSIS — D649 Anemia, unspecified: Secondary | ICD-10-CM | POA: Diagnosis not present

## 2019-10-29 DIAGNOSIS — R7303 Prediabetes: Secondary | ICD-10-CM | POA: Diagnosis not present

## 2019-10-29 DIAGNOSIS — F419 Anxiety disorder, unspecified: Secondary | ICD-10-CM | POA: Diagnosis not present

## 2019-10-29 DIAGNOSIS — I10 Essential (primary) hypertension: Secondary | ICD-10-CM | POA: Diagnosis not present

## 2019-10-29 DIAGNOSIS — K5792 Diverticulitis of intestine, part unspecified, without perforation or abscess without bleeding: Secondary | ICD-10-CM | POA: Diagnosis not present

## 2019-10-29 DIAGNOSIS — J018 Other acute sinusitis: Secondary | ICD-10-CM | POA: Diagnosis not present

## 2019-11-06 DIAGNOSIS — D485 Neoplasm of uncertain behavior of skin: Secondary | ICD-10-CM | POA: Diagnosis not present

## 2019-11-06 DIAGNOSIS — C4442 Squamous cell carcinoma of skin of scalp and neck: Secondary | ICD-10-CM | POA: Diagnosis not present

## 2019-11-14 DIAGNOSIS — R3912 Poor urinary stream: Secondary | ICD-10-CM | POA: Diagnosis not present

## 2019-11-14 DIAGNOSIS — N401 Enlarged prostate with lower urinary tract symptoms: Secondary | ICD-10-CM | POA: Diagnosis not present

## 2019-11-14 DIAGNOSIS — R3915 Urgency of urination: Secondary | ICD-10-CM | POA: Diagnosis not present

## 2019-11-22 ENCOUNTER — Ambulatory Visit (INDEPENDENT_AMBULATORY_CARE_PROVIDER_SITE_OTHER): Payer: Medicare Other | Admitting: Licensed Clinical Social Worker

## 2019-11-22 DIAGNOSIS — F419 Anxiety disorder, unspecified: Secondary | ICD-10-CM

## 2019-11-22 DIAGNOSIS — M810 Age-related osteoporosis without current pathological fracture: Secondary | ICD-10-CM

## 2019-11-22 NOTE — Patient Instructions (Addendum)
Licensed Clinical Social Worker Visit Information  Goals we discussed today:  Goals        . "I need help in managing anxiety" (pt-stated)     Current Barriers:  Marland Kitchen Mental Health Concerns (Anxiety Disorder) in patient with Chronic Diagnoses of GERD, Anxiety, Osteoporosis  Clinical Social Work Clinical Goal(s): Over the next 30 days, client will work with LCSW to address concerns related to management of anxiety symptoms of client  Interventions:  Encouraged client to talk with RNCM to discuss nursing needs of client  Previously talked with client about Dr.Gottschalk's recommendation that client get a second psychiatric opinion regarding medications for client  Talked with client about his recent visit to office of Dr. Allyn Kenner (client reported that he went recently to office of Dr. Nevada Crane and met with PA to discuss his medical needs. He said he also thinks he has a follow up appointment with PA in that office in next 2 months) Talked with client about psychiatric support in the area  Talked with client about his feelings regarding medical care received  Talked with client about client management of anxiety symptoms  Talked with client about sleeping issues of client  Talked with client about social support network  Provided counseling support for client  Talked with client about mental health care through [facility] in [place], [place].  Patient Self Care Activities:  . Self administers medications as prescribed. . Attends all scheduled provider appointments . Talks openly about anxiety symptoms management  Plan:  . LCSW to call client in next 4 weeks to further discuss management of anxiety symptoms of client. . Client to call LCSW as needed to discuss psychosocial needs of client.  .  Client to use relaxation techniques of choice to help manage anxiety symptoms . Client to communicate with RNCM as needed to discuss nursing needs of client  *initial goal documentation           Materials Provided: No  Follow Up Plan: LCSW to call client in next 4 weeks to discuss management of anxiety symptoms of client  The patient verbalized understanding of instructions provided today and declined a print copy of patient instruction materials.   Norva Riffle.Olga Seyler MSW, LCSW Licensed Clinical Social Worker [facility] [phone]

## 2019-11-22 NOTE — Chronic Care Management (AMB) (Signed)
Care Management Note   Cameron Atkins is a 74 y.o. year old male who is a primary care patient of Janora Norlander, DO. The CM team was consulted for assistance with chronic disease management and care coordination.   I reached out to [name] by phone today.    Review of patient status, including review of consultants reports, relevant laboratory and other test results, and collaboration with appropriate care team members and the patient's provider was performed as part of comprehensive patient evaluation and provision of chronic care management services.   Social determinants of health: risk of social isolation; risk of tobacco use; risk of physical inactivity    Office Visit from 04/09/2019 in [facility]  PHQ-9 Total Score  0     GAD 7 : Generalized Anxiety Score 04/09/2019 11/20/2018 09/04/2018 07/10/2018  Nervous, Anxious, on Edge '3 1 1 1  '$ Control/stop worrying '1 1 1 1  '$ Worry too much - different things 1 0 1 1  Trouble relaxing '2 1 1 1  '$ Restless 1 0 0 0  Easily annoyed or irritable 1 1 0 0  Afraid - awful might happen 0 0 1 1  Total GAD 7 Score '9 4 5 5  '$ Anxiety Difficulty - Somewhat difficult Somewhat difficult Somewhat difficult   Medications   (very important)  New medications from outside sources are available for reconciliation  calcium citrate-vitamin D (CITRACAL+D) 315-200 MG-UNIT per tablet Cholecalciferol (VITAMIN D3) 3000 units TABS cimetidine (TAGAMET) 400 MG tablet doxazosin (CARDURA) 4 MG tablet fish oil-omega-3 fatty acids 1000 MG capsule gabapentin (NEURONTIN) 600 MG tablet hydrOXYzine (VISTARIL) 25 MG capsule lisinopril (ZESTRIL) 20 MG tablet LORazepam (ATIVAN) 1 MG tablet LORazepam (ATIVAN) 1 MG tablet Multiple Vitamin (MULTIVITAMIN) tablet pantoprazole (PROTONIX) 40 MG tablet rOPINIRole (REQUIP) 0.25 MG tablet triamcinolone cream (KENALOG) 0.1 %  Goals        . "I need help in managing anxiety" (pt-stated)    Current Barriers:  Marland Kitchen Mental Health Concerns (Anxiety Disorder) in patient with Chronic Diagnoses of GERD, Anxiety, Osteoporosis  Clinical Social Work Clinical Goal(s): Over the next 30 days, client will work with LCSW to address concerns related to management of anxiety symptoms of client  Interventions:  Encouraged client to talk with RNCM to discuss nursing needs of client  Previously talked with client about Dr.Gottschalk's recommendation that client get a second psychiatric opinion regarding medications for client   Talked with client about his recent visit to office of Dr. Allyn Kenner (client reported that he went recently to office of Dr. Nevada Crane and met with PA to discuss his medical needs. He said he also thinks he has a follow up appointment with PA in that office in next 2 months)  Talked with client about psychiatric support in the area  Talked with client about his feelings regarding medical care received  Talked with client about client management of anxiety symptoms   Talked with client about sleeping issues of client  Talked with client about social support network  Provided counseling support for client  Talked with client about mental health care through [facility] in [place], [place].  Patient Self Care Activities:  . Self administers medications as prescribed. . Attends all scheduled provider appointments . Talks openly about anxiety symptoms management  Plan:  . LCSW to call client in next 4 weeks to further discuss management of anxiety symptoms of client. . Client to call LCSW as needed to discuss psychosocial needs of client.  Marland Kitchen  Client to use relaxation techniques of choice to help manage anxiety symptoms . Client to communicate with RNCM as needed to discuss nursing needs of client  *initial goal documentation      Follow Up Plan: LCSW to call client in next 4 weeks to further discuss management of anxiety symptoms of client  Norva Riffle.Janis Sol MSW, LCSW Licensed Clinical Social Worker [facility] [phone]

## 2019-12-03 DIAGNOSIS — Z23 Encounter for immunization: Secondary | ICD-10-CM | POA: Diagnosis not present

## 2019-12-04 ENCOUNTER — Other Ambulatory Visit: Payer: Self-pay | Admitting: *Deleted

## 2019-12-04 DIAGNOSIS — I10 Essential (primary) hypertension: Secondary | ICD-10-CM

## 2019-12-04 MED ORDER — LISINOPRIL 20 MG PO TABS: 20.0000 mg | ORAL_TABLET | Freq: Every day | ORAL | 0 refills | Status: DC

## 2019-12-06 DIAGNOSIS — H35461 Secondary vitreoretinal degeneration, right eye: Secondary | ICD-10-CM | POA: Diagnosis not present

## 2019-12-06 DIAGNOSIS — H52223 Regular astigmatism, bilateral: Secondary | ICD-10-CM | POA: Diagnosis not present

## 2019-12-06 DIAGNOSIS — H25013 Cortical age-related cataract, bilateral: Secondary | ICD-10-CM | POA: Diagnosis not present

## 2019-12-06 DIAGNOSIS — H5203 Hypermetropia, bilateral: Secondary | ICD-10-CM | POA: Diagnosis not present

## 2019-12-06 DIAGNOSIS — H35363 Drusen (degenerative) of macula, bilateral: Secondary | ICD-10-CM | POA: Diagnosis not present

## 2019-12-06 DIAGNOSIS — H31002 Unspecified chorioretinal scars, left eye: Secondary | ICD-10-CM | POA: Diagnosis not present

## 2019-12-06 DIAGNOSIS — H25813 Combined forms of age-related cataract, bilateral: Secondary | ICD-10-CM | POA: Diagnosis not present

## 2019-12-06 DIAGNOSIS — H524 Presbyopia: Secondary | ICD-10-CM | POA: Diagnosis not present

## 2019-12-06 DIAGNOSIS — H11153 Pinguecula, bilateral: Secondary | ICD-10-CM | POA: Diagnosis not present

## 2019-12-06 DIAGNOSIS — D3132 Benign neoplasm of left choroid: Secondary | ICD-10-CM | POA: Diagnosis not present

## 2019-12-06 DIAGNOSIS — H2513 Age-related nuclear cataract, bilateral: Secondary | ICD-10-CM | POA: Diagnosis not present

## 2019-12-06 DIAGNOSIS — H3589 Other specified retinal disorders: Secondary | ICD-10-CM | POA: Diagnosis not present

## 2019-12-10 DIAGNOSIS — D044 Carcinoma in situ of skin of scalp and neck: Secondary | ICD-10-CM | POA: Diagnosis not present

## 2019-12-20 ENCOUNTER — Telehealth: Payer: No Typology Code available for payment source

## 2019-12-21 DIAGNOSIS — Z23 Encounter for immunization: Secondary | ICD-10-CM | POA: Diagnosis not present

## 2020-01-23 DIAGNOSIS — D485 Neoplasm of uncertain behavior of skin: Secondary | ICD-10-CM | POA: Diagnosis not present

## 2020-01-23 DIAGNOSIS — D2339 Other benign neoplasm of skin of other parts of face: Secondary | ICD-10-CM | POA: Diagnosis not present

## 2020-01-23 DIAGNOSIS — D1801 Hemangioma of skin and subcutaneous tissue: Secondary | ICD-10-CM | POA: Diagnosis not present

## 2020-01-23 DIAGNOSIS — L905 Scar conditions and fibrosis of skin: Secondary | ICD-10-CM | POA: Diagnosis not present

## 2020-01-23 DIAGNOSIS — L738 Other specified follicular disorders: Secondary | ICD-10-CM | POA: Diagnosis not present

## 2020-01-23 DIAGNOSIS — L821 Other seborrheic keratosis: Secondary | ICD-10-CM | POA: Diagnosis not present

## 2020-01-23 DIAGNOSIS — Z85828 Personal history of other malignant neoplasm of skin: Secondary | ICD-10-CM | POA: Diagnosis not present

## 2020-01-23 DIAGNOSIS — D225 Melanocytic nevi of trunk: Secondary | ICD-10-CM | POA: Diagnosis not present

## 2020-01-30 DIAGNOSIS — I10 Essential (primary) hypertension: Secondary | ICD-10-CM | POA: Diagnosis not present

## 2020-01-30 DIAGNOSIS — G2581 Restless legs syndrome: Secondary | ICD-10-CM | POA: Diagnosis not present

## 2020-01-30 DIAGNOSIS — R7303 Prediabetes: Secondary | ICD-10-CM | POA: Diagnosis not present

## 2020-01-30 DIAGNOSIS — N401 Enlarged prostate with lower urinary tract symptoms: Secondary | ICD-10-CM | POA: Diagnosis not present

## 2020-01-30 DIAGNOSIS — F419 Anxiety disorder, unspecified: Secondary | ICD-10-CM | POA: Diagnosis not present

## 2020-01-30 DIAGNOSIS — D649 Anemia, unspecified: Secondary | ICD-10-CM | POA: Diagnosis not present

## 2020-01-30 DIAGNOSIS — K219 Gastro-esophageal reflux disease without esophagitis: Secondary | ICD-10-CM | POA: Diagnosis not present

## 2020-02-04 DIAGNOSIS — R7303 Prediabetes: Secondary | ICD-10-CM | POA: Diagnosis not present

## 2020-02-04 DIAGNOSIS — G2581 Restless legs syndrome: Secondary | ICD-10-CM | POA: Diagnosis not present

## 2020-02-04 DIAGNOSIS — F419 Anxiety disorder, unspecified: Secondary | ICD-10-CM | POA: Diagnosis not present

## 2020-02-04 DIAGNOSIS — I10 Essential (primary) hypertension: Secondary | ICD-10-CM | POA: Diagnosis not present

## 2020-02-04 DIAGNOSIS — D649 Anemia, unspecified: Secondary | ICD-10-CM | POA: Diagnosis not present

## 2020-02-04 DIAGNOSIS — K219 Gastro-esophageal reflux disease without esophagitis: Secondary | ICD-10-CM | POA: Diagnosis not present

## 2020-02-04 DIAGNOSIS — Z8719 Personal history of other diseases of the digestive system: Secondary | ICD-10-CM | POA: Diagnosis not present

## 2020-02-06 DIAGNOSIS — N401 Enlarged prostate with lower urinary tract symptoms: Secondary | ICD-10-CM | POA: Diagnosis not present

## 2020-02-06 DIAGNOSIS — N200 Calculus of kidney: Secondary | ICD-10-CM | POA: Diagnosis not present

## 2020-02-06 DIAGNOSIS — R3915 Urgency of urination: Secondary | ICD-10-CM | POA: Diagnosis not present

## 2020-02-06 DIAGNOSIS — N5201 Erectile dysfunction due to arterial insufficiency: Secondary | ICD-10-CM | POA: Diagnosis not present

## 2020-02-11 DIAGNOSIS — L298 Other pruritus: Secondary | ICD-10-CM | POA: Diagnosis not present

## 2020-02-11 DIAGNOSIS — Z85828 Personal history of other malignant neoplasm of skin: Secondary | ICD-10-CM | POA: Diagnosis not present

## 2020-02-11 DIAGNOSIS — L820 Inflamed seborrheic keratosis: Secondary | ICD-10-CM | POA: Diagnosis not present

## 2020-02-11 DIAGNOSIS — Z48817 Encounter for surgical aftercare following surgery on the skin and subcutaneous tissue: Secondary | ICD-10-CM | POA: Diagnosis not present

## 2020-02-28 DIAGNOSIS — G2581 Restless legs syndrome: Secondary | ICD-10-CM | POA: Diagnosis not present

## 2020-02-28 DIAGNOSIS — G4700 Insomnia, unspecified: Secondary | ICD-10-CM | POA: Diagnosis not present

## 2020-02-28 DIAGNOSIS — F419 Anxiety disorder, unspecified: Secondary | ICD-10-CM | POA: Diagnosis not present

## 2020-04-21 DIAGNOSIS — N401 Enlarged prostate with lower urinary tract symptoms: Secondary | ICD-10-CM | POA: Diagnosis not present

## 2020-04-21 DIAGNOSIS — R3912 Poor urinary stream: Secondary | ICD-10-CM | POA: Diagnosis not present

## 2020-05-07 DIAGNOSIS — G2581 Restless legs syndrome: Secondary | ICD-10-CM | POA: Diagnosis not present

## 2020-05-07 DIAGNOSIS — D649 Anemia, unspecified: Secondary | ICD-10-CM | POA: Diagnosis not present

## 2020-05-07 DIAGNOSIS — F419 Anxiety disorder, unspecified: Secondary | ICD-10-CM | POA: Diagnosis not present

## 2020-05-07 DIAGNOSIS — R7303 Prediabetes: Secondary | ICD-10-CM | POA: Diagnosis not present

## 2020-05-07 DIAGNOSIS — I10 Essential (primary) hypertension: Secondary | ICD-10-CM | POA: Diagnosis not present

## 2020-05-07 DIAGNOSIS — G4700 Insomnia, unspecified: Secondary | ICD-10-CM | POA: Diagnosis not present

## 2020-05-14 DIAGNOSIS — R7303 Prediabetes: Secondary | ICD-10-CM | POA: Diagnosis not present

## 2020-05-14 DIAGNOSIS — G4700 Insomnia, unspecified: Secondary | ICD-10-CM | POA: Diagnosis not present

## 2020-05-14 DIAGNOSIS — G2581 Restless legs syndrome: Secondary | ICD-10-CM | POA: Diagnosis not present

## 2020-05-14 DIAGNOSIS — Z8719 Personal history of other diseases of the digestive system: Secondary | ICD-10-CM | POA: Diagnosis not present

## 2020-05-14 DIAGNOSIS — F419 Anxiety disorder, unspecified: Secondary | ICD-10-CM | POA: Diagnosis not present

## 2020-05-14 DIAGNOSIS — I10 Essential (primary) hypertension: Secondary | ICD-10-CM | POA: Diagnosis not present

## 2020-05-14 DIAGNOSIS — D649 Anemia, unspecified: Secondary | ICD-10-CM | POA: Diagnosis not present

## 2020-05-14 DIAGNOSIS — K219 Gastro-esophageal reflux disease without esophagitis: Secondary | ICD-10-CM | POA: Diagnosis not present

## 2020-05-21 ENCOUNTER — Encounter: Payer: Self-pay | Admitting: Neurology

## 2020-06-11 ENCOUNTER — Other Ambulatory Visit: Payer: Self-pay

## 2020-06-23 ENCOUNTER — Other Ambulatory Visit: Payer: Self-pay

## 2020-06-23 DIAGNOSIS — M79604 Pain in right leg: Secondary | ICD-10-CM

## 2020-06-23 DIAGNOSIS — M25562 Pain in left knee: Secondary | ICD-10-CM

## 2020-06-26 NOTE — Chronic Care Management (AMB) (Signed)
  Chronic Care Management   Outreach Note  07/25/2019 Name: Cameron Atkins MRN: [id number] DOB: [birth year]  Referred by: Janora Norlander, DO Reason for referral : Chronic Care Management (follow up)   An unsuccessful telephone outreach was attempted today. The patient was referred to the case management team for assistance with care management and care coordination.   Follow Up Plan: A HIPAA compliant phone message was left for the patient providing contact information and requesting a return call.   Chong Sicilian, BSN, RN-BC Embedded Chronic Care Manager [facility] Direct Dial: [phone]

## 2020-06-30 ENCOUNTER — Other Ambulatory Visit: Payer: Self-pay

## 2020-06-30 ENCOUNTER — Ambulatory Visit (HOSPITAL_COMMUNITY)
Admission: RE | Admit: 2020-06-30 | Discharge: 2020-06-30 | Disposition: A | Discharge status: Home / Self Care | Payer: Medicare Other | Source: Ambulatory Visit | Attending: Vascular Surgery | Admitting: Vascular Surgery

## 2020-06-30 DIAGNOSIS — M79604 Pain in right leg: Secondary | ICD-10-CM | POA: Insufficient documentation

## 2020-06-30 DIAGNOSIS — N644 Mastodynia: Secondary | ICD-10-CM | POA: Diagnosis not present

## 2020-06-30 DIAGNOSIS — M79605 Pain in left leg: Secondary | ICD-10-CM | POA: Diagnosis not present

## 2020-07-14 ENCOUNTER — Other Ambulatory Visit: Payer: Self-pay

## 2020-07-14 ENCOUNTER — Ambulatory Visit (INDEPENDENT_AMBULATORY_CARE_PROVIDER_SITE_OTHER): Payer: Medicare Other | Admitting: Vascular Surgery

## 2020-07-14 ENCOUNTER — Encounter: Payer: Self-pay | Admitting: Vascular Surgery

## 2020-07-14 VITALS — BP 150/93 | HR 83 | Temp 99.2°F | Resp 14 | Ht 65.5 in | Wt 167.0 lb

## 2020-07-14 DIAGNOSIS — M25562 Pain in left knee: Secondary | ICD-10-CM | POA: Diagnosis not present

## 2020-07-14 DIAGNOSIS — M25561 Pain in right knee: Secondary | ICD-10-CM | POA: Diagnosis not present

## 2020-07-14 NOTE — Progress Notes (Signed)
[facility]  Patient name: Cameron Atkins MRN: [id number] DOB: 1946/05/13 Sex: male  REASON FOR CONSULT: Evaluation lower extremity discomfort to rule out arterial insufficiency  HPI: Cameron Atkins is a 74 y.o. male, here today for evaluation of lower extremity symptoms.  He reports that he has been diagnosed with restless leg syndrome.  He reports a aching in station in his thighs bilaterally into his calves.  This is mainly in his right but can affect his left leg as well.  He ports this is unrelated to walking.  It can occur when he is standing or sitting.  He has no history of tissue loss and no true calf claudication.  Past Medical History:  Diagnosis Date  . Anxiety   . Arthritis   . Bladder stone   . Depression   . Diverticulosis of colon   . GERD (gastroesophageal reflux disease)   . H/O hiatal hernia   . Helicobacter pylori gastritis   . History of colonoscopy with polypectomy    TUBULAR ADENOMA-- 2012  . History of esophageal dilatation    STRICTURE--  LAST DONE 03/2013  . Hypertension    for 5 yrs  . IDA (iron deficiency anemia) 11/22/2018  . Left ureteral calculus   . Osteoporosis   . Wears glasses     Family History  Problem Relation Age of Onset  . Anxiety disorder Father   . Anxiety disorder Son   . Anxiety disorder Daughter     SOCIAL HISTORY: Social History   Socioeconomic History  . Marital status: Married    Spouse name: Horris Latino  . Number of children: 3  . Years of education: 54  . Highest education level: Some college, no degree  Occupational History  . Occupation: retired  Tobacco Use  . Smoking status: Former Smoker    Packs/day: 0.25    Years: 2.00    Pack years: 0.50    Types: Cigarettes    Quit date: 01/14/1969    Years since quitting: 51.5  . Smokeless tobacco: Former Systems developer    Types: Chew    Quit date: 01/15/1999  Vaping Use  . Vaping Use: Never used  Substance and Sexual Activity  . Alcohol use:  Not Currently  . Drug use: No  . Sexual activity: Not Currently    Birth control/protection: None  Other Topics Concern  . Not on file  Social History Narrative  . Not on file   Social Determinants of Health   Financial Resource Strain:   . Difficulty of Paying Living Expenses: Not on file  Food Insecurity:   . Worried About Charity fundraiser in the Last Year: Not on file  . Ran Out of Food in the Last Year: Not on file  Transportation Needs:   . Lack of Transportation (Medical): Not on file  . Lack of Transportation (Non-Medical): Not on file  Physical Activity:   . Days of Exercise per Week: Not on file  . Minutes of Exercise per Session: Not on file  Stress:   . Feeling of Stress : Not on file  Social Connections:   . Frequency of Communication with Friends and Family: Not on file  . Frequency of Social Gatherings with Friends and Family: Not on file  . Attends Religious Services: Not on file  . Active Member of Clubs or Organizations: Not on file  . Attends Archivist Meetings: Not on file  . Marital Status:  Not on file  Intimate Partner Violence:   . Fear of Current or Ex-Partner: Not on file  . Emotionally Abused: Not on file  . Physically Abused: Not on file  . Sexually Abused: Not on file    Allergies  Allergen Reactions  . Benadryl [Diphenhydramine] Other (See Comments)    anxious  . Hctz [Hydrochlorothiazide]     hyponatremia  . Mepergan [Meperidine-Promethazine]     Vomiting.   . Other     Antidepressants--per patient he cannot take.  Marland Kitchen Oxycodone Itching    hyperactivity  . Amoxicillin     Has patient had a PCN reaction causing immediate rash, facial/tongue/throat swelling, SOB or lightheadedness with hypotension:No Has patient had a PCN reaction causing severe rash involving mucus membranes or skin necrosis:No Has patient had a PCN reaction that required hospitalization:No Has patient had a PCN reaction occurring within the last 10  years:Yes If all of the above answers are "NO", then may proceed with Cephalosporin use. "severe anxiety-ativan does not remedy"  . Doxycycline Anxiety  . Flagyl [Metronidazole] Anxiety  . Singulair [Montelukast Sodium] Anxiety    Current Outpatient Medications  Medication Sig Dispense Refill  . calcium citrate-vitamin D (CITRACAL+D) 315-200 MG-UNIT per tablet Take 1 tablet by mouth daily.     . Cholecalciferol (VITAMIN D3) 3000 units TABS Take 3,000 Units by mouth daily.    . cimetidine (TAGAMET) 400 MG tablet Take 1 tablet (400 mg total) by mouth daily as needed. (Patient taking differently: Take 200 mg by mouth at bedtime as needed (for heartburn/reflux.). ) 30 tablet 5  . fish oil-omega-3 fatty acids 1000 MG capsule Take 2 g by mouth daily after breakfast.     . gabapentin (NEURONTIN) 600 MG tablet TAKE 1 TABLET BY MOUTH THREE TIMES DAILY 90 tablet 0  . hydrOXYzine (VISTARIL) 25 MG capsule Take 1 capsule (25 mg total) by mouth every 8 (eight) hours as needed. 90 capsule 3  . lisinopril (ZESTRIL) 20 MG tablet Take 1 tablet (20 mg total) by mouth daily. (Needs to be seen before next refill) 30 tablet 0  . LORazepam (ATIVAN) 1 MG tablet Take 1 tablet (1 mg total) by mouth every 6 (six) hours as needed for anxiety. 120 tablet 1  . Multiple Vitamin (MULTIVITAMIN) tablet Take 1 tablet by mouth daily.    . pantoprazole (PROTONIX) 40 MG tablet TAKE 1 TABLET BY MOUTH EVERY MORNING 30 tablet 11  . rOPINIRole (REQUIP) 0.25 MG tablet TAKE THREE TO FOUR TABLETS BY MOUTH AT BEDTIME 360 tablet 0   No current facility-administered medications for this visit.    REVIEW OF SYSTEMS:  [X]  denotes positive finding, [ ]  denotes negative finding Cardiac  Comments:  Chest pain or chest pressure:    Shortness of breath upon exertion:    Short of breath when lying flat:    Irregular heart rhythm:        Vascular    Pain in calf, thigh, or hip brought on by ambulation:    Pain in feet at night that wakes  you up from your sleep:     Blood clot in your veins:    Leg swelling:         Pulmonary    Oxygen at home:    Productive cough:     Wheezing:         Neurologic    Sudden weakness in arms or legs:     Sudden numbness in arms or legs:  Sudden onset of difficulty speaking or slurred speech:    Temporary loss of vision in one eye:     Problems with dizziness:         Gastrointestinal    Blood in stool:     Vomited blood:         Genitourinary    Burning when urinating:     Blood in urine:        Psychiatric    Major depression:         Hematologic    Bleeding problems:    Problems with blood clotting too easily:        Skin    Rashes or ulcers:        Constitutional    Fever or chills:      PHYSICAL EXAM: Vitals:   07/14/20 1357  BP: (!) 150/93  Pulse: 83  Resp: 14  Temp: 99.2 F (37.3 C)  TempSrc: Other (Comment)  SpO2: 96%  Weight: 167 lb (75.8 kg)  Height: 5' 5.5" (1.664 m)    GENERAL: The patient is a well-nourished male, in no acute distress. The vital signs are documented above. CARDIAC: There is a regular rate and rhythm.  VASCULAR: 2+ radial 2+ femoral 2+ popliteal and 2+ dorsalis pedis pulses bilaterally. PULMONARY: There is good air exchange bilaterally without wheezing or rales. ABDOMEN: Soft and non-tender with normal pitched bowel sounds.  MUSCULOSKELETAL: There are no major deformities or cyanosis. NEUROLOGIC: No focal weakness or paresthesias are detected. SKIN: There are no ulcers or rashes noted. PSYCHIATRIC: The patient has a normal affect.  DATA:   Noninvasive studies from 06/30/2020 were reviewed with the patient.  This shows a completely normal ankle arm index bilaterally.  MEDICAL ISSUES:  Discussed these findings with the patient.  I am unclear as to the etiology of his discomfort but do not see any evidence of arterial or venous pathology that could explain this.  He was reassured with this discussion will see [place] again on an  as-needed basis   Curt Jews [facility] phone [phone]

## 2020-07-15 DIAGNOSIS — R3915 Urgency of urination: Secondary | ICD-10-CM | POA: Diagnosis not present

## 2020-07-15 DIAGNOSIS — N401 Enlarged prostate with lower urinary tract symptoms: Secondary | ICD-10-CM | POA: Diagnosis not present

## 2020-07-15 DIAGNOSIS — R3912 Poor urinary stream: Secondary | ICD-10-CM | POA: Diagnosis not present

## 2020-08-04 DIAGNOSIS — L570 Actinic keratosis: Secondary | ICD-10-CM | POA: Diagnosis not present

## 2020-08-04 DIAGNOSIS — L814 Other melanin hyperpigmentation: Secondary | ICD-10-CM | POA: Diagnosis not present

## 2020-08-04 DIAGNOSIS — D225 Melanocytic nevi of trunk: Secondary | ICD-10-CM | POA: Diagnosis not present

## 2020-08-04 DIAGNOSIS — D1801 Hemangioma of skin and subcutaneous tissue: Secondary | ICD-10-CM | POA: Diagnosis not present

## 2020-08-04 DIAGNOSIS — L820 Inflamed seborrheic keratosis: Secondary | ICD-10-CM | POA: Diagnosis not present

## 2020-08-04 DIAGNOSIS — L578 Other skin changes due to chronic exposure to nonionizing radiation: Secondary | ICD-10-CM | POA: Diagnosis not present

## 2020-08-12 DIAGNOSIS — Z23 Encounter for immunization: Secondary | ICD-10-CM | POA: Diagnosis not present

## 2020-08-14 ENCOUNTER — Emergency Department (HOSPITAL_COMMUNITY)
Admission: EM | Admit: 2020-08-14 | Discharge: 2020-08-14 | Disposition: A | Discharge status: Home / Self Care | Payer: Medicare Other | Attending: Emergency Medicine | Admitting: Emergency Medicine

## 2020-08-14 ENCOUNTER — Other Ambulatory Visit: Payer: Self-pay

## 2020-08-14 ENCOUNTER — Encounter (HOSPITAL_COMMUNITY): Payer: Self-pay | Admitting: *Deleted

## 2020-08-14 ENCOUNTER — Emergency Department (HOSPITAL_COMMUNITY): Payer: Medicare Other

## 2020-08-14 DIAGNOSIS — W228XXA Striking against or struck by other objects, initial encounter: Secondary | ICD-10-CM | POA: Insufficient documentation

## 2020-08-14 DIAGNOSIS — Z79899 Other long term (current) drug therapy: Secondary | ICD-10-CM | POA: Diagnosis not present

## 2020-08-14 DIAGNOSIS — S0291XA Unspecified fracture of skull, initial encounter for closed fracture: Secondary | ICD-10-CM | POA: Insufficient documentation

## 2020-08-14 DIAGNOSIS — Y92410 Unspecified street and highway as the place of occurrence of the external cause: Secondary | ICD-10-CM | POA: Insufficient documentation

## 2020-08-14 DIAGNOSIS — Z87891 Personal history of nicotine dependence: Secondary | ICD-10-CM | POA: Diagnosis not present

## 2020-08-14 DIAGNOSIS — S02832A Fracture of medial orbital wall, left side, initial encounter for closed fracture: Secondary | ICD-10-CM | POA: Diagnosis not present

## 2020-08-14 DIAGNOSIS — S0181XA Laceration without foreign body of other part of head, initial encounter: Secondary | ICD-10-CM | POA: Insufficient documentation

## 2020-08-14 DIAGNOSIS — S0281XA Fracture of other specified skull and facial bones, right side, initial encounter for closed fracture: Secondary | ICD-10-CM | POA: Diagnosis not present

## 2020-08-14 DIAGNOSIS — S0990XA Unspecified injury of head, initial encounter: Secondary | ICD-10-CM | POA: Diagnosis not present

## 2020-08-14 DIAGNOSIS — S0292XA Unspecified fracture of facial bones, initial encounter for closed fracture: Secondary | ICD-10-CM

## 2020-08-14 DIAGNOSIS — Z23 Encounter for immunization: Secondary | ICD-10-CM | POA: Diagnosis not present

## 2020-08-14 DIAGNOSIS — S0219XA Other fracture of base of skull, initial encounter for closed fracture: Secondary | ICD-10-CM | POA: Diagnosis not present

## 2020-08-14 DIAGNOSIS — Y9389 Activity, other specified: Secondary | ICD-10-CM | POA: Diagnosis not present

## 2020-08-14 DIAGNOSIS — I10 Essential (primary) hypertension: Secondary | ICD-10-CM | POA: Insufficient documentation

## 2020-08-14 DIAGNOSIS — M2578 Osteophyte, vertebrae: Secondary | ICD-10-CM | POA: Diagnosis not present

## 2020-08-14 DIAGNOSIS — S01511A Laceration without foreign body of lip, initial encounter: Secondary | ICD-10-CM | POA: Diagnosis not present

## 2020-08-14 DIAGNOSIS — S0003XA Contusion of scalp, initial encounter: Secondary | ICD-10-CM | POA: Diagnosis not present

## 2020-08-14 MED ORDER — HYDROCODONE-ACETAMINOPHEN 5-325 MG PO TABS
1.0000 | ORAL_TABLET | Freq: Once | ORAL | Status: AC
  Administered 2020-08-14: 1 via ORAL
  Filled 2020-08-14: qty 1

## 2020-08-14 MED ORDER — TETANUS-DIPHTH-ACELL PERTUSSIS 5-2.5-18.5 LF-MCG/0.5 IM SUSY
0.5000 mL | PREFILLED_SYRINGE | Freq: Once | INTRAMUSCULAR | Status: AC
  Administered 2020-08-14: 0.5 mL via INTRAMUSCULAR
  Filled 2020-08-14: qty 0.5

## 2020-08-14 MED ORDER — HYDROCODONE-ACETAMINOPHEN 5-325 MG PO TABS
1.0000 | ORAL_TABLET | Freq: Four times a day (QID) | ORAL | 0 refills | Status: DC | PRN
Start: 2020-08-14 — End: 2021-10-15

## 2020-08-14 MED ORDER — LIDOCAINE-EPINEPHRINE-TETRACAINE (LET) TOPICAL GEL
3.0000 mL | Freq: Once | TOPICAL | Status: AC
  Administered 2020-08-14: 3 mL via TOPICAL
  Filled 2020-08-14: qty 3

## 2020-08-14 NOTE — ED Provider Notes (Signed)
..  Laceration Repair  Date/Time: 08/14/2020 8:57 PM Performed by: Barrie Folk, PA-C Authorized by: Merryl Hacker, MD   Consent:    Consent obtained:  Verbal   Consent given by:  Patient   Risks discussed:  Infection, pain, poor cosmetic result and poor wound healing   Alternatives discussed:  No treatment Anesthesia (see MAR for exact dosages):    Anesthesia method:  Topical application   Topical anesthetic:  LET Laceration details:    Location:  Face (laceration above left upper lip. does not involve lip, does not cross vermillion border.)   Length (cm):  2   Depth (mm):  1 Repair type:    Repair type:  Simple Pre-procedure details:    Preparation:  Patient was prepped and draped in usual sterile fashion Exploration:    Hemostasis achieved with:  LET   Wound exploration: wound explored through full range of motion and entire depth of wound probed and visualized     Contaminated: no   Treatment:    Area cleansed with:  Saline   Amount of cleaning:  Standard   Irrigation solution:  Sterile saline   Irrigation method:  Syringe   Visualized foreign bodies/material removed: no   Skin repair:    Repair method:  Sutures and Steri-Strips   Suture size:  5-0   Suture material:  Fast-absorbing gut   Suture technique:  Simple interrupted   Number of sutures:  2   Number of Steri-Strips:  1 Approximation:    Approximation:  Close Post-procedure details:    Dressing:  Open (no dressing)   Patient tolerance of procedure:  Tolerated well, no immediate complications       Lewanda Rife 08/14/20 2059    Merryl Hacker, MD 08/14/20 2251

## 2020-08-14 NOTE — ED Notes (Signed)
Dr Conley Simmonds paged to Dr Dina Rich Facial Trauma

## 2020-08-14 NOTE — ED Provider Notes (Signed)
[facility] EMERGENCY DEPARTMENT Provider Note   CSN: [id number] Arrival date & time: 08/14/20  1816     History Chief Complaint  Patient presents with   Head Injury    Cameron Atkins is a 74 y.o. male.  HPI     This is a 74 year old male with a history of arthritis, hypertension who presents with head trauma.  Patient reports that he was using a tractor to move a tree.  He states that the tree slipped on the bucket of the tractor recoiling into the cab of the tractor.  It hit him directly in the face.  He did not lose consciousness.  Patient states that he is not on any anticoagulants or blood thinners.  He is complaining mostly of facial pain and left-sided neck pain.  Rates his pain at 5 out of 10.  Last tetanus was greater than 5 years ago.  He did not get injured elsewhere.  Denies chest pain, shortness breath, abdominal pain, nausea, vomiting.  Past Medical History:  Diagnosis Date   Anxiety    Arthritis    Bladder stone    Depression    Diverticulosis of colon    GERD (gastroesophageal reflux disease)    H/O hiatal hernia    Helicobacter pylori gastritis    History of colonoscopy with polypectomy    TUBULAR ADENOMA-- 2012   History of esophageal dilatation    STRICTURE--  LAST DONE 03/2013   Hypertension    for 5 yrs   IDA (iron deficiency anemia) 11/22/2018   Left ureteral calculus    Osteoporosis    Wears glasses     Patient Active Problem List   Diagnosis Date Noted   Benzodiazepine dependence ([place]) 10/18/2018   High risk medication use 10/18/2018   Esophageal stricture 08/04/2016   History of colonic polyps 08/04/2016   GERD (gastroesophageal reflux disease) 06/26/2013   Osteoporosis 06/26/2013   Anxiety 06/26/2013    Past Surgical History:  Procedure Laterality Date   BIOPSY  09/10/2016   Procedure: BIOPSY;  Surgeon: Rogene Houston, MD;  [facility];  Service: Endoscopy;;  rectal polyp   COLONOSCOPY WITH  PROPOFOL N/A 09/10/2016   Procedure: COLONOSCOPY WITH PROPOFOL;  Surgeon: Rogene Houston, MD;  [facility]: [facility];  Service: Endoscopy;  Laterality: N/A;   CYSTO/  URETEROSCOPIC LITHOTRIPSY/ STONE EXTRACTION  1995   CYSTOSCOPY W/ RETROGRADES Right 01/15/2014   Procedure: CYSTOSCOPY WITH RIGHT RETROGRADE PYELOGRAM;  Surgeon: Irine Seal, MD;  Location: [facility];  Service: Urology;  Laterality: Right;   CYSTOSCOPY WITH RETROGRADE PYELOGRAM, URETEROSCOPY AND STENT PLACEMENT Left 01/15/2014   Procedure: CYSTOSCOPY , LEFT RETROGRADE PYELOGRAM, LEFT URETEROSCOPY, BASKET STONE EXTRACTION;  Surgeon: Irine Seal, MD;  Location: [facility];  Service: Urology;  Laterality: Left;   ESOPHAGEAL DILATION  03/21/2013   Procedure: ESOPHAGEAL DILATION;  Surgeon: Danie Binder, MD;  [facility]: [facility];  Service: Endoscopy;;   ESOPHAGEAL DILATION N/A 09/10/2016   Procedure: ESOPHAGEAL DILATION;  Surgeon: Rogene Houston, MD;  [facility]: [facility];  Service: Endoscopy;  Laterality: N/A;   ESOPHAGOGASTRODUODENOSCOPY N/A 03/21/2013   Procedure: ESOPHAGOGASTRODUODENOSCOPY (EGD);  Surgeon: Danie Binder, MD;  [facility];  Service: Endoscopy;  Laterality: N/A;   ESOPHAGOGASTRODUODENOSCOPY (EGD) WITH ESOPHAGEAL DILATION N/A 04/26/2013   Procedure: ESOPHAGOGASTRODUODENOSCOPY (EGD) WITH ESOPHAGEAL DILATION;  Surgeon: Rogene Houston, MD;  [facility];  Service: Endoscopy;  Laterality: N/A;  130  ESOPHAGOGASTRODUODENOSCOPY (EGD) WITH PROPOFOL N/A 09/10/2016   Procedure: ESOPHAGOGASTRODUODENOSCOPY (EGD) WITH PROPOFOL;  Surgeon: Rogene Houston, MD;  [facility];  Service: Endoscopy;  Laterality: N/A;  10:20   EXTRACORPOREAL SHOCK WAVE LITHOTRIPSY  X2   YRS AGO   HOLMIUM LASER APPLICATION Left 2/40/9735   Procedure: HOLMIUM LASER APPLICATION, LEFT URETER;  Surgeon: Irine Seal, MD;  Location: [facility];  Service: Urology;   Laterality: Left;   SHOULDER ARTHROSCOPY W/ SUBACROMIAL DECOMPRESSION AND DISTAL CLAVICLE EXCISION Left 2000       Family History  Problem Relation Age of Onset   Anxiety disorder Father    Anxiety disorder Son    Anxiety disorder Daughter     Social History   Tobacco Use   Smoking status: Former Smoker    Packs/day: 0.25    Years: 2.00    Pack years: 0.50    Types: Cigarettes    Quit date: 01/14/1969    Years since quitting: 51.6   Smokeless tobacco: Former Systems developer    Types: Chew    Quit date: 01/15/1999  Vaping Use   Vaping Use: Never used  Substance Use Topics   Alcohol use: Not Currently   Drug use: No    Home Medications Prior to Admission medications   Medication Sig Start Date End Date Taking? Authorizing Provider  calcium citrate-vitamin D (CITRACAL+D) 315-200 MG-UNIT per tablet Take 1 tablet by mouth daily.    Yes [provider]  Cholecalciferol (VITAMIN D3) 3000 units TABS Take 3,000 Units by mouth daily.   Yes [provider]  cimetidine (TAGAMET) 400 MG tablet Take 1 tablet (400 mg total) by mouth daily as needed. Patient taking differently: Take 200 mg by mouth at bedtime as needed (for heartburn/reflux.).  06/26/13  Yes Rehman, Mechele Dawley, MD  ferrous sulfate 325 (65 FE) MG EC tablet Take 1 tablet by mouth daily. 05/14/20  Yes [provider]  gabapentin (NEURONTIN) 600 MG tablet TAKE 1 TABLET BY MOUTH THREE TIMES DAILY 10/17/19  Yes Gottschalk, Ashly M, DO  lisinopril (ZESTRIL) 20 MG tablet Take 1 tablet (20 mg total) by mouth daily. (Needs to be seen before next refill) 12/04/19  Yes Gottschalk, Ashly M, DO  LORazepam (ATIVAN) 1 MG tablet Take 1 tablet (1 mg total) by mouth every 6 (six) hours as needed for anxiety. 06/11/19  Yes Ronnie Doss M, DO  Multiple Vitamin (MULTIVITAMIN) tablet Take 1 tablet by mouth daily.   Yes [provider]  pantoprazole (PROTONIX) 40 MG tablet TAKE 1 TABLET BY MOUTH EVERY MORNING 12/04/18   Yes Setzer, Terri L, NP  polyethylene glycol powder (MIRALAX) 17 GM/SCOOP powder Take 1 Container by mouth once.   Yes [provider]  rOPINIRole (REQUIP) 0.25 MG tablet TAKE THREE TO FOUR TABLETS BY MOUTH AT BEDTIME Patient taking differently: Take 0.25 mg by mouth See admin instructions. Take 3 to 4 tablets by mouth at bedtime for restless legs 09/17/19  Yes Ronnie Doss M, DO  fish oil-omega-3 fatty acids 1000 MG capsule Take 2 g by mouth daily after breakfast.  Patient not taking: Reported on 08/14/2020    [provider]  HYDROcodone-acetaminophen (NORCO/VICODIN) 5-325 MG tablet Take 1 tablet by mouth every 6 (six) hours as needed. 08/14/20   Malcom Selmer, Barbette Hair, MD  hydrOXYzine (VISTARIL) 25 MG capsule Take 1 capsule (25 mg total) by mouth every 8 (eight) hours as needed. Patient not taking: Reported on 08/14/2020 07/31/18   Eustaquio Maize,  MD    Allergies    Benadryl [diphenhydramine], Hctz [hydrochlorothiazide], Mepergan [meperidine-promethazine], Other, Oxycodone, Amoxicillin, Doxycycline, Flagyl [metronidazole], and Singulair [montelukast sodium]  Review of Systems   Review of Systems  Constitutional: Negative for fever.  HENT: Positive for facial swelling.   Respiratory: Negative for shortness of breath.   Cardiovascular: Negative for chest pain.  Gastrointestinal: Negative for abdominal pain, nausea and vomiting.  Musculoskeletal: Positive for neck pain.  Neurological: Positive for headaches. Negative for dizziness.  All other systems reviewed and are negative.   Physical Exam Updated Vital Signs BP (!) 176/86 (BP Location: Left Arm)    Pulse 86    Temp 98.4 F (36.9 C) (Oral)    Resp 17    Ht 1.676 m (5\' 6" )    Wt 75.3 kg    SpO2 98%    BMI 26.79 kg/m   Physical Exam Vitals and nursing note reviewed.  Constitutional:      Appearance: He is well-developed.     Comments: Elderly, ABCs intact  HENT:     Head: Normocephalic.     Comments:  Contusion noted to the mid forehead with small hematoma, additional abrasion noted over the left cheek and over the left eyelid, slight swelling noted over the left upper lip, dentition appears intact, there is a 1 cm vertical laceration just superior to the lips that does not involve the vermilion border    Nose: Nose normal.     Mouth/Throat:     Mouth: Mucous membranes are moist.  Eyes:     Pupils: Pupils are equal, round, and reactive to light.     Comments: Extraocular movements intact  Neck:     Comments: C-collar in place, no midline C-spine tenderness palpation, step-off, deformity, there is left-sided paraspinous muscle tenderness. Cardiovascular:     Rate and Rhythm: Normal rate and regular rhythm.     Heart sounds: Normal heart sounds.  Pulmonary:     Effort: Pulmonary effort is normal. No respiratory distress.     Breath sounds: Normal breath sounds. No wheezing.  Abdominal:     General: Bowel sounds are normal.     Palpations: Abdomen is soft.     Tenderness: There is no abdominal tenderness. There is no rebound.  Musculoskeletal:     Cervical back: Neck supple.     Right lower leg: No edema.     Left lower leg: No edema.  Lymphadenopathy:     Cervical: No cervical adenopathy.  Skin:    General: Skin is warm and dry.  Neurological:     Mental Status: He is alert and oriented to person, place, and time.  Psychiatric:        Mood and Affect: Mood normal.     ED Results / Procedures / Treatments   Labs (all labs ordered are listed, but only abnormal results are displayed) Labs Reviewed - No data to display  EKG None  Radiology CT Head Wo Contrast  Addendum Date: 08/14/2020   ADDENDUM REPORT: 08/14/2020 19:47 ADDENDUM: These results were called by telephone at the time of interpretation on 08/14/2020 at 7:47 pm to provider Cord Wilczynski , who verbally acknowledged these results. Electronically Signed   By: Lovena Le M.D.   On: 08/14/2020 19:47   Result  Date: 08/14/2020 CLINICAL DATA:  Struck in head by tree while moving with a tractor EXAM: CT HEAD WITHOUT CONTRAST CT MAXILLOFACIAL WITHOUT CONTRAST CT CERVICAL SPINE WITHOUT CONTRAST TECHNIQUE: Multidetector CT imaging of the head, cervical  spine, and maxillofacial structures were performed using the standard protocol without intravenous contrast. Multiplanar CT image reconstructions of the cervical spine and maxillofacial structures were also generated. COMPARISON:  CT head and cervical spine 12/22/2017 FINDINGS: CT HEAD FINDINGS Brain: Few hypoattenuating foci in the bilateral basal ganglia likely reflect sequela of prior lacunar type infarcts. No evidence of acute infarction, hemorrhage, hydrocephalus, extra-axial collection, visible mass lesion or mass effect. Symmetric prominence of the ventricles, cisterns and sulci compatible with parenchymal volume loss. Patchy areas of white matter hypoattenuation are most compatible with chronic microvascular angiopathy. Vascular: Atherosclerotic calcification of the carotid siphons and intradural vertebral arteries. No hyperdense vessel. Skull: Frontal midline and glabellar soft tissue swelling with thin crescentic scalp hematoma measuring up to 3 mm in maximal thickness. There is a thin nondisplaced fracture line which extends through the frontal bone to the left of midline extending into the frontal sinuses anteriorly without fracture depression or adjacent pneumocephaly. Other: None. CT MAXILLOFACIAL FINDINGS Osseous: Frontal calvarial fracture extends into the right frontal sinus and frontal sinus septum and minimally into the uppermost ethmoid air cells. Minimally displaced fracture involving the medial wall left orbit/lamina papyracea (8/27). Question additional nondisplaced fracture of left orbital floor. No other discernible fracture of the bony orbits. Nasal bones are intact. No other mid face fractures are seen. The pterygoid plates are intact. No visible or  suspected temporal bone fractures. Temporomandibular joints are normally aligned. The mandible is intact. No fractured or avulsed teeth. Few scattered carious lesions and periapical lucencies within the dentition. Orbits: Asymmetric left supraorbital and palpebral swelling/thickening. Trace thickening adjacent the left lamina papyracea fracture. Minimal thickening and tiny lucent foci of gas along the left orbital floor (8/30). No other retro septal gas, stranding or hemorrhage. Globes appear normal and symmetric. Symmetric appearance of the extraocular musculature and optic nerve sheath complexes. Normal caliber of the superior ophthalmic veins. Sinuses: Nodular mural thickening throughout the paranasal sinuses with some more hyperdense secretions in the left ethmoids possibly reflecting some mild hemosinus. Middle ear cavities are clear. Mastoid air cells are well aerated. Ossicular chains appear normally configured. Soft tissues: Left supraorbital and glabellar soft tissue swelling. Asymmetric swelling of the left malar soft tissues and left upper lip. Some pre mental thickening is present as well. Scattered benign-appearing dermal calcifications. None no soft tissue gas or foreign body. CT CERVICAL SPINE FINDINGS Alignment: Cervical stabilization collar is in place at the time of examination. Some mild levocurvature of the cervical spine with an apex at the C4 level. This may be accentuated by slight right lateral flexion as well as some rightward cranial rotation. Unchanged stepwise retrolisthesis C4-C7 and minimal anterolisthesis C3 on 4 favored to be on a degenerative basis. No evidence of traumatic listhesis. No abnormally widened, perched or jumped facets. Normal alignment of the craniocervical and atlantoaxial articulations. Skull base and vertebrae: No acute skull base fracture. No vertebral body fracture or height loss. Normal bone mineralization. No worrisome osseous lesions. Atlantodental arthrosis and  cervical spondylitic changes, as described below. Soft tissues and spinal canal: No pre or paravertebral fluid or swelling. No visible canal hematoma. Disc levels: Multilevel intervertebral disc height loss with spondylitic endplate changes. Features most pronounced at C3-4 where a larger disc osteophyte complexes present and in combination with some mild ligamentum flavum infolding result in moderate canal stenosis. Additional mild canal stenoses at C4-5, C5-6 as well. Multilevel uncinate spurring and facet hypertrophic changes are present throughout the cervical spine resulting in mild-to-moderate multilevel neural foraminal  narrowing with more moderate to severe narrowing on the right at C3-4. Upper chest: Cervical carotid atherosclerosis. No acute abnormality in the upper chest or imaged lung apices. Other: Normal thyroid. IMPRESSION: 1. Frontal scalp and glabellar soft tissue swelling with subjacent nondisplaced, nondepressed fracture of the frontal bone extending into the frontal sinuses. No extra-axial hemorrhage, pneumocephaly or other acute traumatic intracranial abnormality. 2. Chronic microvascular ischemic changes, parenchymal volume loss and intracranial atherosclerosis. 3. Fracture line extends into the right frontal sinus and the frontal sinus septum with presumed propagation through the left ethmoid air cells. 4. Minimally displaced fracture involving the medial wall left orbit/lamina papyracea. Question additional nondisplaced fracture of the left orbital floor. No other discernible fracture of the bony orbits. Asymmetric left supraorbital and palpebral swelling/thickening. Trace thickening and tiny lucent foci of gas along the left orbital floor and minimal thickening adjacent the lamina papyracea. No other retro septal gas, stranding or hemorrhage. Globes appear normal and symmetric. 5. Nodular mural thickening throughout the paranasal sinuses with some more hyperdense secretions in the left  ethmoids possibly reflecting some trace hemosinus. 6. No acute cervical spine fracture or traumatic listhesis. 7. Multilevel degenerative changes of the cervical spine, as described above. Features are most pronounced at C3-4 where there is moderate canal stenosis and moderate to severe neural foraminal narrowing on the right. 8. Cervical and intracranial atherosclerosis. Currently attempting to contact the ordering provider with a critical value result. Addendum will be submitted upon case discussion. Electronically Signed: By: Lovena Le M.D. On: 08/14/2020 19:44   CT Cervical Spine Wo Contrast  Addendum Date: 08/14/2020   ADDENDUM REPORT: 08/14/2020 19:47 ADDENDUM: These results were called by telephone at the time of interpretation on 08/14/2020 at 7:47 pm to provider Magin Balbi , who verbally acknowledged these results. Electronically Signed   By: Lovena Le M.D.   On: 08/14/2020 19:47   Result Date: 08/14/2020 CLINICAL DATA:  Struck in head by tree while moving with a tractor EXAM: CT HEAD WITHOUT CONTRAST CT MAXILLOFACIAL WITHOUT CONTRAST CT CERVICAL SPINE WITHOUT CONTRAST TECHNIQUE: Multidetector CT imaging of the head, cervical spine, and maxillofacial structures were performed using the standard protocol without intravenous contrast. Multiplanar CT image reconstructions of the cervical spine and maxillofacial structures were also generated. COMPARISON:  CT head and cervical spine 12/22/2017 FINDINGS: CT HEAD FINDINGS Brain: Few hypoattenuating foci in the bilateral basal ganglia likely reflect sequela of prior lacunar type infarcts. No evidence of acute infarction, hemorrhage, hydrocephalus, extra-axial collection, visible mass lesion or mass effect. Symmetric prominence of the ventricles, cisterns and sulci compatible with parenchymal volume loss. Patchy areas of white matter hypoattenuation are most compatible with chronic microvascular angiopathy. Vascular: Atherosclerotic calcification  of the carotid siphons and intradural vertebral arteries. No hyperdense vessel. Skull: Frontal midline and glabellar soft tissue swelling with thin crescentic scalp hematoma measuring up to 3 mm in maximal thickness. There is a thin nondisplaced fracture line which extends through the frontal bone to the left of midline extending into the frontal sinuses anteriorly without fracture depression or adjacent pneumocephaly. Other: None. CT MAXILLOFACIAL FINDINGS Osseous: Frontal calvarial fracture extends into the right frontal sinus and frontal sinus septum and minimally into the uppermost ethmoid air cells. Minimally displaced fracture involving the medial wall left orbit/lamina papyracea (8/27). Question additional nondisplaced fracture of left orbital floor. No other discernible fracture of the bony orbits. Nasal bones are intact. No other mid face fractures are seen. The pterygoid plates are intact. No visible or suspected temporal  bone fractures. Temporomandibular joints are normally aligned. The mandible is intact. No fractured or avulsed teeth. Few scattered carious lesions and periapical lucencies within the dentition. Orbits: Asymmetric left supraorbital and palpebral swelling/thickening. Trace thickening adjacent the left lamina papyracea fracture. Minimal thickening and tiny lucent foci of gas along the left orbital floor (8/30). No other retro septal gas, stranding or hemorrhage. Globes appear normal and symmetric. Symmetric appearance of the extraocular musculature and optic nerve sheath complexes. Normal caliber of the superior ophthalmic veins. Sinuses: Nodular mural thickening throughout the paranasal sinuses with some more hyperdense secretions in the left ethmoids possibly reflecting some mild hemosinus. Middle ear cavities are clear. Mastoid air cells are well aerated. Ossicular chains appear normally configured. Soft tissues: Left supraorbital and glabellar soft tissue swelling. Asymmetric swelling  of the left malar soft tissues and left upper lip. Some pre mental thickening is present as well. Scattered benign-appearing dermal calcifications. None no soft tissue gas or foreign body. CT CERVICAL SPINE FINDINGS Alignment: Cervical stabilization collar is in place at the time of examination. Some mild levocurvature of the cervical spine with an apex at the C4 level. This may be accentuated by slight right lateral flexion as well as some rightward cranial rotation. Unchanged stepwise retrolisthesis C4-C7 and minimal anterolisthesis C3 on 4 favored to be on a degenerative basis. No evidence of traumatic listhesis. No abnormally widened, perched or jumped facets. Normal alignment of the craniocervical and atlantoaxial articulations. Skull base and vertebrae: No acute skull base fracture. No vertebral body fracture or height loss. Normal bone mineralization. No worrisome osseous lesions. Atlantodental arthrosis and cervical spondylitic changes, as described below. Soft tissues and spinal canal: No pre or paravertebral fluid or swelling. No visible canal hematoma. Disc levels: Multilevel intervertebral disc height loss with spondylitic endplate changes. Features most pronounced at C3-4 where a larger disc osteophyte complexes present and in combination with some mild ligamentum flavum infolding result in moderate canal stenosis. Additional mild canal stenoses at C4-5, C5-6 as well. Multilevel uncinate spurring and facet hypertrophic changes are present throughout the cervical spine resulting in mild-to-moderate multilevel neural foraminal narrowing with more moderate to severe narrowing on the right at C3-4. Upper chest: Cervical carotid atherosclerosis. No acute abnormality in the upper chest or imaged lung apices. Other: Normal thyroid. IMPRESSION: 1. Frontal scalp and glabellar soft tissue swelling with subjacent nondisplaced, nondepressed fracture of the frontal bone extending into the frontal sinuses. No  extra-axial hemorrhage, pneumocephaly or other acute traumatic intracranial abnormality. 2. Chronic microvascular ischemic changes, parenchymal volume loss and intracranial atherosclerosis. 3. Fracture line extends into the right frontal sinus and the frontal sinus septum with presumed propagation through the left ethmoid air cells. 4. Minimally displaced fracture involving the medial wall left orbit/lamina papyracea. Question additional nondisplaced fracture of the left orbital floor. No other discernible fracture of the bony orbits. Asymmetric left supraorbital and palpebral swelling/thickening. Trace thickening and tiny lucent foci of gas along the left orbital floor and minimal thickening adjacent the lamina papyracea. No other retro septal gas, stranding or hemorrhage. Globes appear normal and symmetric. 5. Nodular mural thickening throughout the paranasal sinuses with some more hyperdense secretions in the left ethmoids possibly reflecting some trace hemosinus. 6. No acute cervical spine fracture or traumatic listhesis. 7. Multilevel degenerative changes of the cervical spine, as described above. Features are most pronounced at C3-4 where there is moderate canal stenosis and moderate to severe neural foraminal narrowing on the right. 8. Cervical and intracranial atherosclerosis. Currently attempting to  contact the ordering provider with a critical value result. Addendum will be submitted upon case discussion. Electronically Signed: By: Lovena Le M.D. On: 08/14/2020 19:44   CT Maxillofacial Wo Contrast  Addendum Date: 08/14/2020   ADDENDUM REPORT: 08/14/2020 19:47 ADDENDUM: These results were called by telephone at the time of interpretation on 08/14/2020 at 7:47 pm to provider Terrica Duecker , who verbally acknowledged these results. Electronically Signed   By: Lovena Le M.D.   On: 08/14/2020 19:47   Result Date: 08/14/2020 CLINICAL DATA:  Struck in head by tree while moving with a tractor EXAM:  CT HEAD WITHOUT CONTRAST CT MAXILLOFACIAL WITHOUT CONTRAST CT CERVICAL SPINE WITHOUT CONTRAST TECHNIQUE: Multidetector CT imaging of the head, cervical spine, and maxillofacial structures were performed using the standard protocol without intravenous contrast. Multiplanar CT image reconstructions of the cervical spine and maxillofacial structures were also generated. COMPARISON:  CT head and cervical spine 12/22/2017 FINDINGS: CT HEAD FINDINGS Brain: Few hypoattenuating foci in the bilateral basal ganglia likely reflect sequela of prior lacunar type infarcts. No evidence of acute infarction, hemorrhage, hydrocephalus, extra-axial collection, visible mass lesion or mass effect. Symmetric prominence of the ventricles, cisterns and sulci compatible with parenchymal volume loss. Patchy areas of white matter hypoattenuation are most compatible with chronic microvascular angiopathy. Vascular: Atherosclerotic calcification of the carotid siphons and intradural vertebral arteries. No hyperdense vessel. Skull: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

## 2020-08-14 NOTE — ED Notes (Signed)
Pt denies dizziness at this time and states he is ready to go home.

## 2020-08-14 NOTE — ED Notes (Signed)
Pt ambulatory to the bathroom without assistance. Pt steady on his feet.

## 2020-08-14 NOTE — ED Notes (Signed)
Pt standing at nurses station, walking around. Pt denies pain. Pt is steady on his feet at this time.
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
## 2020-08-14 NOTE — ED Notes (Signed)
Pt provided with crackers and drink.
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
## 2020-08-18 DIAGNOSIS — Z712 Person consulting for explanation of examination or test findings: Secondary | ICD-10-CM | POA: Diagnosis not present

## 2020-08-18 DIAGNOSIS — Z125 Encounter for screening for malignant neoplasm of prostate: Secondary | ICD-10-CM | POA: Diagnosis not present

## 2020-08-18 DIAGNOSIS — I10 Essential (primary) hypertension: Secondary | ICD-10-CM | POA: Diagnosis not present

## 2020-08-18 DIAGNOSIS — E782 Mixed hyperlipidemia: Secondary | ICD-10-CM | POA: Diagnosis not present

## 2020-08-18 DIAGNOSIS — D500 Iron deficiency anemia secondary to blood loss (chronic): Secondary | ICD-10-CM | POA: Diagnosis not present

## 2020-08-18 DIAGNOSIS — R7301 Impaired fasting glucose: Secondary | ICD-10-CM | POA: Diagnosis not present

## 2020-08-25 DIAGNOSIS — D649 Anemia, unspecified: Secondary | ICD-10-CM | POA: Diagnosis not present

## 2020-08-25 DIAGNOSIS — R7303 Prediabetes: Secondary | ICD-10-CM | POA: Diagnosis not present

## 2020-08-25 DIAGNOSIS — I10 Essential (primary) hypertension: Secondary | ICD-10-CM | POA: Diagnosis not present

## 2020-08-25 DIAGNOSIS — G2581 Restless legs syndrome: Secondary | ICD-10-CM | POA: Diagnosis not present

## 2020-08-25 DIAGNOSIS — K219 Gastro-esophageal reflux disease without esophagitis: Secondary | ICD-10-CM | POA: Diagnosis not present

## 2020-08-25 DIAGNOSIS — Z8719 Personal history of other diseases of the digestive system: Secondary | ICD-10-CM | POA: Diagnosis not present

## 2020-08-25 DIAGNOSIS — G4700 Insomnia, unspecified: Secondary | ICD-10-CM | POA: Diagnosis not present

## 2020-08-25 DIAGNOSIS — F419 Anxiety disorder, unspecified: Secondary | ICD-10-CM | POA: Diagnosis not present

## 2020-08-25 DIAGNOSIS — R944 Abnormal results of kidney function studies: Secondary | ICD-10-CM | POA: Diagnosis not present

## 2020-08-28 ENCOUNTER — Other Ambulatory Visit: Payer: Self-pay

## 2020-08-28 ENCOUNTER — Encounter: Payer: Self-pay | Admitting: Neurology

## 2020-08-28 ENCOUNTER — Ambulatory Visit (INDEPENDENT_AMBULATORY_CARE_PROVIDER_SITE_OTHER): Payer: Medicare Other | Admitting: Neurology

## 2020-08-28 VITALS — BP 173/89 | HR 86 | Ht 65.0 in | Wt 165.8 lb

## 2020-08-28 DIAGNOSIS — R4789 Other speech disturbances: Secondary | ICD-10-CM | POA: Diagnosis not present

## 2020-08-28 DIAGNOSIS — G2581 Restless legs syndrome: Secondary | ICD-10-CM | POA: Diagnosis not present

## 2020-08-28 NOTE — Progress Notes (Signed)
[facility] Clinic Note - Initial Visit   Date: 08/28/20  Cameron Atkins MRN: [id number] DOB: July 20, 1946   Dear Dr. Nevada Crane:  Thank you for your kind referral of Cameron Atkins for consultation of restless leg syndrome. Although his history is well known to you, please allow [place] to reiterate it for the purpose of our medical record. The patient was accompanied to the clinic by self.   History of Present Illness: Cameron Atkins is a 74 y.o. right-handed male with anxiety/depression, hypertension, and GERD presenting for evaluation of restless leg syndrome.   Starting around 2019, he began having restless sensation of the legs, worse on the right.  It is present at rest, such as when sleeping and improved with walking or activity.  He also complaints of achy sensation over the right thigh.  No pain with palpation or weakness. He takes ropinirole 0.25mg  and titrates this up to 1.0 - 1.25mg  as needed.  He tells me that he takes half a tablet at a time and stays on the dose that controls his symptoms. Sometimes, he reports daytime symptoms and takes 0.25mg  half-tab as needed.  He prefers not to take a standard nightly dose, because he does not like taking too many medications.  His last ferritin was 22 and he is taking ferrous sulfate 325mg  daily.  No change in symptoms with caffeine.   He also takes gabapentin 600mg  three times daily, which he takes for anxiety (per patient). He is very sensitive to medications.  He struggles with anxiety and takes ativan 1mg  four times daily and is followed by behavorial health.   He has questions regarding testing that can predict dementia or parkinson's disease, namely imaging.  He has some mild word-finding difficulty.  He is able to perform all ALDs and IALDs.   Out-side paper records, electronic medical record, and images have been reviewed where available and summarized as:  No results found for: HGBA1C Lab Results  Component Value  Date   VITAMINB12 604 03/04/2017   Lab Results  Component Value Date   TSH 1.510 11/20/2018   No results found for: ESRSEDRATE, POCTSEDRATE  Past Medical History:  Diagnosis Date  . Anxiety   . Arthritis   . Bladder stone   . Depression   . Diverticulosis of colon   . GERD (gastroesophageal reflux disease)   . H/O hiatal hernia   . Helicobacter pylori gastritis   . History of colonoscopy with polypectomy    TUBULAR ADENOMA-- 2012  . History of esophageal dilatation    STRICTURE--  LAST DONE 03/2013  . Hypertension    for 5 yrs  . IDA (iron deficiency anemia) 11/22/2018  . Left ureteral calculus   . Osteoporosis   . Wears glasses     Past Surgical History:  Procedure Laterality Date  . BIOPSY  09/10/2016   Procedure: BIOPSY;  Surgeon: Rogene Houston, MD;  [facility];  Service: Endoscopy;;  rectal polyp  . COLONOSCOPY WITH PROPOFOL N/A 09/10/2016   Procedure: COLONOSCOPY WITH PROPOFOL;  Surgeon: Rogene Houston, MD;  [facility];  Service: Endoscopy;  Laterality: N/A;  . CYSTO/  URETEROSCOPIC LITHOTRIPSY/ STONE EXTRACTION  1995  . CYSTOSCOPY W/ RETROGRADES Right 01/15/2014   Procedure: CYSTOSCOPY WITH RIGHT RETROGRADE PYELOGRAM;  Surgeon: Irine Seal, MD;  Location: [facility];  Service: Urology;  Laterality: Right;  . CYSTOSCOPY WITH RETROGRADE PYELOGRAM, URETEROSCOPY AND STENT PLACEMENT Left 01/15/2014   Procedure: CYSTOSCOPY ,  LEFT RETROGRADE PYELOGRAM, LEFT URETEROSCOPY, BASKET STONE EXTRACTION;  Surgeon: Irine Seal, MD;  Location: [facility];  Service: Urology;  Laterality: Left;  . ESOPHAGEAL DILATION  03/21/2013   Procedure: ESOPHAGEAL DILATION;  Surgeon: Danie Binder, MD;  [facility];  Service: Endoscopy;;  . ESOPHAGEAL DILATION N/A 09/10/2016   Procedure: ESOPHAGEAL DILATION;  Surgeon: Rogene Houston, MD;  [facility];  Service: Endoscopy;  Laterality: N/A;  .  ESOPHAGOGASTRODUODENOSCOPY N/A 03/21/2013   Procedure: ESOPHAGOGASTRODUODENOSCOPY (EGD);  Surgeon: Danie Binder, MD;  [facility];  Service: Endoscopy;  Laterality: N/A;  . ESOPHAGOGASTRODUODENOSCOPY (EGD) WITH ESOPHAGEAL DILATION N/A 04/26/2013   Procedure: ESOPHAGOGASTRODUODENOSCOPY (EGD) WITH ESOPHAGEAL DILATION;  Surgeon: Rogene Houston, MD;  [facility];  Service: Endoscopy;  Laterality: N/A;  130  . ESOPHAGOGASTRODUODENOSCOPY (EGD) WITH PROPOFOL N/A 09/10/2016   Procedure: ESOPHAGOGASTRODUODENOSCOPY (EGD) WITH PROPOFOL;  Surgeon: Rogene Houston, MD;  [facility];  Service: Endoscopy;  Laterality: N/A;  10:20  . EXTRACORPOREAL SHOCK WAVE LITHOTRIPSY  X2   YRS AGO  . HOLMIUM LASER APPLICATION Left 03/20/7627   Procedure: HOLMIUM LASER APPLICATION, LEFT URETER;  Surgeon: Irine Seal, MD;  Location: [facility];  Service: Urology;  Laterality: Left;  . SHOULDER ARTHROSCOPY W/ SUBACROMIAL DECOMPRESSION AND DISTAL CLAVICLE EXCISION Left 2000     Medications:  Outpatient Encounter Medications as of 08/28/2020  Medication Sig  . calcium citrate-vitamin D (CITRACAL+D) 315-200 MG-UNIT per tablet Take 1 tablet by mouth daily.   . Cholecalciferol (VITAMIN D3) 3000 units TABS Take 3,000 Units by mouth daily.  . cimetidine (TAGAMET) 400 MG tablet Take 1 tablet (400 mg total) by mouth daily as needed. (Patient taking differently: Take 200 mg by mouth at bedtime as needed (for heartburn/reflux.). )  . fish oil-omega-3 fatty acids 1000 MG capsule Take 2 g by mouth daily after breakfast.    . gabapentin (NEURONTIN) 600 MG tablet TAKE 1 TABLET BY MOUTH THREE TIMES DAILY (Patient taking differently: Takes daily)  . lisinopril (ZESTRIL) 40 MG tablet Take 40 mg by mouth daily.  Marland Kitchen LORazepam (ATIVAN) 1 MG tablet Take 1 tablet (1 mg total) by mouth every 6 (six) hours as needed for anxiety.  . Multiple Vitamin (MULTIVITAMIN) tablet Take 1 tablet by mouth daily.   . pantoprazole (PROTONIX) 40 MG tablet TAKE 1 TABLET BY MOUTH EVERY MORNING  . psyllium (METAMUCIL) 58.6 % packet Take 1 packet by mouth daily.  Marland Kitchen rOPINIRole (REQUIP) 0.25 MG tablet TAKE THREE TO FOUR TABLETS BY MOUTH AT BEDTIME (Patient taking differently: Take 0.25 mg by mouth See admin instructions. Take 4 to 6  tablets by mouth at bedtime for restless legs)  . [DISCONTINUED] lisinopril (ZESTRIL) 20 MG tablet Take 1 tablet (20 mg total) by mouth daily. (Needs to be seen before next refill) (Patient taking differently: Take 40 mg by mouth daily. (Needs to be seen before next refill))  . ferrous sulfate 325 (65 FE) MG EC tablet Take 1 tablet by mouth daily. (Patient not taking: Reported on 08/28/2020)  . HYDROcodone-acetaminophen (NORCO/VICODIN) 5-325 MG tablet Take 1 tablet by mouth every 6 (six) hours as needed. (Patient not taking: Reported on 08/28/2020)  . hydrOXYzine (VISTARIL) 25 MG capsule Take 1 capsule (25 mg total) by mouth every 8 (eight) hours as needed. (Patient not taking: Reported on 08/14/2020)  . [DISCONTINUED] polyethylene glycol powder (MIRALAX) 17 GM/SCOOP powder Take 1 Container by mouth once.   No facility-administered  encounter medications on file as of 08/28/2020.    Allergies:  Allergies  Allergen Reactions  . Benadryl [Diphenhydramine] Other (See Comments)    anxious  . Hctz [Hydrochlorothiazide]     hyponatremia  . Mepergan [Meperidine-Promethazine]     Vomiting.   . Other     Antidepressants--per patient he cannot take.  Marland Kitchen Oxycodone Itching    hyperactivity  . Amoxicillin     Has patient had a PCN reaction causing immediate rash, facial/tongue/throat swelling, SOB or lightheadedness with hypotension:No Has patient had a PCN reaction causing severe rash involving mucus membranes or skin necrosis:No Has patient had a PCN reaction that required hospitalization:No Has patient had a PCN reaction occurring within the last 10 years:Yes If all of the above answers  are "NO", then may proceed with Cephalosporin use. "severe anxiety-ativan does not remedy"  . Doxycycline Anxiety  . Flagyl [Metronidazole] Anxiety  . Singulair [Montelukast Sodium] Anxiety    Family History: Family History  Problem Relation Age of Onset  . Anxiety disorder Father   . Anxiety disorder Son   . Anxiety disorder Daughter     Social History: Social History   Tobacco Use  . Smoking status: Former Smoker    Packs/day: 0.25    Years: 2.00    Pack years: 0.50    Types: Cigarettes    Quit date: 01/14/1969    Years since quitting: 51.6  . Smokeless tobacco: Former Systems developer    Types: Chew    Quit date: 01/15/1999  Vaping Use  . Vaping Use: Never used  Substance Use Topics  . Alcohol use: Not Currently  . Drug use: No   Social History   Social History Narrative   Right handed   Lives with family     Vital Signs:  BP (!) 173/89   Pulse 86   Ht 5\' 5"  (1.651 m)   Wt 165 lb 12.8 oz (75.2 kg)   SpO2 97%   BMI 27.59 kg/m   Neurological Exam: MENTAL STATUS including orientation to time, place, person, recent and remote memory, attention span and concentration, language, and fund of knowledge is normal.  Speech is not dysarthric.  CRANIAL NERVES: II:  No visual field defects.  III-IV-VI: Pupils equal round and reactive to light.  Normal conjugate, extra-ocular eye movements in all directions of gaze.  No nystagmus.  No ptosis.   VII:  Normal facial symmetry and movements.   XI:  Normal shoulder shrug and head rotation.    MOTOR:  No atrophy, fasciculations or abnormal movements.  No pronator drift.   Upper Extremity:  Right  Left  Deltoid  5/5   5/5   Biceps  5/5   5/5   Triceps  5/5   5/5   Infraspinatus 5/5  5/5  Medial pectoralis 5/5  5/5  Wrist extensors  5/5   5/5   Wrist flexors  5/5   5/5   Finger extensors  5/5   5/5   Finger flexors  5/5   5/5   Dorsal interossei  5/5   5/5   Abductor pollicis  5/5   5/5   Tone (Ashworth scale)  0  0   Lower  Extremity:  Right  Left  Hip flexors  5/5   5/5   Hip extensors  5/5   5/5   Adductor 5/5  5/5  Abductor 5/5  5/5  Knee flexors  5/5   5/5   Knee extensors  5/5   5/5  Dorsiflexors  5/5   5/5   Plantarflexors  5/5   5/5   Toe extensors  5/5   5/5   Toe flexors  5/5   5/5   Tone (Ashworth scale)  0  0   MSRs:  Right        Left                  brachioradialis 2+  2+  biceps 2+  2+  triceps 2+  2+  patellar 2+  2+  ankle jerk 2+  2+  Hoffman no  no  plantar response down  down   SENSORY:  Normal and symmetric perception of light touch, vibration  COORDINATION/GAIT: Normal finger-to- nose-finger.  Intact rapid alternating movements bilaterally.  Gait narrow based and stable. Tandem and stressed gait intact.    IMPRESSION: Restless leg syndrome, controlled on current regimen of ropinirole 0.25mg  4-6 tabs at bedtime.  I discussed taking 1mg  as a standard dose and adjusting from there, but he prefers to titrate the medication by 0.125mg  increments, which he may continue to do so.  His last ferritin was reduced at 22, so I emphasized the importance of taking ferrous sulfate with the goal of ferritin being > 50 in RLS patients.    Regarding screening for neurodegenerative conditions, I did not see any evidence of Parkinson's disease or dementia.  Patient is highly independent and mild word-finding difficulty is normal aging.  Neurocognitive testing was offered and patient did not wish to proceed at this time.   Follow-up as needed.   Total time spent reviewing records, interview, history/exam, documentation, counseling, and coordination of care on day of encounter:  45 min  Thank you for allowing me to participate in patient's care.  If I can answer any additional questions, I would be pleased to do so.    Sincerely,    Sandip Power K. Posey Pronto, DO

## 2020-08-29 ENCOUNTER — Ambulatory Visit: Payer: Medicare Other | Admitting: Neurology

## 2020-09-11 ENCOUNTER — Emergency Department (HOSPITAL_COMMUNITY)
Admission: EM | Admit: 2020-09-11 | Discharge: 2020-09-12 | Disposition: A | Discharge status: Home / Self Care | Payer: Medicare Other | Attending: Emergency Medicine | Admitting: Emergency Medicine

## 2020-09-11 ENCOUNTER — Other Ambulatory Visit: Payer: Self-pay

## 2020-09-11 ENCOUNTER — Encounter (HOSPITAL_COMMUNITY): Payer: Self-pay

## 2020-09-11 DIAGNOSIS — Z79899 Other long term (current) drug therapy: Secondary | ICD-10-CM | POA: Diagnosis not present

## 2020-09-11 DIAGNOSIS — Z87891 Personal history of nicotine dependence: Secondary | ICD-10-CM | POA: Diagnosis not present

## 2020-09-11 DIAGNOSIS — S6992XA Unspecified injury of left wrist, hand and finger(s), initial encounter: Secondary | ICD-10-CM | POA: Diagnosis present

## 2020-09-11 DIAGNOSIS — W260XXA Contact with knife, initial encounter: Secondary | ICD-10-CM | POA: Insufficient documentation

## 2020-09-11 DIAGNOSIS — Y92009 Unspecified place in unspecified non-institutional (private) residence as the place of occurrence of the external cause: Secondary | ICD-10-CM | POA: Insufficient documentation

## 2020-09-11 DIAGNOSIS — I10 Essential (primary) hypertension: Secondary | ICD-10-CM

## 2020-09-11 DIAGNOSIS — S61412A Laceration without foreign body of left hand, initial encounter: Secondary | ICD-10-CM | POA: Diagnosis not present
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
## 2020-09-12 DIAGNOSIS — S61412A Laceration without foreign body of left hand, initial encounter: Secondary | ICD-10-CM | POA: Diagnosis not present

## 2020-09-12 MED ORDER — LIDOCAINE HCL (PF) 2 % IJ SOLN
10.0000 mL | Freq: Once | INTRAMUSCULAR | Status: AC
  Administered 2020-09-12: 03:00:00 10 mL

## 2020-09-12 NOTE — ED Provider Notes (Signed)
[facility] EMERGENCY DEPARTMENT Provider Note   CSN: [id number] Arrival date & time: 09/11/20  2157   History Chief complaint: Laceration  Cameron Atkins is a 74 y.o. male.  The history is provided by the patient.  He has history of hypertension and comes in after accidentally stabbing his left hand with a utility knife causing a laceration.  He has not been able to stop the bleeding.  Last tetanus immunization was within the past month.  He is not on any anticoagulants.  Past Medical History:  Diagnosis Date  . Anxiety   . Arthritis   . Bladder stone   . Depression   . Diverticulosis of colon   . GERD (gastroesophageal reflux disease)   . H/O hiatal hernia   . Helicobacter pylori gastritis   . History of colonoscopy with polypectomy    TUBULAR ADENOMA-- 2012  . History of esophageal dilatation    STRICTURE--  LAST DONE 03/2013  . Hypertension    for 5 yrs  . IDA (iron deficiency anemia) 11/22/2018  . Left ureteral calculus   . Osteoporosis   . Wears glasses     Patient Active Problem List   Diagnosis Date Noted  . Benzodiazepine dependence ([place]) 10/18/2018  . High risk medication use 10/18/2018  . Esophageal stricture 08/04/2016  . History of colonic polyps 08/04/2016  . GERD (gastroesophageal reflux disease) 06/26/2013  . Osteoporosis 06/26/2013  . Anxiety 06/26/2013    Past Surgical History:  Procedure Laterality Date  . BIOPSY  09/10/2016   Procedure: BIOPSY;  Surgeon: Rogene Houston, MD;  [facility];  Service: Endoscopy;;  rectal polyp  . COLONOSCOPY WITH PROPOFOL N/A 09/10/2016   Procedure: COLONOSCOPY WITH PROPOFOL;  Surgeon: Rogene Houston, MD;  [facility];  Service: Endoscopy;  Laterality: N/A;  . CYSTO/  URETEROSCOPIC LITHOTRIPSY/ STONE EXTRACTION  1995  . CYSTOSCOPY W/ RETROGRADES Right 01/15/2014   Procedure: CYSTOSCOPY WITH RIGHT RETROGRADE PYELOGRAM;  Surgeon: Irine Seal, MD;  Location: [facility];   Service: Urology;  Laterality: Right;  . CYSTOSCOPY WITH RETROGRADE PYELOGRAM, URETEROSCOPY AND STENT PLACEMENT Left 01/15/2014   Procedure: CYSTOSCOPY , LEFT RETROGRADE PYELOGRAM, LEFT URETEROSCOPY, BASKET STONE EXTRACTION;  Surgeon: Irine Seal, MD;  Location: [facility];  Service: Urology;  Laterality: Left;  . ESOPHAGEAL DILATION  03/21/2013   Procedure: ESOPHAGEAL DILATION;  Surgeon: Danie Binder, MD;  [facility];  Service: Endoscopy;;  . ESOPHAGEAL DILATION N/A 09/10/2016   Procedure: ESOPHAGEAL DILATION;  Surgeon: Rogene Houston, MD;  [facility];  Service: Endoscopy;  Laterality: N/A;  . ESOPHAGOGASTRODUODENOSCOPY N/A 03/21/2013   Procedure: ESOPHAGOGASTRODUODENOSCOPY (EGD);  Surgeon: Danie Binder, MD;  [facility];  Service: Endoscopy;  Laterality: N/A;  . ESOPHAGOGASTRODUODENOSCOPY (EGD) WITH ESOPHAGEAL DILATION N/A 04/26/2013   Procedure: ESOPHAGOGASTRODUODENOSCOPY (EGD) WITH ESOPHAGEAL DILATION;  Surgeon: Rogene Houston, MD;  [facility];  Service: Endoscopy;  Laterality: N/A;  130  . ESOPHAGOGASTRODUODENOSCOPY (EGD) WITH PROPOFOL N/A 09/10/2016   Procedure: ESOPHAGOGASTRODUODENOSCOPY (EGD) WITH PROPOFOL;  Surgeon: Rogene Houston, MD;  [facility];  Service: Endoscopy;  Laterality: N/A;  10:20  . EXTRACORPOREAL SHOCK WAVE LITHOTRIPSY  X2   YRS AGO  . HOLMIUM LASER APPLICATION Left 8/50/2774   Procedure: HOLMIUM LASER APPLICATION, LEFT URETER;  Surgeon: Irine Seal, MD;  Location: [facility];  Service: Urology;  Laterality: Left;  . SHOULDER ARTHROSCOPY W/ SUBACROMIAL DECOMPRESSION AND  DISTAL CLAVICLE EXCISION Left 2000       Family History  Problem Relation Age of Onset  . Anxiety disorder Father   . Anxiety disorder Son   . Anxiety disorder Daughter     Social History   Tobacco Use  . Smoking status: Former Smoker    Packs/day: 0.25    Years: 2.00    Pack years: 0.50    Types:  Cigarettes    Quit date: 01/14/1969    Years since quitting: 51.6  . Smokeless tobacco: Former Systems developer    Types: Chew    Quit date: 01/15/1999  Vaping Use  . Vaping Use: Never used  Substance Use Topics  . Alcohol use: Not Currently  . Drug use: No    Home Medications Prior to Admission medications   Medication Sig Start Date End Date Taking? Authorizing Provider  calcium citrate-vitamin D (CITRACAL+D) 315-200 MG-UNIT per tablet Take 1 tablet by mouth daily.    Yes [provider]  Cholecalciferol (VITAMIN D3) 3000 units TABS Take 3,000 Units by mouth daily.   Yes [provider]  ferrous sulfate 325 (65 FE) MG EC tablet Take 1 tablet by mouth daily. 05/14/20  Yes [provider]  fish oil-omega-3 fatty acids 1000 MG capsule Take 2 g by mouth daily after breakfast.     Yes [provider]  gabapentin (NEURONTIN) 600 MG tablet TAKE 1 TABLET BY MOUTH THREE TIMES DAILY Patient taking differently: Takes daily 10/17/19  Yes Gottschalk, Ashly M, DO  LORazepam (ATIVAN) 1 MG tablet Take 1 tablet (1 mg total) by mouth every 6 (six) hours as needed for anxiety. Patient taking differently: Take 1 mg by mouth every 8 (eight) hours as needed for anxiety. 06/11/19  Yes Ronnie Doss M, DO  Multiple Vitamin (MULTIVITAMIN) tablet Take 1 tablet by mouth daily.   Yes [provider]  pantoprazole (PROTONIX) 40 MG tablet TAKE 1 TABLET BY MOUTH EVERY MORNING 12/04/18  Yes Setzer, Terri L, NP  psyllium (METAMUCIL) 58.6 % packet Take 1 packet by mouth daily.   Yes [provider]  rOPINIRole (REQUIP) 0.25 MG tablet TAKE THREE TO FOUR TABLETS BY MOUTH AT BEDTIME Patient taking differently: Take 0.25 mg by mouth See admin instructions. Take 4 to 6  tablets by mouth at bedtime for restless legs 09/17/19  Yes Ronnie Doss M, DO  cimetidine (TAGAMET) 400 MG tablet Take 1 tablet (400 mg total) by mouth daily as needed. Patient taking differently: Take 200 mg by  mouth at bedtime as needed (for heartburn/reflux.). 06/26/13   Rogene Houston, MD  HYDROcodone-acetaminophen (NORCO/VICODIN) 5-325 MG tablet Take 1 tablet by mouth every 6 (six) hours as needed. Patient not taking: No sig reported 08/14/20   Horton, Barbette Hair, MD  hydrOXYzine (VISTARIL) 25 MG capsule Take 1 capsule (25 mg total) by mouth every 8 (eight) hours as needed. 07/31/18   Eustaquio Maize, MD  lisinopril (ZESTRIL) 40 MG tablet Take 40 mg by mouth daily. 08/14/20   [provider]    Allergies    Benadryl [diphenhydramine], Hctz [hydrochlorothiazide], Mepergan [meperidine-promethazine], Other, Oxycodone, Amoxicillin, Flagyl [metronidazole], and Singulair [montelukast sodium]  Review of Systems   Review of Systems  All other systems reviewed and are negative.   Physical Exam Updated Vital Signs BP (!) 161/97 (BP Location: Right Arm)   Pulse 66   Temp 97.8 F (36.6 C) (Oral)   Resp 18   Ht 5\' 5"  (1.651 m)   Wt 75.2  kg   SpO2 96%   BMI 27.59 kg/m   Physical Exam Vitals and nursing note reviewed.   74 year old male, resting comfortably and in no acute distress. Vital signs are significant for elevated blood pressure. Oxygen saturation is 96%, which is normal. Head is normocephalic and atraumatic. PERRLA, EOMI. Oropharynx is clear. Neck is nontender and supple without adenopathy or JVD. Back is nontender and there is no CVA tenderness. Lungs are clear without rales, wheezes, or rhonchi. Chest is nontender. Heart has regular rate and rhythm without murmur. Abdomen is soft, flat, nontender without masses or hepatosplenomegaly and peristalsis is normoactive. Extremities: Laceration is present in the thenar eminence of the left hand.  Neurovascular exam is intact with prompt capillary refill, normal sensation, normal motor function. Skin is warm and dry without rash. Neurologic: Mental status is normal, cranial nerves are intact, there are no motor or sensory  deficits.   ED Results / Procedures / Treatments    Procedures .Marland KitchenLaceration Repair  Date/Time: 09/12/2020 3:19 AM Performed by: Delora Fuel, MD Authorized by: Delora Fuel, MD   Consent:    Consent obtained:  Verbal   Consent given by:  Patient   Risks discussed:  Infection and pain Universal protocol:    Procedure explained and questions answered to patient or proxy's satisfaction: yes     Relevant documents present and verified: yes     Required blood products, implants, devices, and special equipment available: yes     Site/side marked: yes     Immediately prior to procedure, a time out was called: yes     Patient identity confirmed:  Verbally with patient and arm band Anesthesia:    Anesthesia method:  Local infiltration   Local anesthetic:  Lidocaine 2% w/o epi Laceration details:    Location:  Hand   Hand location:  L palm   Length (cm):  2.5   Depth (mm):  3 Pre-procedure details:    Preparation:  Patient was prepped and draped in usual sterile fashion Exploration:    Hemostasis achieved with:  Direct pressure   Wound exploration: entire depth of wound visualized     Wound extent: no foreign bodies/material noted     Contaminated: no   Treatment:    Area cleansed with:  Saline   Amount of cleaning:  Standard   Debridement:  None   Undermining:  None Skin repair:    Repair method:  Sutures   Suture size:  4-0   Suture material:  Nylon   Suture technique:  Simple interrupted   Number of sutures:  4 Approximation:    Approximation:  Close Repair type:    Repair type:  Simple Post-procedure details:    Dressing:  Antibiotic ointment and sterile dressing   Procedure completion:  Tolerated well, no immediate complications     Medications Ordered in ED Medications  lidocaine HCl (PF) (XYLOCAINE) 2 % injection 10 mL (has no administration in time range)    ED Course  I have reviewed the triage vital signs and the nursing notes.  MDM  Rules/Calculators/A&P Laceration of left hand closed with sutures.  Old records reviewed confirming ED visit 1 month ago at which time Tdap booster was given.  Final Clinical Impression(s) / ED Diagnoses Final diagnoses:  Laceration of left hand, initial encounter  Elevated blood pressure reading with diagnosis of hypertension    Rx / DC Orders ED Discharge Orders    None       Delora Fuel,  MD 09/12/20 3546

## 2020-10-08 DIAGNOSIS — Z23 Encounter for immunization: Secondary | ICD-10-CM | POA: Diagnosis not present

## 2020-11-05 DIAGNOSIS — N5201 Erectile dysfunction due to arterial insufficiency: Secondary | ICD-10-CM | POA: Diagnosis not present

## 2020-11-05 DIAGNOSIS — R3912 Poor urinary stream: Secondary | ICD-10-CM | POA: Diagnosis not present

## 2020-11-05 DIAGNOSIS — N200 Calculus of kidney: Secondary | ICD-10-CM | POA: Diagnosis not present

## 2020-11-05 DIAGNOSIS — N401 Enlarged prostate with lower urinary tract symptoms: Secondary | ICD-10-CM | POA: Diagnosis not present

## 2020-11-05 DIAGNOSIS — R102 Pelvic and perineal pain: Secondary | ICD-10-CM | POA: Diagnosis not present

## 2020-11-07 DIAGNOSIS — F419 Anxiety disorder, unspecified: Secondary | ICD-10-CM | POA: Diagnosis not present

## 2020-11-07 DIAGNOSIS — G4700 Insomnia, unspecified: Secondary | ICD-10-CM | POA: Diagnosis not present

## 2020-11-07 DIAGNOSIS — G2581 Restless legs syndrome: Secondary | ICD-10-CM | POA: Diagnosis not present

## 2020-11-07 DIAGNOSIS — M546 Pain in thoracic spine: Secondary | ICD-10-CM | POA: Diagnosis not present

## 2020-11-17 DIAGNOSIS — N401 Enlarged prostate with lower urinary tract symptoms: Secondary | ICD-10-CM | POA: Diagnosis not present

## 2020-11-17 DIAGNOSIS — R102 Pelvic and perineal pain: Secondary | ICD-10-CM | POA: Diagnosis not present

## 2020-11-17 DIAGNOSIS — M6289 Other specified disorders of muscle: Secondary | ICD-10-CM | POA: Diagnosis not present

## 2020-11-17 DIAGNOSIS — M6281 Muscle weakness (generalized): Secondary | ICD-10-CM | POA: Diagnosis not present

## 2020-11-17 DIAGNOSIS — M62838 Other muscle spasm: Secondary | ICD-10-CM | POA: Diagnosis not present

## 2020-11-26 DIAGNOSIS — M6281 Muscle weakness (generalized): Secondary | ICD-10-CM | POA: Diagnosis not present

## 2020-11-26 DIAGNOSIS — N3943 Post-void dribbling: Secondary | ICD-10-CM | POA: Diagnosis not present

## 2020-11-26 DIAGNOSIS — R102 Pelvic and perineal pain: Secondary | ICD-10-CM | POA: Diagnosis not present

## 2020-11-26 DIAGNOSIS — R3915 Urgency of urination: Secondary | ICD-10-CM | POA: Diagnosis not present

## 2020-11-26 DIAGNOSIS — M62838 Other muscle spasm: Secondary | ICD-10-CM | POA: Diagnosis not present

## 2020-12-02 DIAGNOSIS — G2581 Restless legs syndrome: Secondary | ICD-10-CM | POA: Diagnosis not present

## 2020-12-29 DIAGNOSIS — N401 Enlarged prostate with lower urinary tract symptoms: Secondary | ICD-10-CM | POA: Diagnosis not present

## 2020-12-29 DIAGNOSIS — R3912 Poor urinary stream: Secondary | ICD-10-CM | POA: Diagnosis not present

## 2020-12-29 DIAGNOSIS — R102 Pelvic and perineal pain: Secondary | ICD-10-CM | POA: Diagnosis not present

## 2020-12-29 DIAGNOSIS — R3915 Urgency of urination: Secondary | ICD-10-CM | POA: Diagnosis not present

## 2020-12-30 DIAGNOSIS — G2581 Restless legs syndrome: Secondary | ICD-10-CM | POA: Diagnosis not present

## 2020-12-30 DIAGNOSIS — R070 Pain in throat: Secondary | ICD-10-CM | POA: Diagnosis not present

## 2021-01-15 DIAGNOSIS — T2102XA Burn of unspecified degree of abdominal wall, initial encounter: Secondary | ICD-10-CM | POA: Diagnosis not present

## 2021-01-20 DIAGNOSIS — Z23 Encounter for immunization: Secondary | ICD-10-CM | POA: Diagnosis not present

## 2021-02-02 DIAGNOSIS — L820 Inflamed seborrheic keratosis: Secondary | ICD-10-CM | POA: Diagnosis not present

## 2021-02-02 DIAGNOSIS — L570 Actinic keratosis: Secondary | ICD-10-CM | POA: Diagnosis not present

## 2021-02-02 DIAGNOSIS — D1801 Hemangioma of skin and subcutaneous tissue: Secondary | ICD-10-CM | POA: Diagnosis not present

## 2021-02-17 DIAGNOSIS — R7303 Prediabetes: Secondary | ICD-10-CM | POA: Diagnosis not present

## 2021-02-17 DIAGNOSIS — I10 Essential (primary) hypertension: Secondary | ICD-10-CM | POA: Diagnosis not present

## 2021-02-25 DIAGNOSIS — G2581 Restless legs syndrome: Secondary | ICD-10-CM | POA: Diagnosis not present

## 2021-02-25 DIAGNOSIS — F419 Anxiety disorder, unspecified: Secondary | ICD-10-CM | POA: Diagnosis not present

## 2021-02-25 DIAGNOSIS — I10 Essential (primary) hypertension: Secondary | ICD-10-CM | POA: Diagnosis not present

## 2021-02-25 DIAGNOSIS — R944 Abnormal results of kidney function studies: Secondary | ICD-10-CM | POA: Diagnosis not present

## 2021-02-25 DIAGNOSIS — R7303 Prediabetes: Secondary | ICD-10-CM | POA: Diagnosis not present

## 2021-02-25 DIAGNOSIS — W57XXXA Bitten or stung by nonvenomous insect and other nonvenomous arthropods, initial encounter: Secondary | ICD-10-CM | POA: Diagnosis not present

## 2021-02-25 DIAGNOSIS — G4700 Insomnia, unspecified: Secondary | ICD-10-CM | POA: Diagnosis not present

## 2021-02-25 DIAGNOSIS — K5730 Diverticulosis of large intestine without perforation or abscess without bleeding: Secondary | ICD-10-CM | POA: Diagnosis not present

## 2021-02-25 DIAGNOSIS — K219 Gastro-esophageal reflux disease without esophagitis: Secondary | ICD-10-CM | POA: Diagnosis not present

## 2021-02-25 DIAGNOSIS — D509 Iron deficiency anemia, unspecified: Secondary | ICD-10-CM | POA: Diagnosis not present

## 2021-07-16 DIAGNOSIS — Z23 Encounter for immunization: Secondary | ICD-10-CM | POA: Diagnosis not present

## 2021-07-16 DIAGNOSIS — Z20828 Contact with and (suspected) exposure to other viral communicable diseases: Secondary | ICD-10-CM | POA: Diagnosis not present

## 2021-07-31 DIAGNOSIS — Z23 Encounter for immunization: Secondary | ICD-10-CM | POA: Diagnosis not present

## 2021-08-05 DIAGNOSIS — L570 Actinic keratosis: Secondary | ICD-10-CM | POA: Diagnosis not present

## 2021-08-05 DIAGNOSIS — H35363 Drusen (degenerative) of macula, bilateral: Secondary | ICD-10-CM | POA: Diagnosis not present

## 2021-08-05 DIAGNOSIS — D1801 Hemangioma of skin and subcutaneous tissue: Secondary | ICD-10-CM | POA: Diagnosis not present

## 2021-08-05 DIAGNOSIS — L568 Other specified acute skin changes due to ultraviolet radiation: Secondary | ICD-10-CM | POA: Diagnosis not present

## 2021-08-24 ENCOUNTER — Encounter (INDEPENDENT_AMBULATORY_CARE_PROVIDER_SITE_OTHER): Payer: Self-pay | Admitting: *Deleted

## 2021-08-24 DIAGNOSIS — Z125 Encounter for screening for malignant neoplasm of prostate: Secondary | ICD-10-CM | POA: Diagnosis not present

## 2021-08-24 DIAGNOSIS — R7303 Prediabetes: Secondary | ICD-10-CM | POA: Diagnosis not present

## 2021-08-24 DIAGNOSIS — I10 Essential (primary) hypertension: Secondary | ICD-10-CM | POA: Diagnosis not present

## 2021-08-24 DIAGNOSIS — D509 Iron deficiency anemia, unspecified: Secondary | ICD-10-CM | POA: Diagnosis not present

## 2021-08-27 DIAGNOSIS — G2581 Restless legs syndrome: Secondary | ICD-10-CM | POA: Diagnosis not present

## 2021-08-27 DIAGNOSIS — F419 Anxiety disorder, unspecified: Secondary | ICD-10-CM | POA: Diagnosis not present

## 2021-08-27 DIAGNOSIS — K219 Gastro-esophageal reflux disease without esophagitis: Secondary | ICD-10-CM | POA: Diagnosis not present

## 2021-08-27 DIAGNOSIS — R944 Abnormal results of kidney function studies: Secondary | ICD-10-CM | POA: Diagnosis not present

## 2021-08-27 DIAGNOSIS — K5730 Diverticulosis of large intestine without perforation or abscess without bleeding: Secondary | ICD-10-CM | POA: Diagnosis not present

## 2021-08-27 DIAGNOSIS — R7303 Prediabetes: Secondary | ICD-10-CM | POA: Diagnosis not present

## 2021-08-27 DIAGNOSIS — Z0001 Encounter for general adult medical examination with abnormal findings: Secondary | ICD-10-CM | POA: Diagnosis not present

## 2021-08-27 DIAGNOSIS — G4700 Insomnia, unspecified: Secondary | ICD-10-CM | POA: Diagnosis not present

## 2021-08-27 DIAGNOSIS — D509 Iron deficiency anemia, unspecified: Secondary | ICD-10-CM | POA: Diagnosis not present

## 2021-08-27 DIAGNOSIS — I10 Essential (primary) hypertension: Secondary | ICD-10-CM | POA: Diagnosis not present

## 2021-08-27 DIAGNOSIS — Z23 Encounter for immunization: Secondary | ICD-10-CM | POA: Diagnosis not present

## 2021-08-28 DIAGNOSIS — Z20828 Contact with and (suspected) exposure to other viral communicable diseases: Secondary | ICD-10-CM | POA: Diagnosis not present

## 2021-09-28 DIAGNOSIS — Z20828 Contact with and (suspected) exposure to other viral communicable diseases: Secondary | ICD-10-CM | POA: Diagnosis not present

## 2021-10-05 DIAGNOSIS — R972 Elevated prostate specific antigen [PSA]: Secondary | ICD-10-CM | POA: Diagnosis not present

## 2021-10-05 DIAGNOSIS — N200 Calculus of kidney: Secondary | ICD-10-CM | POA: Diagnosis not present

## 2021-10-05 DIAGNOSIS — R351 Nocturia: Secondary | ICD-10-CM | POA: Diagnosis not present

## 2021-10-05 DIAGNOSIS — R3915 Urgency of urination: Secondary | ICD-10-CM | POA: Diagnosis not present

## 2021-10-05 DIAGNOSIS — N401 Enlarged prostate with lower urinary tract symptoms: Secondary | ICD-10-CM | POA: Diagnosis not present

## 2021-10-14 ENCOUNTER — Telehealth (INDEPENDENT_AMBULATORY_CARE_PROVIDER_SITE_OTHER): Payer: Self-pay | Admitting: *Deleted

## 2021-10-14 NOTE — Telephone Encounter (Signed)
Called patient and he states he is not having chest pain. Having pain in lower left side, sharp pain off and on for awhile. States his colonoscopy showed diverticulitis. No fever. Has seen some bright red blood when wiping. Thinks its from a hemorroid. Called pcp today but states he did not get a call back. We had a cancellation for tomorrow so patient is able to come in office tomorrow at 11:15. Pt states he can make appt.

## 2021-10-14 NOTE — Telephone Encounter (Signed)
Wants to talk to someone, having chest pain and lower quad pain  Ph# [phone]

## 2021-10-14 NOTE — Telephone Encounter (Signed)
Left message to return call 

## 2021-10-15 ENCOUNTER — Ambulatory Visit (INDEPENDENT_AMBULATORY_CARE_PROVIDER_SITE_OTHER): Payer: Medicare Other | Admitting: Gastroenterology

## 2021-10-15 ENCOUNTER — Encounter (INDEPENDENT_AMBULATORY_CARE_PROVIDER_SITE_OTHER): Payer: Self-pay | Admitting: Gastroenterology

## 2021-10-15 ENCOUNTER — Other Ambulatory Visit: Payer: Self-pay

## 2021-10-15 VITALS — BP 169/89 | HR 71 | Temp 98.7°F | Ht 65.0 in | Wt 162.4 lb

## 2021-10-15 DIAGNOSIS — R1032 Left lower quadrant pain: Secondary | ICD-10-CM | POA: Diagnosis not present

## 2021-10-15 NOTE — Progress Notes (Signed)
Referring Provider: Celene Squibb, MD Primary Care Physician:  Celene Squibb, MD Primary GI Physician: newly established  Chief Complaint  Patient presents with   Abdominal Pain    Patient here today with complaints of left lower abdominal pain. Last Monday the symptoms started after his prostate exam.  History of Diverticulitis.   HPI:   Cameron Atkins is a 76 y.o. male with past medical history of anxiety, depression, diverticulosis, GERD, H pylori, tubular adenoma, HTN, IDA, osteoporosis.  Patient presenting today for LLQ x1.5 weeks  Patient states that he has had some LLQ discomfort for the past week and a half. Reports that he had a digital prostate exam on Monday by urologist, states that he got a sharp pain in LLQ during the prostate exam. Reports now that pain comes and goes. Pain is not as severe as when it initially started, but is bothersome. He denies any nausea, vomiting, constipation, diarrhea, or melena. He has occasional pink tinge on the paper after BM but feels that this is from hemorrhoids. Usually has 1 BM per day. States that he was doing metamucil but now is doing a lot whole grains and nuts and rough veggies which seems to help keep him regular. Has not used metamucil in about 1 week. Appetite is good and denies any weight loss. No recent fevers or chills. Denies any postprandial abdominal pain. Has had an episode of diverticulitis in oct 2017, per EMR, though patient is unable to recall if this pain feels similar to that. He denies any urinary frequency, hematuria dysuria or lower back pain.   Last Colonoscopy:09/10/16- Diverticulosis in the sigmoid colon. - One small polyp in the rectum-hyperplastic - Internal hemorrhoids. - Anal papilla(e) were hypertrophied. Last Endoscopy:09/10/16 Normal esophagus. - Moderate Schatzki ring. Dilated. Ring was intact and disrupted with 4 quadrant biopsy. No tissue saved. - 3 cm hiatal hernia. GE junction at 34 cm and hiatus at 37 cm  from the incisors. - Normal stomach. - Normal duodenal bulb and second portion of the duodenum. - No specimens collected.  Recommendations:  Repeat colonoscopy recommended for dec 2022  Past Medical History:  Diagnosis Date   Anxiety    Arthritis    Bladder stone    Depression    Diverticulosis of colon    GERD (gastroesophageal reflux disease)    H/O hiatal hernia    Helicobacter pylori gastritis    History of colonoscopy with polypectomy    TUBULAR ADENOMA-- 2012   History of esophageal dilatation    STRICTURE--  LAST DONE 03/2013   Hypertension    for 5 yrs   IDA (iron deficiency anemia) 11/22/2018   Left ureteral calculus    Osteoporosis    Wears glasses     Past Surgical History:  Procedure Laterality Date   BIOPSY  09/10/2016   Procedure: BIOPSY;  Surgeon: Rogene Houston, MD;  [facility];  Service: Endoscopy;;  rectal polyp   COLONOSCOPY WITH PROPOFOL N/A 09/10/2016   Procedure: COLONOSCOPY WITH PROPOFOL;  Surgeon: Rogene Houston, MD;  [facility];  Service: Endoscopy;  Laterality: N/A;   CYSTO/  URETEROSCOPIC LITHOTRIPSY/ STONE EXTRACTION  1995   CYSTOSCOPY W/ RETROGRADES Right 01/15/2014   Procedure: CYSTOSCOPY WITH RIGHT RETROGRADE PYELOGRAM;  Surgeon: Irine Seal, MD;  Location: [facility];  Service: Urology;  Laterality: Right;   CYSTOSCOPY WITH RETROGRADE PYELOGRAM, URETEROSCOPY AND STENT PLACEMENT Left 01/15/2014   Procedure: CYSTOSCOPY , LEFT RETROGRADE PYELOGRAM,  LEFT URETEROSCOPY, BASKET STONE EXTRACTION;  Surgeon: Irine Seal, MD;  Location: [facility];  Service: Urology;  Laterality: Left;   ESOPHAGEAL DILATION  03/21/2013   Procedure: ESOPHAGEAL DILATION;  Surgeon: Danie Binder, MD;  [facility];  Service: Endoscopy;;   ESOPHAGEAL DILATION N/A 09/10/2016   Procedure: ESOPHAGEAL DILATION;  Surgeon: Rogene Houston, MD;  [facility];  Service: Endoscopy;  Laterality: N/A;    ESOPHAGOGASTRODUODENOSCOPY N/A 03/21/2013   Procedure: ESOPHAGOGASTRODUODENOSCOPY (EGD);  Surgeon: Danie Binder, MD;  [facility];  Service: Endoscopy;  Laterality: N/A;   ESOPHAGOGASTRODUODENOSCOPY (EGD) WITH ESOPHAGEAL DILATION N/A 04/26/2013   Procedure: ESOPHAGOGASTRODUODENOSCOPY (EGD) WITH ESOPHAGEAL DILATION;  Surgeon: Rogene Houston, MD;  [facility];  Service: Endoscopy;  Laterality: N/A;  130   ESOPHAGOGASTRODUODENOSCOPY (EGD) WITH PROPOFOL N/A 09/10/2016   Procedure: ESOPHAGOGASTRODUODENOSCOPY (EGD) WITH PROPOFOL;  Surgeon: Rogene Houston, MD;  [facility];  Service: Endoscopy;  Laterality: N/A;  10:20   EXTRACORPOREAL SHOCK WAVE LITHOTRIPSY  X2   YRS AGO   HOLMIUM LASER APPLICATION Left 2/42/6834   Procedure: HOLMIUM LASER APPLICATION, LEFT URETER;  Surgeon: Irine Seal, MD;  Location: [facility];  Service: Urology;  Laterality: Left;   SHOULDER ARTHROSCOPY W/ SUBACROMIAL DECOMPRESSION AND DISTAL CLAVICLE EXCISION Left 2000    Current Outpatient Medications  Medication Sig Dispense Refill   cimetidine (TAGAMET) 400 MG tablet Take 1 tablet (400 mg total) by mouth daily as needed. 30 tablet 5   ferrous sulfate 325 (65 FE) MG EC tablet Take 1 tablet by mouth daily.     fish oil-omega-3 fatty acids 1000 MG capsule Take 2 g by mouth daily after breakfast.       gabapentin (NEURONTIN) 600 MG tablet TAKE 1 TABLET BY MOUTH THREE TIMES DAILY (Patient taking differently: Take 600 mg by mouth. Takes 1-2 daily) 90 tablet 0   lisinopril (ZESTRIL) 40 MG tablet Take 40 mg by mouth daily.     LORazepam (ATIVAN) 1 MG tablet Take 1 tablet (1 mg total) by mouth every 6 (six) hours as needed for anxiety. (Patient taking differently: Take 1 mg by mouth every 8 (eight) hours as needed for anxiety.) 120 tablet 1   Multiple Vitamin (MULTIVITAMIN) tablet Take 1 tablet by mouth daily.     pantoprazole (PROTONIX) 40 MG tablet TAKE 1 TABLET BY MOUTH EVERY  MORNING 30 tablet 11   rOPINIRole (REQUIP) 0.25 MG tablet TAKE THREE TO FOUR TABLETS BY MOUTH AT BEDTIME (Patient taking differently: Take 0.25 mg by mouth See admin instructions. Take 6 to 8  tablets by mouth at bedtime for restless legs) 360 tablet 0   No current facility-administered medications for this visit.    Allergies as of 10/15/2021 - Review Complete 10/15/2021  Allergen Reaction Noted   Benadryl [diphenhydramine] Other (See Comments) 01/14/2014   Hctz [hydrochlorothiazide]  08/03/2018   Mepergan [meperidine-promethazine]  07/20/2011   Other  07/06/2016   Oxycodone Itching 01/18/2014   Amoxicillin  07/06/2016   Flagyl [metronidazole] Anxiety 03/04/2017   Singulair [montelukast sodium] Anxiety 03/04/2017    Family History  Problem Relation Age of Onset   Anxiety disorder Father    Anxiety disorder Son    Anxiety disorder Daughter     Social History   Socioeconomic History   Marital status: Married    Spouse name: Horris Latino   Number of children: 3   Years of education: 13   Highest education level:  Some college, no degree  Occupational History   Occupation: retired  Tobacco Use   Smoking status: Former    Packs/day: 0.25    Years: 2.00    Pack years: 0.50    Types: Cigarettes    Quit date: 01/14/1969    Years since quitting: 52.7   Smokeless tobacco: Former    Types: Chew    Quit date: 01/15/1999  Vaping Use   Vaping Use: Never used  Substance and Sexual Activity   Alcohol use: Not Currently   Drug use: No   Sexual activity: Not Currently    Birth control/protection: None  Other Topics Concern   Not on file  Social History Narrative   Right handed   Lives with family    Social Determinants of Health   Financial Resource Strain: Not on file  Food Insecurity: Not on file  Transportation Needs: Not on file  Physical Activity: Not on file  Stress: Not on file  Social Connections: Not on file   Review of systems General: negative for malaise, night  sweats, fever, chills, weight loss Neck: Negative for lumps, goiter, pain and significant neck swelling Resp: Negative for cough, wheezing, dyspnea at rest CV: Negative for chest pain, leg swelling, palpitations, orthopnea GI: denies melena, hematochezia, nausea, vomiting, diarrhea, constipation, dysphagia, odyonophagia, early satiety or unintentional weight loss. +LLQ pain MSK: Negative for joint pain or swelling, back pain, and muscle pain. Derm: Negative for itching or rash Psych: Denies depression, anxiety, memory loss, confusion. No homicidal or suicidal ideation.  Heme: Negative for prolonged bleeding, bruising easily, and swollen nodes. Endocrine: Negative for cold or heat intolerance, polyuria, polydipsia and goiter. Neuro: negative for tremor, gait imbalance, syncope and seizures. The remainder of the review of systems is noncontributory.  Physical Exam: BP (!) 169/89 (BP Location: Right Arm, Patient Position: Sitting, Cuff Size: Large)    Pulse 71    Temp 98.7 F (37.1 C) (Oral)    Ht 5\' 5"  (1.651 m)    Wt 162 lb 6.4 oz (73.7 kg)    BMI 27.02 kg/m  General:   Alert and oriented. No distress noted. Pleasant and cooperative.  Head:  Normocephalic and atraumatic. Eyes:  Conjuctiva clear without scleral icterus. Mouth:  Oral mucosa pink and moist. Good dentition. No lesions. Heart: Normal rate and rhythm, s1 and s2 heart sounds present.  Lungs: Clear lung sounds in all lobes. Respirations equal and unlabored. Abdomen:  +BS, soft,  and non-distended. Tenderness of LLQ with deep palpation. No rebound or guarding. No HSM or masses noted. Derm: No palmar erythema or jaundice Msk:  Symmetrical without gross deformities. Normal posture. Extremities:  Without edema. Neurologic:  Alert and  oriented x4 Psych:  Alert and cooperative. Normal mood and affect.  Invalid input(s): 6 MONTHS   ASSESSMENT: Cameron Atkins is a 76 y.o. male presenting today for LLQ pain.  Mild, persistent LLQ  pan x1.5 weeks, initially sharp in nature, began after prostate exam. Patient has known sigmoid diverticulosis with hx of acute diverticulitis in 2017. He has no rectal bleeding, vomiting, fevers, or diarrhea. Abdominal exam with TTP of deep palpation of LLQ. Given his history, we will proceed with CT A/P w/contrast to rule out acute diverticulitis. I advised him to let me know if he develops and new or worsening GI symptoms as well as symptoms that would warrant him proceeding to the ER such as severe abdominal pain, vomiting that does not subside, rectal bleeding or fever. Patient verbalized understanding.  PLAN:  CT A/P with contrast 2. Patient to let me know if he develops new or worsening symptoms 3. ER precautions ( worsening abdominal pain, vomiting, rectal bleeding, fevers)   Follow Up: PRN  Lenya Sterne L. Alver Sorrow, MSN, APRN, AGNP-C Adult-Gerontology Nurse Practitioner [facility]

## 2021-10-15 NOTE — Patient Instructions (Signed)
We will get you set up for a CT for further evaluation of your left lower quadrant pain. If you develop worsening abdominal pain, nausea/vomiting, diarrhea, fevers, blood in stools or black stools, please let me know. If you develop any of these symptoms in the evening or over the weekend, you would need to proceed to the ED.  I will be in touch with the results of the CT once I have those, if CT is negative, we will consider proceeding with Colonoscopy.

## 2021-10-16 ENCOUNTER — Other Ambulatory Visit (INDEPENDENT_AMBULATORY_CARE_PROVIDER_SITE_OTHER): Payer: Self-pay

## 2021-10-22 ENCOUNTER — Other Ambulatory Visit: Payer: Self-pay

## 2021-10-22 ENCOUNTER — Ambulatory Visit (HOSPITAL_BASED_OUTPATIENT_CLINIC_OR_DEPARTMENT_OTHER)
Admission: RE | Admit: 2021-10-22 | Discharge: 2021-10-22 | Disposition: A | Discharge status: Home / Self Care | Payer: Medicare Other | Source: Ambulatory Visit | Attending: Gastroenterology | Admitting: Gastroenterology

## 2021-10-22 ENCOUNTER — Encounter (HOSPITAL_BASED_OUTPATIENT_CLINIC_OR_DEPARTMENT_OTHER): Payer: Self-pay

## 2021-10-22 DIAGNOSIS — N200 Calculus of kidney: Secondary | ICD-10-CM | POA: Diagnosis not present

## 2021-10-22 DIAGNOSIS — K8020 Calculus of gallbladder without cholecystitis without obstruction: Secondary | ICD-10-CM | POA: Diagnosis not present

## 2021-10-22 DIAGNOSIS — R109 Unspecified abdominal pain: Secondary | ICD-10-CM | POA: Diagnosis not present

## 2021-10-22 DIAGNOSIS — R1032 Left lower quadrant pain: Secondary | ICD-10-CM | POA: Diagnosis not present

## 2021-10-22 DIAGNOSIS — I700 Atherosclerosis of aorta: Secondary | ICD-10-CM | POA: Diagnosis not present

## 2021-10-22 LAB — POCT I-STAT CREATININE: Creatinine, Ser: 1.2 mg/dL (ref 0.61–1.24)

## 2021-10-22 MED ORDER — IOHEXOL 300 MG/ML  SOLN
75.0000 mL | Freq: Once | INTRAMUSCULAR | Status: AC | PRN
  Administered 2021-10-22: 75 mL via INTRAVENOUS

## 2021-11-02 ENCOUNTER — Telehealth (INDEPENDENT_AMBULATORY_CARE_PROVIDER_SITE_OTHER): Payer: Self-pay | Admitting: *Deleted

## 2021-11-02 NOTE — Telephone Encounter (Signed)
Patient rec'd your <yChart message about his CT - he has some questions - please call  Ph# [phone]

## 2021-11-09 ENCOUNTER — Encounter (INDEPENDENT_AMBULATORY_CARE_PROVIDER_SITE_OTHER): Payer: Self-pay

## 2021-11-09 ENCOUNTER — Other Ambulatory Visit (INDEPENDENT_AMBULATORY_CARE_PROVIDER_SITE_OTHER): Payer: Self-pay

## 2021-11-09 ENCOUNTER — Telehealth (INDEPENDENT_AMBULATORY_CARE_PROVIDER_SITE_OTHER): Payer: Self-pay

## 2021-11-09 DIAGNOSIS — R1032 Left lower quadrant pain: Secondary | ICD-10-CM

## 2021-11-09 MED ORDER — PEG 3350-KCL-NA BICARB-NACL 420 G PO SOLR: 4000.0000 mL | ORAL | 0 refills | Status: DC

## 2021-11-09 NOTE — Telephone Encounter (Signed)
Cameron Atkins Cameron Atkins, CMA  ?

## 2021-11-10 ENCOUNTER — Encounter (INDEPENDENT_AMBULATORY_CARE_PROVIDER_SITE_OTHER): Payer: Self-pay

## 2021-11-20 NOTE — Patient Instructions (Signed)
CORKY BLUMSTEIN  11/20/2021     @PREFPERIOPPHARMACY @   Your procedure is scheduled on  11/25/2021.   Report to [facility] at  1130  A.M.   Call this number if you have problems the morning of surgery:  [phone]   Remember:   Follow the diet and prep instructions given to you by the office.    Take these medicines the morning of surgery with A SIP OF WATER                   tagamet, gabapentin, ativan, protonix.     Do not wear jewelry, make-up or nail polish.  Do not wear lotions, powders, or perfumes, or deodorant.  Do not shave 48 hours prior to surgery.  Men may shave face and neck.  Do not bring valuables to the hospital.  [facility] is not responsible for any belongings or valuables.  Contacts, dentures or bridgework may not be worn into surgery.  Leave your suitcase in the car.  After surgery it may be brought to your room.  For patients admitted to the hospital, discharge time will be determined by your treatment team.  Patients discharged the day of surgery will not be allowed to drive home and must  have someone with them for 24 hours.    Special instructions:   DO NOT smoke tobacco or vape for 24 hours before your procedure.  Please read over the following fact sheets that you were given. Anesthesia Post-op Instructions and Care and Recovery After Surgery      Colonoscopy, Adult, Care After This sheet gives you information about how to care for yourself after your procedure. Your health care provider may also give you more specific instructions. If you have problems or questions, contact your health care provider. What can I expect after the procedure? After the procedure, it is common to have: A small amount of blood in your stool for 24 hours after the procedure. Some gas. Mild cramping or bloating of your abdomen. Follow these instructions at home: Eating and drinking  Drink enough fluid to keep your urine pale yellow. Follow instructions  from your health care provider about eating or drinking restrictions. Resume your normal diet as instructed by your health care provider. Avoid heavy or fried foods that are hard to digest. Activity Rest as told by your health care provider. Avoid sitting for a long time without moving. Get up to take short walks every 1-2 hours. This is important to improve blood flow and breathing. Ask for help if you feel weak or unsteady. Return to your normal activities as told by your health care provider. Ask your health care provider what activities are safe for you. Managing cramping and bloating  Try walking around when you have cramps or feel bloated. Apply heat to your abdomen as told by your health care provider. Use the heat source that your health care provider recommends, such as a moist heat pack or a heating pad. Place a towel between your skin and the heat source. Leave the heat on for 20-30 minutes. Remove the heat if your skin turns bright red. This is especially important if you are unable to feel pain, heat, or cold. You may have a greater risk of getting burned. General instructions If you were given a sedative during the procedure, it can affect you for several hours. Do not drive or operate machinery until your health care provider says that it  is safe. For the first 24 hours after the procedure: Do not sign important documents. Do not drink alcohol. Do your regular daily activities at a slower pace than normal. Eat soft foods that are easy to digest. Take over-the-counter and prescription medicines only as told by your health care provider. Keep all follow-up visits as told by your health care provider. This is important. Contact a health care provider if: You have blood in your stool 2-3 days after the procedure. Get help right away if you have: More than a small spotting of blood in your stool. Large blood clots in your stool. Swelling of your abdomen. Nausea or vomiting. A  fever. Increasing pain in your abdomen that is not relieved with medicine. Summary After the procedure, it is common to have a small amount of blood in your stool. You may also have mild cramping and bloating of your abdomen. If you were given a sedative during the procedure, it can affect you for several hours. Do not drive or operate machinery until your health care provider says that it is safe. Get help right away if you have a lot of blood in your stool, nausea or vomiting, a fever, or increased pain in your abdomen. This information is not intended to replace advice given to you by your health care provider. Make sure you discuss any questions you have with your health care provider. Document Revised: 07/20/2019 Document Reviewed: 04/09/2019 Elsevier Patient Education  [place] After This sheet gives you information about how to care for yourself after your procedure. Your health care provider may also give you more specific instructions. If you have problems or questions, contact your health care provider. What can I expect after the procedure? After the procedure, it is common to have: Tiredness. Forgetfulness about what happened after the procedure. Impaired judgment for important decisions. Nausea or vomiting. Some difficulty with balance. Follow these instructions at home: For the time period you were told by your health care provider:   Rest as needed. Do not participate in activities where you could fall or become injured. Do not drive or use machinery. Do not drink alcohol. Do not take sleeping pills or medicines that cause drowsiness. Do not make important decisions or sign legal documents. Do not take care of children on your own. Eating and drinking Follow the diet that is recommended by your health care provider. Drink enough fluid to keep your urine pale yellow. If you vomit: Drink water, juice, or soup when you can drink  without vomiting. Make sure you have little or no nausea before eating solid foods. General instructions Have a responsible adult stay with you for the time you are told. It is important to have someone help care for you until you are awake and alert. Take over-the-counter and prescription medicines only as told by your health care provider. If you have sleep apnea, surgery and certain medicines can increase your risk for breathing problems. Follow instructions from your health care provider about wearing your sleep device: Anytime you are sleeping, including during daytime naps. While taking prescription pain medicines, sleeping medicines, or medicines that make you drowsy. Avoid smoking. Keep all follow-up visits as told by your health care provider. This is important. Contact a health care provider if: You keep feeling nauseous or you keep vomiting. You feel light-headed. You are still sleepy or having trouble with balance after 24 hours. You develop a rash. You have a fever. You have redness  or swelling around the IV site. Get help right away if: You have trouble breathing. You have new-onset confusion at home. Summary For several hours after your procedure, you may feel tired. You may also be forgetful and have poor judgment. Have a responsible adult stay with you for the time you are told. It is important to have someone help care for you until you are awake and alert. Rest as told. Do not drive or operate machinery. Do not drink alcohol or take sleeping pills. Get help right away if you have trouble breathing, or if you suddenly become confused. This information is not intended to replace advice given to you by your health care provider. Make sure you discuss any questions you have with your health care provider. Document Revised: 05/29/2020 Document Reviewed: 08/16/2019 Elsevier Patient Education  2022 [employer].

## 2021-11-23 ENCOUNTER — Encounter (HOSPITAL_COMMUNITY)
Admission: RE | Admit: 2021-11-23 | Discharge: 2021-11-23 | Disposition: A | Discharge status: Home / Self Care | Payer: Medicare Other | Source: Ambulatory Visit | Attending: Internal Medicine | Admitting: Internal Medicine

## 2021-11-23 DIAGNOSIS — R3915 Urgency of urination: Secondary | ICD-10-CM | POA: Diagnosis not present

## 2021-11-23 DIAGNOSIS — R972 Elevated prostate specific antigen [PSA]: Secondary | ICD-10-CM | POA: Diagnosis not present

## 2021-11-23 NOTE — Pre-Procedure Instructions (Signed)
Message sent to Lucendia Herrlich at Dr Olevia Perches office to let her know I have been unable to contact patient.

## 2021-11-24 ENCOUNTER — Other Ambulatory Visit: Payer: Self-pay

## 2021-11-24 ENCOUNTER — Encounter (HOSPITAL_COMMUNITY): Payer: Self-pay

## 2021-11-25 ENCOUNTER — Ambulatory Visit (HOSPITAL_BASED_OUTPATIENT_CLINIC_OR_DEPARTMENT_OTHER): Payer: Medicare Other | Admitting: Anesthesiology

## 2021-11-25 ENCOUNTER — Ambulatory Visit (HOSPITAL_COMMUNITY): Payer: Medicare Other | Admitting: Anesthesiology

## 2021-11-25 ENCOUNTER — Encounter (INDEPENDENT_AMBULATORY_CARE_PROVIDER_SITE_OTHER): Payer: Self-pay | Admitting: *Deleted

## 2021-11-25 ENCOUNTER — Encounter (HOSPITAL_COMMUNITY): Payer: Self-pay | Admitting: Internal Medicine

## 2021-11-25 ENCOUNTER — Ambulatory Visit (HOSPITAL_COMMUNITY)
Admission: RE | Admit: 2021-11-25 | Discharge: 2021-11-25 | Disposition: A | Discharge status: Home / Self Care | Payer: Medicare Other | Attending: Internal Medicine | Admitting: Internal Medicine

## 2021-11-25 ENCOUNTER — Encounter (HOSPITAL_COMMUNITY)
Admission: RE | Disposition: A | Discharge status: Home / Self Care | Payer: Self-pay | Source: Home / Self Care | Attending: Internal Medicine

## 2021-11-25 DIAGNOSIS — K6289 Other specified diseases of anus and rectum: Secondary | ICD-10-CM | POA: Diagnosis not present

## 2021-11-25 DIAGNOSIS — K449 Diaphragmatic hernia without obstruction or gangrene: Secondary | ICD-10-CM | POA: Diagnosis not present

## 2021-11-25 DIAGNOSIS — F419 Anxiety disorder, unspecified: Secondary | ICD-10-CM | POA: Insufficient documentation

## 2021-11-25 DIAGNOSIS — K635 Polyp of colon: Secondary | ICD-10-CM | POA: Diagnosis not present

## 2021-11-25 DIAGNOSIS — K5730 Diverticulosis of large intestine without perforation or abscess without bleeding: Secondary | ICD-10-CM | POA: Diagnosis not present

## 2021-11-25 DIAGNOSIS — Z79899 Other long term (current) drug therapy: Secondary | ICD-10-CM | POA: Diagnosis not present

## 2021-11-25 DIAGNOSIS — K6389 Other specified diseases of intestine: Secondary | ICD-10-CM | POA: Insufficient documentation

## 2021-11-25 DIAGNOSIS — D125 Benign neoplasm of sigmoid colon: Secondary | ICD-10-CM | POA: Diagnosis not present

## 2021-11-25 DIAGNOSIS — Z1211 Encounter for screening for malignant neoplasm of colon: Secondary | ICD-10-CM | POA: Insufficient documentation

## 2021-11-25 DIAGNOSIS — Z09 Encounter for follow-up examination after completed treatment for conditions other than malignant neoplasm: Secondary | ICD-10-CM | POA: Diagnosis not present

## 2021-11-25 DIAGNOSIS — Z87891 Personal history of nicotine dependence: Secondary | ICD-10-CM | POA: Insufficient documentation

## 2021-11-25 DIAGNOSIS — K219 Gastro-esophageal reflux disease without esophagitis: Secondary | ICD-10-CM | POA: Insufficient documentation

## 2021-11-25 DIAGNOSIS — F418 Other specified anxiety disorders: Secondary | ICD-10-CM | POA: Diagnosis not present

## 2021-11-25 DIAGNOSIS — Z86010 Personal history of colonic polyps: Secondary | ICD-10-CM | POA: Insufficient documentation

## 2021-11-25 DIAGNOSIS — I10 Essential (primary) hypertension: Secondary | ICD-10-CM | POA: Insufficient documentation

## 2021-11-25 DIAGNOSIS — R1032 Left lower quadrant pain: Secondary | ICD-10-CM

## 2021-11-25 DIAGNOSIS — K648 Other hemorrhoids: Secondary | ICD-10-CM | POA: Diagnosis not present

## 2021-11-25 DIAGNOSIS — D649 Anemia, unspecified: Secondary | ICD-10-CM | POA: Insufficient documentation

## 2021-11-25 DIAGNOSIS — M1990 Unspecified osteoarthritis, unspecified site: Secondary | ICD-10-CM | POA: Insufficient documentation

## 2021-11-25 DIAGNOSIS — K644 Residual hemorrhoidal skin tags: Secondary | ICD-10-CM | POA: Insufficient documentation

## 2021-11-25 HISTORY — PX: POLYPECTOMY: SHX149

## 2021-11-25 HISTORY — PX: COLONOSCOPY WITH PROPOFOL: SHX5780

## 2021-11-25 LAB — HM COLONOSCOPY

## 2021-11-25 SURGERY — COLONOSCOPY WITH PROPOFOL
Anesthesia: General

## 2021-11-25 MED ORDER — PROPOFOL 10 MG/ML IV BOLUS
INTRAVENOUS | Status: DC | PRN
  Administered 2021-11-25: 80 mg via INTRAVENOUS

## 2021-11-25 MED ORDER — LIDOCAINE HCL (CARDIAC) PF 50 MG/5ML IV SOSY
PREFILLED_SYRINGE | INTRAVENOUS | Status: DC | PRN
  Administered 2021-11-25: 50 mg via INTRAVENOUS

## 2021-11-25 MED ORDER — PROPOFOL 500 MG/50ML IV EMUL
INTRAVENOUS | Status: DC | PRN
  Administered 2021-11-25: 125 ug/kg/min via INTRAVENOUS

## 2021-11-25 MED ORDER — LACTATED RINGERS IV SOLN
INTRAVENOUS | Status: DC
  Administered 2021-11-25: 1000 mL via INTRAVENOUS

## 2021-11-25 NOTE — Discharge Instructions (Signed)
No aspirin or NSAIDs for 24 hours. ?Resume usual medications and high-fiber diet as before. ?No driving for 24 hours. ?Physician will call with biopsy results. ?

## 2021-11-25 NOTE — Op Note (Signed)
[facility] ?Patient Name: Cameron Atkins ?Procedure Date: 11/25/2021 12:13 PM ?MRN: [id number] ?Date of Birth: 1946-07-18 ?Attending MD: Hildred Laser , MD ?CSN: [id number] ?Age: 76 ?Admit Type: Outpatient ?Procedure:                Colonoscopy ?Indications:              High risk colon cancer surveillance: Personal  ?                          history of colonic polyps ?Providers:                Hildred Laser, MD, Kanarraville Page, Wynonia Musty  ?                          Tech, Technician ?Referring MD:             Delphina Cahill, MD ?Medicines:                Propofol per Anesthesia ?Complications:            No immediate complications. ?Estimated Blood Loss:     Estimated blood loss was minimal. ?Procedure:                Pre-Anesthesia Assessment: ?                          - Prior to the procedure, a History and Physical  ?                          was performed, and patient medications and  ?                          allergies were reviewed. The patient's tolerance of  ?                          previous anesthesia was also reviewed. The risks  ?                          and benefits of the procedure and the sedation  ?                          options and risks were discussed with the patient.  ?                          All questions were answered, and informed consent  ?                          was obtained. Prior Anticoagulants: The patient has  ?                          taken no previous anticoagulant or antiplatelet  ?                          agents. ASA Grade Assessment: II - A patient with  ?  mild systemic disease. After reviewing the risks  ?                          and benefits, the patient was deemed in  ?                          satisfactory condition to undergo the procedure. ?                          After obtaining informed consent, the colonoscope  ?                          was passed under direct vision. Throughout the  ?                          procedure, the  patient's blood pressure, pulse, and  ?                          oxygen saturations were monitored continuously. The  ?                          PCF-HQ190L ([id number]) scope was introduced through  ?                          the anus and advanced to the the cecum, identified  ?                          by appendiceal orifice and ileocecal valve. The  ?                          colonoscopy was performed without difficulty. The  ?                          patient tolerated the procedure well. The quality  ?                          of the bowel preparation was good. The ileocecal  ?                          valve, appendiceal orifice, and rectum were  ?                          photographed. ?Scope In: 12:38:57 PM ?Scope Out: 12:57:47 PM ?Scope Withdrawal Time: 0 hours 9 minutes 42 seconds  ?Total Procedure Duration: 0 hours 18 minutes 50 seconds  ?Findings: ?     The perianal and digital rectal examinations were normal. ?     A diffuse area of moderate melanosis was found in the entire colon. ?     Multiple diverticula were found in the sigmoid colon. ?     A 4 mm polyp was found in the distal sigmoid colon. The polyp was  ?     sessile. The polyp was removed with a cold snare. Resection and  ?     retrieval were complete. ?     External hemorrhoids were found  during retroflexion. The hemorrhoids  ?     were small. ?     Anal papilla(e) were hypertrophied. ?Impression:               - Melanosis in the colon. ?                          - Diverticulosis in the sigmoid colon. ?                          - One 4 mm polyp in the distal sigmoid colon,  ?                          removed with a cold snare. Resected and retrieved. ?                          - External hemorrhoids. ?                          - Anal papilla(e) were hypertrophied. ?Moderate Sedation: ?     Per Anesthesia Care ?Recommendation:           - Patient has a contact number available for  ?                          emergencies. The signs and symptoms of  potential  ?                          delayed complications were discussed with the  ?                          patient. Return to normal activities tomorrow.  ?                          Written discharge instructions were provided to the  ?                          patient. ?                          - High fiber diet today. ?                          - Continue present medications. ?                          - No aspirin, ibuprofen, naproxen, or other  ?                          non-steroidal anti-inflammatory drugs for 1 day. ?                          - Await pathology results. ?                          - Repeat colonoscopy is recommended. The  ?  colonoscopy date will be determined after pathology  ?                          results from today's exam become available for  ?                          review. ?Procedure Code(s):        --- Professional --- ?                          [phone], Colonoscopy, flexible; with removal of  ?                          tumor(s), polyp(s), or other lesion(s) by snare  ?                          technique ?Diagnosis Code(s):        --- Professional --- ?                          Z86.010, Personal history of colonic polyps ?                          K63.89, Other specified diseases of intestine ?                          K64.4, Residual hemorrhoidal skin tags ?                          K63.5, Polyp of colon ?                          K62.89, Other specified diseases of anus and rectum ?                          K57.30, Diverticulosis of large intestine without  ?                          perforation or abscess without bleeding ?CPT copyright 2019 American Medical Association. All rights reserved. ?The codes documented in this report are preliminary and upon coder review may  ?be revised to meet current compliance requirements. ?Hildred Laser, MD ?Hildred Laser, MD ?11/25/2021 1:05:22 PM ?This report has been signed electronically. ?Number of Addenda: 0 ?

## 2021-11-25 NOTE — Anesthesia Preprocedure Evaluation (Signed)
Anesthesia Evaluation  ?Patient identified by MRN, date of birth, ID band ?Patient awake ? ? ? ?Reviewed: ?Allergy & Precautions, NPO status , Patient's Chart, lab work & pertinent test results ? ?Airway ?Mallampati: II ? ?TM Distance: >3 FB ?Neck ROM: Full ? ? ? Dental ? ?(+) Dental Advisory Given, Caps ?  ?Pulmonary ?former smoker,  ?  ?Pulmonary exam normal ?breath sounds clear to auscultation ? ? ? ? ? ? Cardiovascular ?Exercise Tolerance: Good ?hypertension, Pt. on medications ?Normal cardiovascular exam ?Rhythm:Regular Rate:Normal ? ? ?  ?Neuro/Psych ?PSYCHIATRIC DISORDERS Anxiety Depression negative neurological ROS ?   ? GI/Hepatic ?Neg liver ROS, hiatal hernia, GERD  Medicated and Controlled,  ?Endo/Other  ?negative endocrine ROS ? Renal/GU ?negative Renal ROS  ?negative genitourinary ?  ?Musculoskeletal ? ?(+) Arthritis ,  ? Abdominal ?  ?Peds ?negative pediatric ROS ?(+)  Hematology ? ?(+) Blood dyscrasia, anemia ,   ?Anesthesia Other Findings ? ? Reproductive/Obstetrics ?negative OB ROS ? ?  ? ? ? ? ? ? ? ? ? ? ? ? ? ?  ?  ? ? ? ? ? ? ? ?Anesthesia Physical ?Anesthesia Plan ? ?ASA: 2 ? ?Anesthesia Plan: General  ? ?Post-op Pain Management: Minimal or no pain anticipated  ? ?Induction: Intravenous ? ?PONV Risk Score and Plan: TIVA ? ?Airway Management Planned: Nasal Cannula and Natural Airway ? ?Additional Equipment:  ? ?Intra-op Plan:  ? ?Post-operative Plan:  ? ?Informed Consent: I have reviewed the patients History and Physical, chart, labs and discussed the procedure including the risks, benefits and alternatives for the proposed anesthesia with the patient or authorized representative who has indicated his/her understanding and acceptance.  ? ? ? ?Dental advisory given ? ?Plan Discussed with: CRNA and Surgeon ? ?Anesthesia Plan Comments:   ? ? ? ? ? ? ?Anesthesia Quick Evaluation ? ?

## 2021-11-25 NOTE — Anesthesia Postprocedure Evaluation (Addendum)
Anesthesia Post Note ? ?Patient: Cameron Atkins ? ?Procedure(s) Performed: COLONOSCOPY WITH PROPOFOL ?POLYPECTOMY INTESTINAL ? ?Patient location during evaluation: Phase II ?Anesthesia Type: General ?Level of consciousness: awake and alert and oriented ?Pain management: pain level controlled ?Vital Signs Assessment: post-procedure vital signs reviewed and stable ?Respiratory status: spontaneous breathing, nonlabored ventilation and respiratory function stable ?Cardiovascular status: blood pressure returned to baseline and stable ?Postop Assessment: no apparent nausea or vomiting ?Anesthetic complications: no ? ? ?No notable events documented. ? ? ?Last Vitals:  ?Vitals:  ? 11/25/21 1152 11/25/21 1304  ?BP: (!) 166/87 (!) 110/58  ?Pulse: 79 69  ?Resp: 16 16  ?Temp: 36.9 ?C 36.6 ?C  ?SpO2: 97% 98%  ?  ?Last Pain:  ?Vitals:  ? 11/25/21 1304  ?TempSrc: Oral  ?PainSc: 0-No pain  ? ? ?  ?  ?  ?  ?  ?  ? ?Domingue Coltrain C Adoni Greenough ? ? ? ? ?

## 2021-11-25 NOTE — H&P (Signed)
Cameron Atkins is an 76 y.o. male.   ?Chief Complaint: Patient is here for colonoscopy. ?HPI: Patient is 76 year old Caucasian male who has a history of colonic adenomas and is here for surveillance colonoscopy.  He had adenoma removed in 2009 and again in 2012.  Last exam in December 2017 revealed hyperplastic polyp. ?He denies rectal bleeding.  He was evaluated in our office last month for LLQ abdominal pain.  CT suggested constipation.  He says pain resolved after about 2 weeks without any intervention. ?Family history is negative for colon cancer. ?Patient does not take aspirin or anti coagulant. ? ? ?Past Medical History:  ?Diagnosis Date  ? Anxiety   ? Arthritis   ? Bladder stone   ? Depression   ? Diverticulosis of colon   ? GERD (gastroesophageal reflux disease)   ? H/O hiatal hernia   ? Helicobacter pylori gastritis   ? History of colonoscopy with polypectomy   ? TUBULAR ADENOMA-- 2012  ? History of esophageal dilatation   ? STRICTURE--  LAST DONE 03/2013  ? Hypertension   ? for 5 yrs  ? IDA (iron deficiency anemia) 11/22/2018  ? Left ureteral calculus   ? Osteoporosis   ? Wears glasses   ? ? ?Past Surgical History:  ?Procedure Laterality Date  ? BIOPSY  09/10/2016  ? Procedure: BIOPSY;  Surgeon: Rogene Houston, MD;  [facility];  Service: Endoscopy;;  rectal polyp  ? COLONOSCOPY WITH PROPOFOL N/A 09/10/2016  ? Procedure: COLONOSCOPY WITH PROPOFOL;  Surgeon: Rogene Houston, MD;  [facility];  Service: Endoscopy;  Laterality: N/A;  ? CYSTO/  URETEROSCOPIC LITHOTRIPSY/ STONE EXTRACTION  1995  ? CYSTOSCOPY W/ RETROGRADES Right 01/15/2014  ? Procedure: CYSTOSCOPY WITH RIGHT RETROGRADE PYELOGRAM;  Surgeon: Irine Seal, MD;  Location: [facility];  Service: Urology;  Laterality: Right;  ? CYSTOSCOPY WITH RETROGRADE PYELOGRAM, URETEROSCOPY AND STENT PLACEMENT Left 01/15/2014  ? Procedure: CYSTOSCOPY , LEFT RETROGRADE PYELOGRAM, LEFT URETEROSCOPY, BASKET STONE EXTRACTION;   Surgeon: Irine Seal, MD;  Location: [facility];  Service: Urology;  Laterality: Left;  ? ESOPHAGEAL DILATION  03/21/2013  ? Procedure: ESOPHAGEAL DILATION;  Surgeon: Danie Binder, MD;  [facility];  Service: Endoscopy;;  ? ESOPHAGEAL DILATION N/A 09/10/2016  ? Procedure: ESOPHAGEAL DILATION;  Surgeon: Rogene Houston, MD;  [facility];  Service: Endoscopy;  Laterality: N/A;  ? ESOPHAGOGASTRODUODENOSCOPY N/A 03/21/2013  ? Procedure: ESOPHAGOGASTRODUODENOSCOPY (EGD);  Surgeon: Danie Binder, MD;  [facility];  Service: Endoscopy;  Laterality: N/A;  ? ESOPHAGOGASTRODUODENOSCOPY (EGD) WITH ESOPHAGEAL DILATION N/A 04/26/2013  ? Procedure: ESOPHAGOGASTRODUODENOSCOPY (EGD) WITH ESOPHAGEAL DILATION;  Surgeon: Rogene Houston, MD;  [facility];  Service: Endoscopy;  Laterality: N/A;  130  ? ESOPHAGOGASTRODUODENOSCOPY (EGD) WITH PROPOFOL N/A 09/10/2016  ? Procedure: ESOPHAGOGASTRODUODENOSCOPY (EGD) WITH PROPOFOL;  Surgeon: Rogene Houston, MD;  [facility];  Service: Endoscopy;  Laterality: N/A;  10:20  ? EXTRACORPOREAL SHOCK WAVE LITHOTRIPSY  X2   YRS AGO  ? HOLMIUM LASER APPLICATION Left 7/78/2423  ? Procedure: HOLMIUM LASER APPLICATION, LEFT URETER;  Surgeon: Irine Seal, MD;  Location: [facility];  Service: Urology;  Laterality: Left;  ? SHOULDER ARTHROSCOPY W/ SUBACROMIAL DECOMPRESSION AND DISTAL CLAVICLE EXCISION Left 2000  ? ? ?Family History  ?Problem Relation Age of Onset  ? Anxiety disorder Father   ? Anxiety disorder Son   ? Anxiety disorder Daughter   ? ?  Social History:  reports that he quit smoking about 52 years ago. His smoking use included cigarettes. He has a 0.50 pack-year smoking history. He quit smokeless tobacco use about 22 years ago.  His smokeless tobacco use included chew. He reports that he does not currently use alcohol. He reports that he does not use drugs. ? ?Allergies:  ?Allergies  ?Allergen Reactions  ?  Benadryl [Diphenhydramine] Other (See Comments)  ?  anxious  ? Hctz [Hydrochlorothiazide]   ?  hyponatremia  ? Mepergan [Meperidine-Promethazine] Nausea And Vomiting  ? Other   ?  Antidepressants--per patient he cannot take.  ? Oxycodone Itching  ?  hyperactivity  ? Amoxicillin   ?  Has patient had a PCN reaction causing immediate rash, facial/tongue/throat swelling, SOB or lightheadedness with hypotension:No ?Has patient had a PCN reaction causing severe rash involving mucus membranes or skin necrosis:No ?Has patient had a PCN reaction that required hospitalization:No ?Has patient had a PCN reaction occurring within the last 10 years:Yes ?If all of the above answers are "NO", then may proceed with Cephalosporin use. ?"severe anxiety-ativan does not remedy"  ? Flagyl [Metronidazole] Anxiety  ? Singulair [Montelukast Sodium] Anxiety  ? ? ?Medications Prior to Admission  ?Medication Sig Dispense Refill  ? cimetidine (TAGAMET) 400 MG tablet Take 1 tablet (400 mg total) by mouth daily as needed. 30 tablet 5  ? fish oil-omega-3 fatty acids 1000 MG capsule Take 2 g by mouth daily after supper.    ? gabapentin (NEURONTIN) 600 MG tablet TAKE 1 TABLET BY MOUTH THREE TIMES DAILY (Patient taking differently: Take 600 mg by mouth in the morning and at bedtime.) 90 tablet 0  ? Iron-FA-B Cmp-C-Biot-Probiotic (FUSION PLUS) CAPS Take 1 capsule by mouth daily.    ? lisinopril (ZESTRIL) 40 MG tablet Take 40 mg by mouth daily.    ? LORazepam (ATIVAN) 1 MG tablet Take 1 tablet (1 mg total) by mouth every 6 (six) hours as needed for anxiety. (Patient taking differently: Take 1 mg by mouth every 8 (eight) hours as needed for anxiety.) 120 tablet 1  ? Multiple Vitamin (MULTIVITAMIN) tablet Take 1 tablet by mouth daily.    ? pantoprazole (PROTONIX) 40 MG tablet TAKE 1 TABLET BY MOUTH EVERY MORNING 30 tablet 11  ? polyethylene glycol-electrolytes (TRILYTE) 420 g solution Take 4,000 mLs by mouth as directed. 4000 mL 0  ? rOPINIRole (REQUIP)  0.25 MG tablet TAKE THREE TO FOUR TABLETS BY MOUTH AT BEDTIME (Patient taking differently: Take 0.5-2.25 mg by mouth as needed (restless legs).) 360 tablet 0  ? hydrOXYzine (VISTARIL) 25 MG capsule Take 25 mg by mouth daily as needed for itching.    ? ? ?No results found for this or any previous visit (from the past 48 hour(s)). ?No results found. ? ?Review of Systems ? ?Blood pressure (!) 166/87, pulse 79, temperature 98.4 ?F (36.9 ?C), temperature source Oral, resp. rate 16, height 5\' 5"  (1.651 m), weight 72.6 kg, SpO2 97 %. ?Physical Exam ?HENT:  ?   Mouth/Throat:  ?   Mouth: Mucous membranes are moist.  ?   Pharynx: Oropharynx is clear.  ?Eyes:  ?   General: No scleral icterus. ?   Conjunctiva/sclera: Conjunctivae normal.  ?Cardiovascular:  ?   Rate and Rhythm: Normal rate and regular rhythm.  ?   Heart sounds: Normal heart sounds. No murmur heard. ?Pulmonary:  ?   Effort: Pulmonary effort is normal.  ?   Breath sounds: Normal breath sounds.  ?Abdominal:  ?  General: There is no distension.  ?   Palpations: Abdomen is soft. There is no mass.  ?   Tenderness: There is no abdominal tenderness.  ?Musculoskeletal:     ?   General: No swelling.  ?   Cervical back: Neck supple.  ?Lymphadenopathy:  ?   Cervical: No cervical adenopathy.  ?Skin: ?   General: Skin is warm and dry.  ?Neurological:  ?   Mental Status: He is alert.  ?  ? ?Assessment/Plan ? ?History of colonic adenomas.   ?Surveillance colonoscopy  ? ?Hildred Laser, MD ?11/25/2021, 12:30 PM ? ? ? ?

## 2021-11-25 NOTE — Transfer of Care (Signed)
Immediate Anesthesia Transfer of Care Note ? ?Patient: Cameron Atkins ? ?Procedure(s) Performed: COLONOSCOPY WITH PROPOFOL ?POLYPECTOMY INTESTINAL ? ?Patient Location: Short Stay ? ?Anesthesia Type:General ? ?Level of Consciousness: awake and patient cooperative ? ?Airway & Oxygen Therapy: Patient Spontanous Breathing ? ?Post-op Assessment: Report given to RN and Post -op Vital signs reviewed and stable ? ?Post vital signs: Reviewed and stable ? ?Last Vitals:  ?Vitals Value Taken Time  ?BP 110/58 11/25/21 1304  ?Temp 36.6 ?C 11/25/21 1304  ?Pulse 69 11/25/21 1304  ?Resp 16 11/25/21 1304  ?SpO2 98 % 11/25/21 1304  ? ? ?Last Pain:  ?Vitals:  ? 11/25/21 1304  ?TempSrc: Oral  ?PainSc: 0-No pain  ?   ? ?Patients Stated Pain Goal: 6 (11/25/21 1152) ? ?Complications: No notable events documented. ?

## 2021-11-25 NOTE — Addendum Note (Signed)
Addendum  created 11/25/21 1448 by Denese Killings, MD  ? Clinical Note Signed  ?  ?

## 2021-11-27 LAB — SURGICAL PATHOLOGY

## 2021-11-30 ENCOUNTER — Encounter (HOSPITAL_COMMUNITY): Payer: Self-pay | Admitting: Internal Medicine

## 2021-12-07 DIAGNOSIS — R972 Elevated prostate specific antigen [PSA]: Secondary | ICD-10-CM | POA: Diagnosis not present

## 2021-12-07 DIAGNOSIS — C61 Malignant neoplasm of prostate: Secondary | ICD-10-CM | POA: Diagnosis not present

## 2021-12-14 ENCOUNTER — Other Ambulatory Visit (HOSPITAL_COMMUNITY): Payer: Self-pay | Admitting: Urology

## 2021-12-14 DIAGNOSIS — C61 Malignant neoplasm of prostate: Secondary | ICD-10-CM

## 2021-12-28 ENCOUNTER — Ambulatory Visit (HOSPITAL_COMMUNITY)
Admission: RE | Admit: 2021-12-28 | Discharge: 2021-12-28 | Disposition: A | Discharge status: Home / Self Care | Payer: Medicare Other | Source: Ambulatory Visit | Attending: Urology | Admitting: Urology

## 2021-12-28 DIAGNOSIS — C61 Malignant neoplasm of prostate: Secondary | ICD-10-CM | POA: Insufficient documentation

## 2021-12-28 MED ORDER — PIFLIFOLASTAT F 18 (PYLARIFY) INJECTION
9.0000 | Freq: Once | INTRAVENOUS | Status: AC
  Administered 2021-12-28: 9.6 via INTRAVENOUS

## 2022-01-06 DIAGNOSIS — R972 Elevated prostate specific antigen [PSA]: Secondary | ICD-10-CM | POA: Diagnosis not present

## 2022-01-12 DIAGNOSIS — J392 Other diseases of pharynx: Secondary | ICD-10-CM | POA: Diagnosis not present

## 2022-01-12 DIAGNOSIS — J069 Acute upper respiratory infection, unspecified: Secondary | ICD-10-CM | POA: Diagnosis not present

## 2022-01-12 DIAGNOSIS — R935 Abnormal findings on diagnostic imaging of other abdominal regions, including retroperitoneum: Secondary | ICD-10-CM | POA: Diagnosis not present

## 2022-01-12 DIAGNOSIS — I700 Atherosclerosis of aorta: Secondary | ICD-10-CM | POA: Diagnosis not present

## 2022-01-13 DIAGNOSIS — N401 Enlarged prostate with lower urinary tract symptoms: Secondary | ICD-10-CM | POA: Diagnosis not present

## 2022-01-13 DIAGNOSIS — R351 Nocturia: Secondary | ICD-10-CM | POA: Diagnosis not present

## 2022-01-13 DIAGNOSIS — C61 Malignant neoplasm of prostate: Secondary | ICD-10-CM | POA: Diagnosis not present

## 2022-01-13 DIAGNOSIS — N5201 Erectile dysfunction due to arterial insufficiency: Secondary | ICD-10-CM | POA: Diagnosis not present

## 2022-01-22 NOTE — Progress Notes (Signed)
I called pt to introduce myself as the Prostate Nurse Navigator and the Coordinator of the Prostate [place]. ?  ?1. I confirmed with the patient he is aware of his referral to the clinic 5/9, arriving @ 12:30 pm.  ?  ?2. I discussed the format of the clinic and the physicians he will be seeing that day. ?  ?3. I discussed where the clinic is located and how to contact me. ?  ?4. I confirmed his address and informed him I would be mailing a packet of information and forms to be completed. I asked him to bring them with him the day of his appointment.  ?  ?He voiced understanding of the above. I asked him to call me if he has any questions or concerns regarding his appointments or the forms he needs to complete.  ?

## 2022-02-01 NOTE — Progress Notes (Addendum)
? ?                              Care Plan Summary ? ?Name: Cameron Atkins ?DOB: 1946/09/17  ? ?Your Medical Team:  ? Urologist -  Dr. Raynelle Bring, [facility] ? Radiation Oncologist - Dr. Tyler Pita, [place]  ? Medical Oncologist - Dr. Zola Button, [place] ? ?Recommendations: ?1) Androgen Deprivation Therapy (ADT)   ?2) Radiation   ? ? ?* These recommendations are based on information available as of today?s consult.      Recommendations may change depending on the results of further tests or exams. ? ? ? ?Next Steps: ?1) [facility] will contact you in regarding to starting ADT.  ?2) The radiation department at [facility] will contact you to set up daily radiation.  ? ? ?When appointments need to be scheduled, you will be contacted by [facility] and/or [facility]. ? ?Questions?  Please do not hesitate to call Katheren Puller, BSN, RN at [phone] with any questions or concerns.  Kathlee Nations is your Oncology Nurse Navigator and is available to assist you while you?re receiving your medical care at [facility].   ?

## 2022-02-01 NOTE — Progress Notes (Signed)
Left message for appointment reminder for upcoming [place] on 5/9.  ?

## 2022-02-02 ENCOUNTER — Inpatient Hospital Stay: Payer: Medicare Other | Attending: Radiation Oncology

## 2022-02-02 ENCOUNTER — Other Ambulatory Visit: Payer: Self-pay

## 2022-02-02 ENCOUNTER — Ambulatory Visit
Admission: RE | Admit: 2022-02-02 | Discharge: 2022-02-02 | Disposition: A | Discharge status: Home / Self Care | Payer: Medicare Other | Source: Ambulatory Visit | Attending: Radiation Oncology | Admitting: Radiation Oncology

## 2022-02-02 ENCOUNTER — Inpatient Hospital Stay (HOSPITAL_BASED_OUTPATIENT_CLINIC_OR_DEPARTMENT_OTHER): Payer: Medicare Other | Admitting: Oncology

## 2022-02-02 ENCOUNTER — Ambulatory Visit (HOSPITAL_BASED_OUTPATIENT_CLINIC_OR_DEPARTMENT_OTHER): Payer: Medicare Other | Admitting: Genetic Counselor

## 2022-02-02 VITALS — BP 154/92 | HR 64 | Temp 96.5°F | Resp 18 | Ht 65.0 in | Wt 163.0 lb

## 2022-02-02 DIAGNOSIS — Z87891 Personal history of nicotine dependence: Secondary | ICD-10-CM

## 2022-02-02 DIAGNOSIS — C61 Malignant neoplasm of prostate: Secondary | ICD-10-CM

## 2022-02-02 DIAGNOSIS — Z803 Family history of malignant neoplasm of breast: Secondary | ICD-10-CM | POA: Insufficient documentation

## 2022-02-02 DIAGNOSIS — N400 Enlarged prostate without lower urinary tract symptoms: Secondary | ICD-10-CM | POA: Diagnosis not present

## 2022-02-02 DIAGNOSIS — Z8546 Personal history of malignant neoplasm of prostate: Secondary | ICD-10-CM | POA: Diagnosis not present

## 2022-02-02 DIAGNOSIS — I10 Essential (primary) hypertension: Secondary | ICD-10-CM | POA: Diagnosis not present

## 2022-02-02 DIAGNOSIS — Z191 Hormone sensitive malignancy status: Secondary | ICD-10-CM | POA: Diagnosis not present

## 2022-02-02 LAB — GENETIC SCREENING ORDER

## 2022-02-02 NOTE — Progress Notes (Signed)
Radiation Oncology         [phone] ________________________________  Multidisciplinary Prostate Cancer Clinic  Initial Radiation Oncology Consultation  Name: Cameron Atkins MRN: [id number]  Date: 02/02/2022  DOB: [birth year]  WU:[id number], Edwinna Areola, MD  Irine Seal, MD   REFERRING PHYSICIAN: Irine Seal, MD  DIAGNOSIS: 76 y.o. gentleman with stage T1c adenocarcinoma of the prostate with a Gleason's score of 4+4 and a PSA of 5.87    ICD-10-CM   1. Malignant neoplasm of prostate ([place])  [place] is a 76 y.o. gentleman. He has a history of BPH with LUTS and BOO, s/p Urolift procedure on 09/24/19. Since that time, he has had a rising PSA, going from 2.45 in 01/2020 to 5.87 most recently in 10/2021. At a follow up visit on 10/05/21, digital rectal examination was performed revealing no nodules. The patient proceeded to transrectal ultrasound with 12 biopsies of the prostate on 12/07/21.  The prostate volume measured 34 cc.  Out of 12 core biopsies, 2 were positive.  The maximum Gleason score was 4+4, and this was seen in left base. Additionally, a small focus of Gleason 3+3 was seen in right base lateral.     He underwent PSMA PET scan on 12/28/21 showing multifocal, relatively-mild, prostatic uptake and a left inguinal node with low-level hypermetabolism, favored benign/reactive. Otherwise, no tracer-avid visceral or osseous metastases noted. Repeat PSA obtained on 01/07/22 showed a slight decrease to 4.95.  The patient reviewed the biopsy results with his urologist and he has kindly been referred today to the multidisciplinary prostate cancer clinic for presentation of pathology and radiology studies in our conference for discussion of potential radiation treatment options and clinical evaluation.  PREVIOUS RADIATION THERAPY: No  PAST MEDICAL HISTORY:  has a past medical history of Anxiety, Arthritis, Bladder stone, Depression, Diverticulosis of colon, GERD  (gastroesophageal reflux disease), H/O hiatal hernia, Helicobacter pylori gastritis, History of colonoscopy with polypectomy, History of esophageal dilatation, Hypertension, IDA (iron deficiency anemia) (11/22/2018), Left ureteral calculus, Osteoporosis, and Wears glasses.    PAST SURGICAL HISTORY: Past Surgical History:  Procedure Laterality Date   BIOPSY  09/10/2016   Procedure: BIOPSY;  Surgeon: Rogene Houston, MD;  [facility];  Service: Endoscopy;;  rectal polyp   COLONOSCOPY WITH PROPOFOL N/A 09/10/2016   Procedure: COLONOSCOPY WITH PROPOFOL;  Surgeon: Rogene Houston, MD;  [facility];  Service: Endoscopy;  Laterality: N/A;   COLONOSCOPY WITH PROPOFOL N/A 11/25/2021   Procedure: COLONOSCOPY WITH PROPOFOL;  Surgeon: Rogene Houston, MD;  [facility];  Service: Endoscopy;  Laterality: N/A;  1:15  ASA 1   CYSTO/  URETEROSCOPIC LITHOTRIPSY/ STONE EXTRACTION  1995   CYSTOSCOPY W/ RETROGRADES Right 01/15/2014   Procedure: CYSTOSCOPY WITH RIGHT RETROGRADE PYELOGRAM;  Surgeon: Irine Seal, MD;  Location: [facility];  Service: Urology;  Laterality: Right;   CYSTOSCOPY WITH RETROGRADE PYELOGRAM, URETEROSCOPY AND STENT PLACEMENT Left 01/15/2014   Procedure: CYSTOSCOPY , LEFT RETROGRADE PYELOGRAM, LEFT URETEROSCOPY, BASKET STONE EXTRACTION;  Surgeon: Irine Seal, MD;  Location[facility];  Service: Urology;  Laterality: Left;   ESOPHAGEAL DILATION  03/21/2013   Procedure: ESOPHAGEAL DILATION;  Surgeon: Danie Binder, MD;  [facility];  Service: Endoscopy;;   ESOPHAGEAL DILATION N/A 09/10/2016   Procedure: ESOPHAGEAL DILATION;  Surgeon: Rogene Houston, MD;  [facility];  Service: Endoscopy;  Laterality: N/A;  ESOPHAGOGASTRODUODENOSCOPY N/A 03/21/2013   Procedure: ESOPHAGOGASTRODUODENOSCOPY (EGD);  Surgeon: Danie Binder, MD;  [facility];  Service: Endoscopy;  Laterality: N/A;    ESOPHAGOGASTRODUODENOSCOPY (EGD) WITH ESOPHAGEAL DILATION N/A 04/26/2013   Procedure: ESOPHAGOGASTRODUODENOSCOPY (EGD) WITH ESOPHAGEAL DILATION;  Surgeon: Rogene Houston, MD;  [facility];  Service: Endoscopy;  Laterality: N/A;  130   ESOPHAGOGASTRODUODENOSCOPY (EGD) WITH PROPOFOL N/A 09/10/2016   Procedure: ESOPHAGOGASTRODUODENOSCOPY (EGD) WITH PROPOFOL;  Surgeon: Rogene Houston, MD;  [facility];  Service: Endoscopy;  Laterality: N/A;  10:20   EXTRACORPOREAL SHOCK WAVE LITHOTRIPSY  X2   YRS AGO   HOLMIUM LASER APPLICATION Left 6/57/8469   Procedure: HOLMIUM LASER APPLICATION, LEFT URETER;  Surgeon: Irine Seal, MD;  Location: [facility];  Service: Urology;  Laterality: Left;   POLYPECTOMY  11/25/2021   Procedure: POLYPECTOMY INTESTINAL;  Surgeon: Rogene Houston, MD;  [facility];  Service: Endoscopy;;   SHOULDER ARTHROSCOPY W/ SUBACROMIAL DECOMPRESSION AND DISTAL CLAVICLE EXCISION Left 2000    FAMILY HISTORY: family history includes Anxiety disorder in his daughter, father, and son; Breast cancer in his maternal aunt, maternal aunt, maternal aunt, and paternal grandmother; Breast cancer (age of onset: 66) in his sister.  SOCIAL HISTORY:  reports that he quit smoking about 53 years ago. His smoking use included cigarettes. He has a 0.50 pack-year smoking history. He quit smokeless tobacco use about 23 years ago.  His smokeless tobacco use included chew. He reports that he does not currently use alcohol. He reports that he does not use drugs.  ALLERGIES: Benadryl [diphenhydramine], Hctz [hydrochlorothiazide], Mepergan [meperidine-promethazine], Other, Oxycodone, Amoxicillin, Flagyl [metronidazole], and Singulair [montelukast sodium]  MEDICATIONS:  Current Outpatient Medications  Medication Sig Dispense Refill   cimetidine (TAGAMET) 400 MG tablet Take 1 tablet (400 mg total) by mouth daily as needed. 30 tablet 5   fish oil-omega-3 fatty acids  1000 MG capsule Take 2 g by mouth daily after supper.     gabapentin (NEURONTIN) 600 MG tablet TAKE 1 TABLET BY MOUTH THREE TIMES DAILY (Patient taking differently: Take 600 mg by mouth in the morning and at bedtime.) 90 tablet 0   hydrOXYzine (VISTARIL) 25 MG capsule Take 25 mg by mouth daily as needed for itching.     Iron-FA-B Cmp-C-Biot-Probiotic (FUSION PLUS) CAPS Take 1 capsule by mouth daily.     lisinopril (ZESTRIL) 40 MG tablet Take 40 mg by mouth daily.     LORazepam (ATIVAN) 1 MG tablet Take 1 tablet (1 mg total) by mouth every 6 (six) hours as needed for anxiety. (Patient taking differently: Take 1 mg by mouth every 8 (eight) hours as needed for anxiety.) 120 tablet 1   Multiple Vitamin (MULTIVITAMIN) tablet Take 1 tablet by mouth daily.     pantoprazole (PROTONIX) 40 MG tablet TAKE 1 TABLET BY MOUTH EVERY MORNING 30 tablet 11   rOPINIRole (REQUIP) 0.25 MG tablet TAKE THREE TO FOUR TABLETS BY MOUTH AT BEDTIME (Patient taking differently: Take 0.5-2.25 mg by mouth as needed (restless legs).) 360 tablet 0   No current facility-administered medications for this encounter.    REVIEW OF SYSTEMS:  On review of systems, the patient reports that he is doing well overall. He denies any chest pain, shortness of breath, cough, fevers, chills, night sweats, unintended weight changes. He denies any bowel disturbances, and denies abdominal pain, nausea or vomiting. He denies any new musculoskeletal or joint aches or pains. His IPSS was 6, indicating mild  urinary symptoms. His SHIM was 5, indicating he has severe erectile dysfunction. A complete review of systems is obtained and is otherwise negative.   PHYSICAL EXAM:  Wt Readings from Last 3 Encounters:  02/02/22 163 lb (73.9 kg)  11/25/21 160 lb 0.9 oz (72.6 kg)  11/24/21 160 lb (72.6 kg)   Temp Readings from Last 3 Encounters:  02/02/22 (!) 96.5 F (35.8 C) (Oral)  11/25/21 97.8 F (36.6 C) (Oral)  10/15/21 98.7 F (37.1 C) (Oral)   BP  Readings from Last 3 Encounters:  02/02/22 (!) 154/92  11/25/21 (!) 110/58  10/15/21 (!) 169/89   Pulse Readings from Last 3 Encounters:  02/02/22 64  11/25/21 69  10/15/21 71    /10  In general this is a well appearing Caucasian male in no acute distress.  He's alert and oriented x4 and appropriate throughout the examination. Cardiopulmonary assessment is negative for acute distress and he exhibits normal effort.    KPS = 100  100 - Normal; no complaints; no evidence of disease. 90   - Able to carry on normal activity; minor signs or symptoms of disease. 80   - Normal activity with effort; some signs or symptoms of disease. 21   - Cares for self; unable to carry on normal activity or to do active work. 60   - Requires occasional assistance, but is able to care for most of his personal needs. 50   - Requires considerable assistance and frequent medical care. 73   - Disabled; requires special care and assistance. 28   - Severely disabled; hospital admission is indicated although death not imminent. 63   - Very sick; hospital admission necessary; active supportive treatment necessary. 10   - Moribund; fatal processes progressing rapidly. 0     - Dead  Karnofsky DA, Abelmann [place], Craver LS and Burchenal [facility] [phone]) The use of the nitrogen mustards in the palliative treatment of carcinoma: with particular reference to bronchogenic carcinoma Cancer 1 634-56   LABORATORY DATA:  Lab Results  Component Value Date   WBC 6.9 11/20/2018   HGB 12.2 (L) 12/13/2018   HCT 35.6 (L) 12/13/2018   MCV 86 11/20/2018   PLT 300 11/20/2018   Lab Results  Component Value Date   NA 136 11/20/2018   K 4.6 11/20/2018   CL 103 11/20/2018   CO2 20 11/20/2018   Lab Results  Component Value Date   ALT 16 07/31/2018   AST 20 07/31/2018   ALKPHOS 66 07/31/2018   BILITOT 0.9 07/31/2018     RADIOGRAPHY: No results found.    IMPRESSION/PLAN: 76 y.o. gentleman with Stage T1c adenocarcinoma of the  prostate with a Gleason score of 4+4 and a PSA of 4.95.    We discussed the patient's workup and outlined the nature of prostate cancer in this setting. The patient's T stage, Gleason's score, and PSA put him into the high risk group. Accordingly, he is eligible for a variety of potential treatment options including prostatectomy or LT-ADT with either 8 weeks of external radiation or 5 weeks of external radiation with an upfront brachytherapy boost. We discussed the available radiation techniques, and focused on the details and logistics of delivery. He is not an ideal candidate for brachytherapy boost in light of his previous Urolift procedure. Therefore, we discussed and outlined the risks, benefits, short and long-term effects associated with daily external beam radiotherapy and compared and contrasted these with prostatectomy. We discussed the role of SpaceOAR gel in reducing  the rectal toxicity associated with radiotherapy. We also detailed the role of ADT in the treatment of high risk prostate cancer and outlined the associated side effects that could be expected with this therapy.   He was encouraged to ask questions that were answered to his stated satisfaction.  At the end of the conversation the patient is interested in moving forward with the recommended 8 week course of external beam therapy concurrent with ADT but prefers to have his treatments closer to his home in [place], [place]. We will make a referral for consult with Dr. Lynnette Caffey at [facility] and share our discussion with Dr. Jeffie Pollock. He will need a follow-up visit at [facility], first available, to start ADT now.  He will not need fiducial markers since he has the Urolift struts in place but, will likely still need SpaceOAR gel placement in July 2023, prior to simulation, to reduce rectal toxicity from radiotherapy, pending Dr. Lynnette Caffey' preference. The patient appears to have a good understanding of his disease and our treatment  recommendations which are of curative intent and is in agreement with the stated plan.  Therefore, we will move forward with treatment planning accordingly, in anticipation of beginning IMRT approximately 2 months after starting ADT.  We personally spent 60 minutes in this encounter including chart review, reviewing radiological studies, meeting face-to-face with the patient, entering orders and completing documentation.    Nicholos Johns, PA-C    Tyler Pita, MD  [facility] Direct Dial: [phone]  Fax: [phone] [email or web address]  Skype  [email or web address]   This document serves as a record of services personally performed by Tyler Pita, MD and Freeman Caldron, PA-C. It was created on their behalf by Wilburn Mylar, a trained medical scribe. The creation of this record is based on the scribe's personal observations and the provider's statements to them. This document has been checked and approved by the attending provider.

## 2022-02-02 NOTE — Consult Note (Signed)
? ? ?[facility]     02/02/2022  ? ?-------------------------------------------------------------------------------- ?  ?Hyde A. Shakir  ?MRN: [id number]  ?DOB: 10-06-1945, 76 year old Male  ?SSN: [id number]  ? PRIMARY CARE:  Selinda Flavin Bluthdeceased  ?REFERRING:  Irine Seal, MD  ?PROVIDER:  Irine Seal, M.D.  ?TREATING:  Raynelle Bring, M.D.  ?LOCATION:  [facility] [phone]  ?  ? ?-------------------------------------------------------------------------------- ?  ?CC/HPI: CC: Prostate Cancer  ? ?Physician requesting consult: Dr. Irine Seal  ?PCP: Dr. Wende Neighbors  ?Location of consult: [facility]  ? ?Mr. Thier is a 76 year old gentleman with a history of GERD, anxiety, and hypertension who has a history of BPH s/p treatment with Urolift in December 2020. He was recently noted to have a persistently elevated PSA over 5 that was then 4.95 when rechecked. A TRUS biopsy of the prostate was performed on 12/07/21 indicated Gleason 4+4=8 adenocarcinoma with 2 out of 12 biopsy cores positive for malignancy.  ? ?Family history: None.  ? ?Imaging studies: PSMA PET scan (12/28/21): Negative for malignancy.  ? ?PMH: He has a history of GERD, anxiety, and hypertension.  ?PSH: No abdominal surgeries. Urolift.  ? ?TNM stage: cT1c N0 M0  ?PSA: 4.95  ?Gleason score: 4+4=8 (GG 4)  ?Biopsy (12/07/21): 2/12 cores positive  ?Left: L base (30%, 4+4=8)  ?Right: R lateral base (3%, 3+3=6)  ?Prostate volume: 34 cc  ? ?Nomogram  ?OC disease: 51%  ?EPE: 48%  ?SVI: 8%  ?LNI: 9%  ?PFS (5 year, 10 year): 61%, 45%  ? ?Urinary function: IPSS is 6.  ?Erectile function: SHIM score is 5.  ? ?  ?ALLERGIES: Benadryl TABS ?Hydrocodone ?oxycodone ?Sulfa Drugs ?  ?  Notes: "allergic to most antibiotics, hard to breathe" "ok with doxycycline"  ? ?MEDICATIONS: Cimetidine  ?Fish Oil  ?Gabapentin 600 mg tablet Oral  ?Lisinopril 30 mg tablet Oral  ?Lorazepam 2 mg tablet Oral  ?Multiple  Vitamin  ?Multivitamin  ?Pantoprazole Sodium 40 mg tablet, delayed release Oral  ?Ropinirole Hcl 0.25 mg tablet  ?Vitamin D3  ?  ? ?GU PSH: Complex Uroflow - 03/24/2020, 2021, 2020 ?Cysto Uretero Lithotripsy - 2015 ?Cystoscopy - 12/29/2020, 03/24/2020, 2020 ?Cystoscopy Insert Stent - 2015 ?Cystoscopy Ureteroscopy - 2015 ?ESWL - 2015 ?Prostate Needle Biopsy - 12/07/2021 ?UROLIFT - 09/24/2019 ?UROLIFT - 09/24/2019 ? ?  ?   ?PSH Notes: Cystoscopy With Ureteroscopy With Lithotripsy, Cystoscopy With Insertion Of Ureteral Stent Right, Cystoscopy With Ureteroscopy, Shoulder Surgery Left, Lithotripsy  ? ?NON-GU PSH: Surgical Pathology, Gross And Microscopic Examination For Prostate Needle - 12/07/2021 ? ?  ? ?GU PMH: BPH w/LUTS, He has moderate LUTS with a prior Urolift. - 01/13/2022, His symptoms have gotten a little worse over the last few months. I discussed resuming meds but he is ok for now. , - 10/05/2021, He is doing better and the Urolift tabs appear to have epithelialized. he will return in 88mo , - 12/29/2020, - 11/17/2020, He continues to have LUTS and pelvic pain and irritation. He would like to try daily tadalafil again so that was dispensed. I recommended he consider PT and will set him up for a consultation. He will return next available for cystoscopy to reassess the Urolift which had not re-epithelialized last June., - 11/05/2020, - 07/15/2020, - 09/24/2019 (Stable), I am going to have him return for a flowrate, PVR and cystoscopy to assess for Urolift vs Rezum. I will a PSA today. , - 2020, -  2017 ?ED due to arterial insufficiency, He has had progressive ED and is no longer active. - 01/13/2022, He will try the tadalafil. , - 11/05/2020, We can address this in the future if need be. , - 2020 ?Nocturia - 01/13/2022, - 10/05/2021 ?Prostate Cancer, T1c N0 M0 low volume GG4 high risk prostate cancer in a 45m prostate. I discussed the management options including active surveillance, RALP and Radiation including EXRT and possible  Brachytherapy monotherapy vs combo therapy possibly with ADT. I also mentioned cryotherapy. He is 77but in reasonably good health. His predicted 10 yr PCSM is about 5% with conservative management and 3% with radical therapy, so surveillance is not an unreasonable options. I would be inclinded to consider a Prolaris test if he would like to consider surveillance. He is a possible candidate for surgery but he has had a prior Urolift which my impact the dissection. I reviewed the risks in detail including the possible need for a node dissection. I expressed my concern that older individuals were at higher risk for surgical complications and have a higher rate of persistent incontinence. I reviewed the radiation options and their attendant risks as well as the added risk and potential benefits of SpaceOAR. He is definitely a candidate for EXRT possibly with ADT but I am not sure the potential benefit of ADT would outway the potential risks and side effects with his very low volume disease. I explained that he might be able to have brachytherapy monotherapy since has low volume diseas but with the Gleason 8 grade, I would defer to radiation oncologist as to whether combination therapy and ADT are necessary. I briefly discussed cryo and HIFU but didn't recommend those as primary options. I have given him handouts about prostate cancer management and will get him set up with the [facility] [place] encouraged him to bring his wife as the information load was high and I think he could use an extra set of ears for the discussion. - 01/13/2022 ?Elevated PSA - 12/07/2021, His PSA was up to 3.7 in 11/22 from 2.45 in 2021. I will get a repeat today and if elevated further, I will get him set up for further evaluation. If it is down, I will repeat it in 3 months. , - 10/05/2021 ?Renal calculus, He has a right renal stone on prior imaging but no flank pain or hematuria. - 10/05/2021, - 11/05/2020 (Stable), He has had stable stones with no worrisome  symptoms. I will just have him return in a year with a KUB. , - 2019, Stable right renal stones. I don't believe he will need repeat imaging at f/u. , - 2018, - 2017, Calculus of kidney, - 2015 ?Urinary Urgency - 10/05/2021, - 12/29/2020, - 11/26/2020, - 07/15/2020 (Stable), - 02/06/2020, - 2021 ?Pelvic/perineal pain - 12/29/2020, - 11/26/2020, - 11/17/2020, I am not sure what is causing his pain and suggested he see his GI doc for additional evaluation. , - 11/05/2020 ?Weak Urinary Stream - 12/29/2020, - 11/05/2020 (Stable), - 07/15/2020, - 2017 ?Post-void dribbling - 11/26/2020 ?Lower abdominal pain, unspecified, Left groin pain - 2015 ?Ureteral calculus, Calculus of left ureter - 2015 ?Bladder Stone, Bladder calculus - 2015 ?Renal cyst, Renal cysts, acquired, bilateral - 2015 ?  ? ?NON-GU PMH: Muscle weakness (generalized) - 11/26/2020, - 11/17/2020 ?Other muscle spasm - 11/26/2020, - 11/17/2020 ?Other specified disorders of muscle - 11/17/2020 ?Decreased libido, I will get a testosterone level today. - 2020 ?Encounter for general adult medical examination without abnormal findings, Encounter  for preventive health examination - 2015 ?Anxiety, Anxiety - 2015 ?Personal history of other diseases of the circulatory system, History of hypertension - 2015 ?Personal history of other diseases of the digestive system, History of esophageal reflux - 2015 ?Personal history of other diseases of the musculoskeletal system and connective tissue, History of arthritis - 2015 ?Personal history of other diseases of the respiratory system, History of asthma - 2015 ?Personal history of other mental and behavioral disorders, History of depression - 2015 ?Diverticulitis ?  ? ?FAMILY HISTORY: Deceased - Runs In Family ?No pertinent family history - Runs In Family  ? ?SOCIAL HISTORY: Marital Status: Married ?  ?  Notes: Alcohol use, Number of children, Former smoker, Married, Occupation, Caffeine use  ? ?REVIEW OF SYSTEMS:    ?GU Review Male:   Patient denies  frequent urination, hard to postpone urination, burning/ pain with urination, get up at night to urinate, leakage of urine, stream starts and stops, trouble starting your streams, and have to strain t

## 2022-02-02 NOTE — Progress Notes (Signed)
?Reason for the request:    Prostate cancer ? ?HPI: I was asked by Dr. Jeffie Pollock to evaluate Cameron Atkins for the evaluation of prostate cancer.  He is a 76 year old man with a history of BPH she was found to have an elevated PSA of 5.87.  He subsequently underwent prostate biopsy on December 07, 2021 which showed Gleason score 4+4 = 8 and 1 core of 30% and another core of Gleason score 6.  PSMA PET scan was obtained on April 3 and showed no evidence of metastatic disease.  Clinically, he reports generalized fatigue and tiredness but no major urinary complaints.  He reports his 50% of the time has sensation of not emptying his bladder although less likely symptoms of frequency urgency or nocturia.  He denies any hematuria or dysuria. ? ?He does not report any headaches, blurry vision, syncope or seizures. Does not report any fevers, chills or sweats.  Does not report any cough, wheezing or hemoptysis.  Does not report any chest pain, palpitation, orthopnea or leg edema.  Does not report any nausea, vomiting or abdominal pain.  Does not report any constipation or diarrhea.  Does not report any skeletal complaints.    Does not report frequency, urgency or hematuria.  Does not report any skin rashes or lesions. Does not report any heat or cold intolerance.  Does not report any lymphadenopathy or petechiae.  Does not report any anxiety or depression.  Remaining review of systems is negative.  ? ? ? ?Past Medical History:  ?Diagnosis Date  ? Anxiety   ? Arthritis   ? Bladder stone   ? Depression   ? Diverticulosis of colon   ? GERD (gastroesophageal reflux disease)   ? H/O hiatal hernia   ? Helicobacter pylori gastritis   ? History of colonoscopy with polypectomy   ? TUBULAR ADENOMA-- 2012  ? History of esophageal dilatation   ? STRICTURE--  LAST DONE 03/2013  ? Hypertension   ? for 5 yrs  ? IDA (iron deficiency anemia) 11/22/2018  ? Left ureteral calculus   ? Osteoporosis   ? Wears glasses   ?: ? ? ?Past Surgical History:   ?Procedure Laterality Date  ? BIOPSY  09/10/2016  ? Procedure: BIOPSY;  Surgeon: Rogene Houston, MD;  [facility];  Service: Endoscopy;;  rectal polyp  ? COLONOSCOPY WITH PROPOFOL N/A 09/10/2016  ? Procedure: COLONOSCOPY WITH PROPOFOL;  Surgeon: Rogene Houston, MD;  [facility];  Service: Endoscopy;  Laterality: N/A;  ? COLONOSCOPY WITH PROPOFOL N/A 11/25/2021  ? Procedure: COLONOSCOPY WITH PROPOFOL;  Surgeon: Rogene Houston, MD;  [facility];  Service: Endoscopy;  Laterality: N/A;  1:15  ASA 1  ? CYSTO/  URETEROSCOPIC LITHOTRIPSY/ STONE EXTRACTION  1995  ? CYSTOSCOPY W/ RETROGRADES Right 01/15/2014  ? Procedure: CYSTOSCOPY WITH RIGHT RETROGRADE PYELOGRAM;  Surgeon: Irine Seal, MD;  Location[facility];  Service: Urology;  Laterality: Right;  ? CYSTOSCOPY WITH RETROGRADE PYELOGRAM, URETEROSCOPY AND STENT PLACEMENT Left 01/15/2014  ? Procedure: CYSTOSCOPY , LEFT RETROGRADE PYELOGRAM, LEFT URETEROSCOPY, BASKET STONE EXTRACTION;  Surgeon: Irine Seal, MD;  Location: [facility];  Service: Urology;  Laterality: Left;  ? ESOPHAGEAL DILATION  03/21/2013  ? Procedure: ESOPHAGEAL DILATION;  Surgeon: Danie Binder, MD;  [facility];  Service: Endoscopy;;  ? ESOPHAGEAL DILATION N/A 09/10/2016  ? Procedure: ESOPHAGEAL DILATION;  Surgeon: Rogene Houston, MD;  [facility];  Service: Endoscopy;  Laterality: N/A;  ? ESOPHAGOGASTRODUODENOSCOPY N/A 03/21/2013  ? Procedure: ESOPHAGOGASTRODUODENOSCOPY (EGD);  Surgeon: Danie Binder, MD;  [facility];  Service: Endoscopy;  Laterality: N/A;  ? ESOPHAGOGASTRODUODENOSCOPY (EGD) WITH ESOPHAGEAL DILATION N/A 04/26/2013  ? Procedure: ESOPHAGOGASTRODUODENOSCOPY (EGD) WITH ESOPHAGEAL DILATION;  Surgeon: Rogene Houston, MD;  [facility];  Service: Endoscopy;  Laterality: N/A;  130  ? ESOPHAGOGASTRODUODENOSCOPY (EGD) WITH PROPOFOL N/A 09/10/2016  ? Procedure:  ESOPHAGOGASTRODUODENOSCOPY (EGD) WITH PROPOFOL;  Surgeon: Rogene Houston, MD;  [facility];  Service: Endoscopy;  Laterality: N/A;  10:20  ? EXTRACORPOREAL SHOCK WAVE LITHOTRIPSY  X2   YRS AGO  ? HOLMIUM LASER APPLICATION Left 03/22/9484  ? Procedure: HOLMIUM LASER APPLICATION, LEFT URETER;  Surgeon: Irine Seal, MD;  Location[facility];  Service: Urology;  Laterality: Left;  ? POLYPECTOMY  11/25/2021  ? Procedure: POLYPECTOMY INTESTINAL;  Surgeon: Rogene Houston, MD;  [facility];  Service: Endoscopy;;  ? SHOULDER ARTHROSCOPY W/ SUBACROMIAL DECOMPRESSION AND DISTAL CLAVICLE EXCISION Left 2000  ?: ? ? ?Current Outpatient Medications:  ?  cimetidine (TAGAMET) 400 MG tablet, Take 1 tablet (400 mg total) by mouth daily as needed., Disp: 30 tablet, Rfl: 5 ?  fish oil-omega-3 fatty acids 1000 MG capsule, Take 2 g by mouth daily after supper., Disp: , Rfl:  ?  gabapentin (NEURONTIN) 600 MG tablet, TAKE 1 TABLET BY MOUTH THREE TIMES DAILY (Patient taking differently: Take 600 mg by mouth in the morning and at bedtime.), Disp: 90 tablet, Rfl: 0 ?  hydrOXYzine (VISTARIL) 25 MG capsule, Take 25 mg by mouth daily as needed for itching., Disp: , Rfl:  ?  Iron-FA-B Cmp-C-Biot-Probiotic (FUSION PLUS) CAPS, Take 1 capsule by mouth daily., Disp: , Rfl:  ?  lisinopril (ZESTRIL) 40 MG tablet, Take 40 mg by mouth daily., Disp: , Rfl:  ?  LORazepam (ATIVAN) 1 MG tablet, Take 1 tablet (1 mg total) by mouth every 6 (six) hours as needed for anxiety. (Patient taking differently: Take 1 mg by mouth every 8 (eight) hours as needed for anxiety.), Disp: 120 tablet, Rfl: 1 ?  Multiple Vitamin (MULTIVITAMIN) tablet, Take 1 tablet by mouth daily., Disp: , Rfl:  ?  pantoprazole (PROTONIX) 40 MG tablet, TAKE 1 TABLET BY MOUTH EVERY MORNING, Disp: 30 tablet, Rfl: 11 ?  rOPINIRole (REQUIP) 0.25 MG tablet, TAKE THREE TO FOUR TABLETS BY MOUTH AT BEDTIME (Patient taking differently: Take 0.5-2.25 mg by mouth as  needed (restless legs).), Disp: 360 tablet, Rfl: 0: ? ? ?Allergies  ?Allergen Reactions  ? Benadryl [Diphenhydramine] Other (See Comments)  ?  anxious  ? Hctz [Hydrochlorothiazide]   ?  hyponatremia  ? Mepergan [Meperidine-Promethazine] Nausea And Vomiting  ? Other   ?  Antidepressants--per patient he cannot take.  ? Oxycodone Itching  ?  hyperactivity  ? Amoxicillin   ?  Has patient had a PCN reaction causing immediate rash, facial/tongue/throat swelling, SOB or lightheadedness with hypotension:No ?Has patient had a PCN reaction causing severe rash involving mucus membranes or skin necrosis:No ?Has patient had a PCN reaction that required hospitalization:No ?Has patient had a PCN reaction occurring within the last 10 years:Yes ?If all of the above answers are "NO", then may proceed with Cephalosporin use. ?"severe anxiety-ativan does not remedy"  ? Flagyl [Metronidazole] Anxiety  ? Singulair [Montelukast Sodium] Anxiety  ?: ? ? ?Family History  ?Problem Relation Age of Onset  ? Anxiety disorder Father   ? Anxiety disorder Son   ?  Anxiety disorder Daughter   ?: ? ? ?Social History  ? ?Socioeconomic History  ? Marital status: Married  ?  Spouse name: Horris Latino  ? Number of children: 3  ? Years of education: 77  ? Highest education level: Some college, no degree  ?Occupational History  ? Occupation: retired  ?Tobacco Use  ? Smoking status: Former  ?  Packs/day: 0.25  ?  Years: 2.00  ?  Pack years: 0.50  ?  Types: Cigarettes  ?  Quit date: 01/14/1969  ?  Years since quitting: 53.0  ? Smokeless tobacco: Former  ?  Types: Chew  ?  Quit date: 01/15/1999  ?Vaping Use  ? Vaping Use: Never used  ?Substance and Sexual Activity  ? Alcohol use: Not Currently  ? Drug use: No  ? Sexual activity: Not Currently  ?  Birth control/protection: None  ?Other Topics Concern  ? Not on file  ?Social History Narrative  ? Right handed  ? Lives with family   ? ?Social Determinants of Health  ? ?Financial Resource Strain: Not on file  ?Food  Insecurity: Not on file  ?Transportation Needs: Not on file  ?Physical Activity: Not on file  ?Stress: Not on file  ?Social Connections: Not on file  ?Intimate Partner Violence: Not on file  ?: ? ?Pertinent items are noted in HPI. ? ? ?

## 2022-02-03 ENCOUNTER — Encounter: Payer: Self-pay | Admitting: Genetic Counselor

## 2022-02-03 NOTE — Progress Notes (Signed)
REFERRING PROVIDER: ?Zola Button, MD ?[address] ?[place], [place] [address] ? ?PRIMARY PROVIDER:  ?Celene Squibb, MD ? ?PRIMARY REASON FOR VISIT:  ?1. Malignant neoplasm of prostate ([place])   ? ? ?HISTORY OF PRESENT ILLNESS:   ?Mr. Schriner, a 76 y.o. male, was seen for a [place] cancer genetics consultation at the request of Dr. Alen Blew due to a personal and family history of cancer.  Mr. Lindholm presents to clinic today to discuss the possibility of a hereditary predisposition to cancer, to discuss genetic testing, and to further clarify his future cancer risks, as well as potential cancer risks for family members.  ? ?In March 2023, at the age of 61, Mr. Schroepfer was diagnosed with high risk prostate cancer. ? ?CANCER HISTORY:  ?Oncology History  ?Malignant neoplasm of prostate ([place])  ?02/02/2022 Initial Diagnosis  ? Malignant neoplasm of prostate ([place]) ? ?  ?02/02/2022 Cancer Staging  ? Staging form: Prostate, AJCC 8th Edition ?- Clinical: Stage IIC (cT1c, cN0, cM0, PSA: 5.9, Grade Group: 4) - Signed by Wyatt Portela, MD on 02/02/2022 ?Prostate specific antigen (PSA) range: Less than 10 ?Gleason score: 8 ?Histologic grading system: 5 grade system ? ?  ? ? ?Past Medical History:  ?Diagnosis Date  ? Anxiety   ? Arthritis   ? Bladder stone   ? Depression   ? Diverticulosis of colon   ? GERD (gastroesophageal reflux disease)   ? H/O hiatal hernia   ? Helicobacter pylori gastritis   ? History of colonoscopy with polypectomy   ? TUBULAR ADENOMA-- 2012  ? History of esophageal dilatation   ? STRICTURE--  LAST DONE 03/2013  ? Hypertension   ? for 5 yrs  ? IDA (iron deficiency anemia) 11/22/2018  ? Left ureteral calculus   ? Osteoporosis   ? Wears glasses   ? ? ?Past Surgical History:  ?Procedure Laterality Date  ? BIOPSY  09/10/2016  ? Procedure: BIOPSY;  Surgeon: Rogene Houston, MD;  [facility];  Service: Endoscopy;;  rectal polyp  ? COLONOSCOPY WITH PROPOFOL N/A 09/10/2016  ? Procedure: COLONOSCOPY WITH  PROPOFOL;  Surgeon: Rogene Houston, MD;  [facility];  Service: Endoscopy;  Laterality: N/A;  ? COLONOSCOPY WITH PROPOFOL N/A 11/25/2021  ? Procedure: COLONOSCOPY WITH PROPOFOL;  Surgeon: Rogene Houston, MD;  [facility];  Service: Endoscopy;  Laterality: N/A;  1:15  ASA 1  ? CYSTO/  URETEROSCOPIC LITHOTRIPSY/ STONE EXTRACTION  1995  ? CYSTOSCOPY W/ RETROGRADES Right 01/15/2014  ? Procedure: CYSTOSCOPY WITH RIGHT RETROGRADE PYELOGRAM;  Surgeon: Irine Seal, MD;  Location: [facility];  Service: Urology;  Laterality: Right;  ? CYSTOSCOPY WITH RETROGRADE PYELOGRAM, URETEROSCOPY AND STENT PLACEMENT Left 01/15/2014  ? Procedure: CYSTOSCOPY , LEFT RETROGRADE PYELOGRAM, LEFT URETEROSCOPY, BASKET STONE EXTRACTION;  Surgeon: Irine Seal, MD;  Location: [facility];  Service: Urology;  Laterality: Left;  ? ESOPHAGEAL DILATION  03/21/2013  ? Procedure: ESOPHAGEAL DILATION;  Surgeon: Danie Binder, MD;  [facility];  Service: Endoscopy;;  ? ESOPHAGEAL DILATION N/A 09/10/2016  ? Procedure: ESOPHAGEAL DILATION;  Surgeon: Rogene Houston, MD;  [facility];  Service: Endoscopy;  Laterality: N/A;  ? ESOPHAGOGASTRODUODENOSCOPY N/A 03/21/2013  ? Procedure: ESOPHAGOGASTRODUODENOSCOPY (EGD);  Surgeon: Danie Binder, MD;  [facility];  Service: Endoscopy;  Laterality: N/A;  ? ESOPHAGOGASTRODUODENOSCOPY (EGD) WITH ESOPHAGEAL DILATION N/A 04/26/2013  ? Procedure: ESOPHAGOGASTRODUODENOSCOPY (EGD) WITH ESOPHAGEAL DILATION;  Surgeon:  Rogene Houston, MD;  [facility];  Service: Endoscopy;  Laterality: N/A;  130  ? ESOPHAGOGASTRODUODENOSCOPY (EGD) WITH PROPOFOL N/A 09/10/2016  ? Procedure: ESOPHAGOGASTRODUODENOSCOPY (EGD) WITH PROPOFOL;  Surgeon: Rogene Houston, MD;  [facility];  Service: Endoscopy;  Laterality: N/A;  10:20  ? EXTRACORPOREAL SHOCK WAVE LITHOTRIPSY  X2   YRS AGO  ? HOLMIUM LASER APPLICATION Left 2/72/5366  ?  Procedure: HOLMIUM LASER APPLICATION, LEFT URETER;  Surgeon: Irine Seal, MD;  Location[facility];  Service: Urology;  Laterality: Left;  ? POLYPECTOMY  11/25/2021  ? Procedure: POLYPECTOMY INTESTINAL;  Surgeon: Rogene Houston, MD;  [facility];  Service: Endoscopy;;  ? SHOULDER ARTHROSCOPY W/ SUBACROMIAL DECOMPRESSION AND DISTAL CLAVICLE EXCISION Left 2000  ? ? ?Social History  ? ?Socioeconomic History  ? Marital status: Married  ?  Spouse name: Horris Latino  ? Number of children: 3  ? Years of education: 42  ? Highest education level: Some college, no degree  ?Occupational History  ? Occupation: retired  ?Tobacco Use  ? Smoking status: Former  ?  Packs/day: 0.25  ?  Years: 2.00  ?  Pack years: 0.50  ?  Types: Cigarettes  ?  Quit date: 01/14/1969  ?  Years since quitting: 53.0  ? Smokeless tobacco: Former  ?  Types: Chew  ?  Quit date: 01/15/1999  ?Vaping Use  ? Vaping Use: Never used  ?Substance and Sexual Activity  ? Alcohol use: Not Currently  ? Drug use: No  ? Sexual activity: Not Currently  ?  Birth control/protection: None  ?Other Topics Concern  ? Not on file  ?Social History Narrative  ? Right handed  ? Lives with family   ? ?Social Determinants of Health  ? ?Financial Resource Strain: Not on file  ?Food Insecurity: Not on file  ?Transportation Needs: Not on file  ?Physical Activity: Not on file  ?Stress: Not on file  ?Social Connections: Not on file  ?  ? ?FAMILY HISTORY:  ?We obtained a detailed, 4-generation family history.  Significant diagnoses are listed below: ?Family History  ?Problem Relation Age of Onset  ? Anxiety disorder Father   ? Breast cancer Sister 32  ? Breast cancer Maternal Aunt   ? Breast cancer Maternal Aunt   ? Breast cancer Maternal Aunt   ? Breast cancer Paternal Grandmother   ? Anxiety disorder Daughter   ? Anxiety disorder Son   ? ? ? ? ? ?Mr. Berg's sister was diagnosed with breast cancer at age 95. All 3 of his maternal aunts died due to breast cancer. His  father was diagnosed with leukemia at age 29 and died at age 6. His paternal grandmother was diagnosed with breast cancer at an unknown age, she is deceased. Mr. Tabora is unaware of previous family history of genetic testing for hereditary cancer risks.  ? ?GENETIC COUNSELING ASSESSMENT: Mr. Groh is a 76 y.o. male with a personal and family history of cancer which is somewhat suggestive of a hereditary predisposition to cancer. We, therefore, discussed and recommended the following at today's visit.  ? ?DISCUSSION: We discussed that 5 - 10% of cancer is hereditary, with most cases of prostate cancer associated with BRCA1/2.  There are other genes that can be associated with hereditary prostate cancer syndromes.  We discussed that testing is beneficial for several reasons including knowing how to follow individuals after completing their treatment, identifying whether potential treatment options  would be beneficial,  and understanding if other family members could be at risk for cancer and allowing them to undergo genetic testing.  ? ?We reviewed the characteristics, features and inheritance patterns of hereditary cancer syndromes. We also discussed genetic testing, including the appropriate family members to test, the process of testing, insurance coverage and turn-around-time for results. We discussed the implications of a negative, positive, carrier and/or variant of uncertain significant result. We recommended Mr. Gargis pursue genetic testing for a panel that includes genes associated with prostate and breast cancer.  ? ?Mr. Wildermuth elected to have [employer]. The CancerNext gene panel offered by [employer] includes sequencing, rearrangement analysis, and RNA analysis for the following 36 genes:   APC, ATM, AXIN2, BARD1, BMPR1A, BRCA1, BRCA2, BRIP1, CDH1, CDK4, CDKN2A, CHEK2, DICER1, HOXB13, EPCAM, GREM1, MLH1, MSH2, MSH3, MSH6, MUTYH, NBN, NF1, NTHL1, PALB2, PMS2, POLD1, POLE, PTEN, RAD51C, RAD51D,  RECQL, SMAD4, SMARCA4, STK11, and TP53.  ? ?Based on Mr. Chaddock personal and family history of cancer, he meets medical criteria for genetic testing. Despite that he meets criteria, he may still have an out of pocke

## 2022-02-08 ENCOUNTER — Telehealth: Payer: Self-pay | Admitting: *Deleted

## 2022-02-08 NOTE — Telephone Encounter (Signed)
Called patient to inform of appt. for ADT on 02-12-22- arrival time- 3:15 pm @ Dr. Ralene Muskrat Office @ [facility], lvm for a return call ?

## 2022-02-12 ENCOUNTER — Telehealth: Payer: Self-pay | Admitting: *Deleted

## 2022-02-12 ENCOUNTER — Other Ambulatory Visit: Payer: Self-pay

## 2022-02-12 DIAGNOSIS — C61 Malignant neoplasm of prostate: Secondary | ICD-10-CM | POA: Diagnosis not present

## 2022-02-12 NOTE — Telephone Encounter (Signed)
CALLED PATIENT TO INFORM OF APPT. WITH DR, MORRIS ([place]) FOR [place] VISIT ON 02-18-22, SPOKE WITH PATIENT AND HE IS AWARE OF THIS APPT.

## 2022-02-12 NOTE — Progress Notes (Signed)
Patient presented to [facility] on 5/9 for stage T1c adenocarcinoma of the prostate with a Gleason's score of 4+4 and a PSA of 5.87.   Patient decided to proceed with LT-ADT and radiation.   Follow up with Dr. Jeffie Pollock on 5/19 and patient agreeable to start Orgovyx.  Patient requested to have radiation closer to his home in [place] with Dr. Lynnette Caffey.   RN successfully faxed radiation oncologist referral to [phone].  Patient aware.  Will continue to follow to ensure patient is set up for consult.

## 2022-02-16 ENCOUNTER — Other Ambulatory Visit: Payer: Self-pay | Admitting: Urology

## 2022-02-17 ENCOUNTER — Other Ambulatory Visit: Payer: Self-pay | Admitting: Urology

## 2022-02-17 MED ORDER — LEVOFLOXACIN IN D5W 750 MG/150ML IV SOLN
500.0000 mg | INTRAVENOUS | Status: AC
Start: 2022-02-17 — End: ?

## 2022-02-19 ENCOUNTER — Telehealth: Payer: Self-pay | Admitting: Genetic Counselor

## 2022-02-19 ENCOUNTER — Encounter: Payer: Self-pay | Admitting: Genetic Counselor

## 2022-02-19 DIAGNOSIS — Z1379 Encounter for other screening for genetic and chromosomal anomalies: Secondary | ICD-10-CM | POA: Insufficient documentation

## 2022-02-19 NOTE — Telephone Encounter (Signed)
I attempted to contact Mr. Cameron Atkins to discuss his genetic testing results (36 genes). I left a voicemail requesting he call me back at [phone].  Lucille Passy, MS, [facility] Genetic Counselor [email or web address] (P) [phone]

## 2022-02-23 DIAGNOSIS — D509 Iron deficiency anemia, unspecified: Secondary | ICD-10-CM | POA: Diagnosis not present

## 2022-02-23 DIAGNOSIS — Z79899 Other long term (current) drug therapy: Secondary | ICD-10-CM | POA: Diagnosis not present

## 2022-02-23 DIAGNOSIS — R7303 Prediabetes: Secondary | ICD-10-CM | POA: Diagnosis not present

## 2022-02-23 DIAGNOSIS — R972 Elevated prostate specific antigen [PSA]: Secondary | ICD-10-CM | POA: Diagnosis not present

## 2022-02-23 NOTE — Progress Notes (Signed)
RN called to confirm patient has a Radiation Oncology consult with Dr. Lynnette Caffey on 5/31 @ 12:45 pm.  Patient aware of appointment time, and date.  RN provided additional education on radiation treatment.   Will follow up after consult with Dr. Lynnette Caffey as patient voiced he was unsure if he wants to do treatment in [place] or [place].

## 2022-02-24 DIAGNOSIS — C61 Malignant neoplasm of prostate: Secondary | ICD-10-CM | POA: Diagnosis not present

## 2022-02-24 DIAGNOSIS — Z79818 Long term (current) use of other agents affecting estrogen receptors and estrogen levels: Secondary | ICD-10-CM | POA: Diagnosis not present

## 2022-02-26 NOTE — Progress Notes (Signed)
RN spoke with patient to follow up after consult with Dr. Lynnette Caffey.   RN answered all questions, and patient will proceed with radiation with Dr. Lynnette Caffey.

## 2022-03-01 ENCOUNTER — Telehealth: Payer: Self-pay | Admitting: Genetic Counselor

## 2022-03-01 NOTE — Telephone Encounter (Signed)
I contacted Mr. Signorelli to discuss his genetic testing results. No pathogenic variants were identified in the 36 genes analyzed. Detailed clinic note to follow.  The test report has been scanned into EPIC and is located under the Molecular Pathology section of the Results Review tab.  A portion of the result report is included below for reference.   Lucille Passy, MS, [facility] Genetic Counselor [email or web address] (P) [phone]

## 2022-03-01 NOTE — Telephone Encounter (Signed)
I attempted to contact Mr. Gettis to discuss his genetic testing results (36 genes). I left a voicemail requesting he call me back at [phone].  Lucille Passy, MS, [facility] Genetic Counselor [email or web address] (P) [phone]

## 2022-03-03 ENCOUNTER — Ambulatory Visit: Payer: Self-pay | Admitting: Genetic Counselor

## 2022-03-03 DIAGNOSIS — R7303 Prediabetes: Secondary | ICD-10-CM | POA: Diagnosis not present

## 2022-03-03 DIAGNOSIS — D509 Iron deficiency anemia, unspecified: Secondary | ICD-10-CM | POA: Diagnosis not present

## 2022-03-03 DIAGNOSIS — I700 Atherosclerosis of aorta: Secondary | ICD-10-CM | POA: Diagnosis not present

## 2022-03-03 DIAGNOSIS — Z1379 Encounter for other screening for genetic and chromosomal anomalies: Secondary | ICD-10-CM

## 2022-03-03 DIAGNOSIS — I10 Essential (primary) hypertension: Secondary | ICD-10-CM | POA: Diagnosis not present

## 2022-03-03 DIAGNOSIS — G2581 Restless legs syndrome: Secondary | ICD-10-CM | POA: Diagnosis not present

## 2022-03-03 DIAGNOSIS — G4700 Insomnia, unspecified: Secondary | ICD-10-CM | POA: Diagnosis not present

## 2022-03-03 DIAGNOSIS — K5730 Diverticulosis of large intestine without perforation or abscess without bleeding: Secondary | ICD-10-CM | POA: Diagnosis not present

## 2022-03-03 DIAGNOSIS — K219 Gastro-esophageal reflux disease without esophagitis: Secondary | ICD-10-CM | POA: Diagnosis not present

## 2022-03-03 DIAGNOSIS — C61 Malignant neoplasm of prostate: Secondary | ICD-10-CM | POA: Diagnosis not present

## 2022-03-03 DIAGNOSIS — F419 Anxiety disorder, unspecified: Secondary | ICD-10-CM | POA: Diagnosis not present

## 2022-03-03 DIAGNOSIS — R944 Abnormal results of kidney function studies: Secondary | ICD-10-CM | POA: Diagnosis not present

## 2022-03-03 NOTE — Progress Notes (Signed)
HPI:   Mr. Cameron Atkins was previously seen in the [place] clinic due to a personal and family history of cancer and concerns regarding a hereditary predisposition to cancer. Please refer to our prior cancer genetics clinic note for more information regarding our discussion, assessment and recommendations, at the time. Mr. Cameron Atkins recent genetic test results were disclosed to him, as were recommendations warranted by these results. These results and recommendations are discussed in more detail below.  CANCER HISTORY:  Oncology History  Malignant neoplasm of prostate ([place])  12/07/2021 Cancer Staging   Staging form: Prostate, AJCC 8th Edition - Clinical stage from 12/07/2021: Stage IIC (cT1c, cN0, cM0, PSA: 5.9, Grade Group: 4) - Signed by Freeman Caldron, PA-C on 02/05/2022 Histopathologic type: Adenocarcinoma, NOS Stage prefix: Initial diagnosis Prostate specific antigen (PSA) range: Less than 10 Gleason primary pattern: 4 Gleason secondary pattern: 4 Gleason score: 8 Histologic grading system: 5 grade system Number of biopsy cores examined: 12 Number of biopsy cores positive: 2 Location of positive needle core biopsies: Both sides    02/02/2022 Initial Diagnosis   Malignant neoplasm of prostate ([place])     Genetic Testing   [employer] CancerNext Panel was Negative. Report date is 02/18/2022.  The CancerNext gene panel offered by [employer] includes sequencing, rearrangement analysis, and RNA analysis for the following 36 genes:   APC, ATM, AXIN2, BARD1, BMPR1A, BRCA1, BRCA2, BRIP1, CDH1, CDK4, CDKN2A, CHEK2, DICER1, HOXB13, EPCAM, GREM1, MLH1, MSH2, MSH3, MSH6, MUTYH, NBN, NF1, NTHL1, PALB2, PMS2, POLD1, POLE, PTEN, RAD51C, RAD51D, RECQL, SMAD4, SMARCA4, STK11, and TP53.      FAMILY HISTORY:  We obtained a detailed, 4-generation family history.  Significant diagnoses are listed below: Family History  Problem Relation Age of Onset   Anxiety disorder Father    Breast cancer  Sister 78   Breast cancer Maternal Aunt    Breast cancer Maternal Aunt    Breast cancer Maternal Aunt    Breast cancer Paternal Grandmother    Anxiety disorder Daughter    Anxiety disorder Son        Mr. Cameron Atkins sister was diagnosed with breast cancer at age 51. All 3 of his maternal aunts died due to breast cancer. His father was diagnosed with leukemia at age 83 and died at age 21. His paternal grandmother was diagnosed with breast cancer at an unknown age, she is deceased. Mr. Cameron Atkins is unaware of previous family history of genetic testing for hereditary cancer risks.    GENETIC TEST RESULTS:  The [employer] CancerNext Panel found no pathogenic mutations.   The CancerNext gene panel offered by [employer] includes sequencing, rearrangement analysis, and RNA analysis for the following 36 genes:   APC, ATM, AXIN2, BARD1, BMPR1A, BRCA1, BRCA2, BRIP1, CDH1, CDK4, CDKN2A, CHEK2, DICER1, HOXB13, EPCAM, GREM1, MLH1, MSH2, MSH3, MSH6, MUTYH, NBN, NF1, NTHL1, PALB2, PMS2, POLD1, POLE, PTEN, RAD51C, RAD51D, RECQL, SMAD4, SMARCA4, STK11, and TP53.   The test report has been scanned into EPIC and is located under the Molecular Pathology section of the Results Review tab.  A portion of the result report is included below for reference. Genetic testing reported out on 02/18/2022.       Even though a pathogenic variant was not identified, possible explanations for the cancer in the family may include: There may be no hereditary risk for cancer in the family. The cancers in Mr. Cameron Atkins and/or his family may be due to other genetic or environmental factors. There may be a gene mutation in  one of these genes that current testing methods cannot detect, but that chance is small. There could be another gene that has not yet been discovered, or that we have not yet tested, that is responsible for the cancer diagnoses in the family.  It is also possible there is a hereditary cause for the cancer in the family that Mr.  Cameron Atkins did not inherit.  Therefore, it is important to remain in touch with cancer genetics in the future so that we can continue to offer Mr. Cameron Atkins the most up to date genetic testing.   ADDITIONAL GENETIC TESTING:  We discussed with Mr. Cameron Atkins that his genetic testing was fairly extensive.  If there are genes identified to increase cancer risk that can be analyzed in the future, we would be happy to discuss and coordinate this testing at that time.    CANCER SCREENING RECOMMENDATIONS:  Mr. Cameron Atkins test result is considered negative (normal).  This means that we have not identified a hereditary cause for his personal and family history of cancer at this time. Most cancers happen by chance and this negative test suggests that his cancer may fall into this category.    An individual's cancer risk and medical management are not determined by genetic test results alone. Overall cancer risk assessment incorporates additional factors, including personal medical history, family history, and any available genetic information that may result in a personalized plan for cancer prevention and surveillance. Therefore, it is recommended he continue to follow the cancer management and screening guidelines provided by his oncology and primary healthcare provider.  RECOMMENDATIONS FOR FAMILY MEMBERS:   Since he did not inherit a mutation in a cancer predisposition gene included on this panel, his children could not have inherited a mutation from him in one of these genes. Other members of the family may still carry a pathogenic variant in one of these genes that Mr. Cameron Atkins did not inherit. Based on the family history, we recommend his sister have genetic counseling and testing.   FOLLOW-UP:  Cancer genetics is a rapidly advancing field and it is possible that new genetic tests will be appropriate for him and/or his family members in the future. We encouraged him to remain in contact with cancer genetics on an annual basis  so we can update his personal and family histories and let him know of advances in cancer genetics that may benefit this family.   Our contact number was provided. Mr. Cameron Atkins questions were answered to his satisfaction, and he knows he is welcome to call us at anytime with additional questions or concerns.   Lucille Passy, MS, [facility] Genetic Counselor [email or web address] (P) [phone]

## 2022-03-04 ENCOUNTER — Encounter: Payer: Self-pay | Admitting: Genetic Counselor

## 2022-03-10 DIAGNOSIS — C61 Malignant neoplasm of prostate: Secondary | ICD-10-CM | POA: Diagnosis not present

## 2022-03-15 DIAGNOSIS — Z789 Other specified health status: Secondary | ICD-10-CM | POA: Diagnosis not present

## 2022-03-15 DIAGNOSIS — L814 Other melanin hyperpigmentation: Secondary | ICD-10-CM | POA: Diagnosis not present

## 2022-03-15 DIAGNOSIS — L820 Inflamed seborrheic keratosis: Secondary | ICD-10-CM | POA: Diagnosis not present

## 2022-03-15 DIAGNOSIS — L298 Other pruritus: Secondary | ICD-10-CM | POA: Diagnosis not present

## 2022-03-15 DIAGNOSIS — L538 Other specified erythematous conditions: Secondary | ICD-10-CM | POA: Diagnosis not present

## 2022-03-15 DIAGNOSIS — Z85828 Personal history of other malignant neoplasm of skin: Secondary | ICD-10-CM | POA: Diagnosis not present

## 2022-03-15 DIAGNOSIS — D1801 Hemangioma of skin and subcutaneous tissue: Secondary | ICD-10-CM | POA: Diagnosis not present

## 2022-03-15 DIAGNOSIS — Z08 Encounter for follow-up examination after completed treatment for malignant neoplasm: Secondary | ICD-10-CM | POA: Diagnosis not present

## 2022-03-15 DIAGNOSIS — L821 Other seborrheic keratosis: Secondary | ICD-10-CM | POA: Diagnosis not present

## 2022-03-16 DIAGNOSIS — C61 Malignant neoplasm of prostate: Secondary | ICD-10-CM | POA: Diagnosis not present

## 2022-03-16 DIAGNOSIS — M810 Age-related osteoporosis without current pathological fracture: Secondary | ICD-10-CM | POA: Diagnosis not present

## 2022-03-16 DIAGNOSIS — Z79818 Long term (current) use of other agents affecting estrogen receptors and estrogen levels: Secondary | ICD-10-CM | POA: Diagnosis not present

## 2022-03-17 DIAGNOSIS — N401 Enlarged prostate with lower urinary tract symptoms: Secondary | ICD-10-CM | POA: Diagnosis not present

## 2022-03-17 DIAGNOSIS — R351 Nocturia: Secondary | ICD-10-CM | POA: Diagnosis not present

## 2022-03-17 DIAGNOSIS — R972 Elevated prostate specific antigen [PSA]: Secondary | ICD-10-CM | POA: Diagnosis not present

## 2022-03-17 DIAGNOSIS — C61 Malignant neoplasm of prostate: Secondary | ICD-10-CM | POA: Diagnosis not present

## 2022-03-25 ENCOUNTER — Encounter (HOSPITAL_BASED_OUTPATIENT_CLINIC_OR_DEPARTMENT_OTHER): Payer: Self-pay | Admitting: Urology

## 2022-03-25 NOTE — Progress Notes (Signed)
Spoke w/ via phone for pre-op interview--- [place]----   ISTAT and EKG            Lab results------ COVID test -----patient states asymptomatic no test needed Arrive at -------0830 NPO after MN NO Solid Food.  Clear liquids from MN until---0730 Med rec completed Medications to take morning of surgery ----- Lorazapam, Protonix and Ropinrole Diabetic medication ----- Patient instructed no nail polish to be worn day of surgery Patient instructed to bring photo id and insurance card day of surgery Patient aware to have Driver (ride ) / caregiver [name]   for 24 hours after surgery  Patient Special Instructions ----- Pre-Op special Istructions ----- FLEETS enema AM of procedure. Patient verbalized understanding. Patient verbalized understanding of instructions that were given at this phone interview. Patient denies shortness of breath, chest pain, fever, cough at this phone interview.

## 2022-03-31 NOTE — Progress Notes (Addendum)
Spoke with patient by phone, patient aware new arrival time is 730 am, clear liquids from midnight until 630 am . take the following medications , fleets enema  hs night  before  surgery: gabapentin, ativan prn, protonix, roprinole.

## 2022-04-01 DIAGNOSIS — G2581 Restless legs syndrome: Secondary | ICD-10-CM | POA: Diagnosis not present

## 2022-04-01 DIAGNOSIS — F411 Generalized anxiety disorder: Secondary | ICD-10-CM | POA: Diagnosis not present

## 2022-04-01 DIAGNOSIS — C61 Malignant neoplasm of prostate: Secondary | ICD-10-CM | POA: Diagnosis not present

## 2022-04-01 DIAGNOSIS — M810 Age-related osteoporosis without current pathological fracture: Secondary | ICD-10-CM | POA: Diagnosis not present

## 2022-04-06 ENCOUNTER — Encounter (HOSPITAL_BASED_OUTPATIENT_CLINIC_OR_DEPARTMENT_OTHER): Payer: Self-pay | Admitting: Urology

## 2022-04-06 ENCOUNTER — Ambulatory Visit (HOSPITAL_BASED_OUTPATIENT_CLINIC_OR_DEPARTMENT_OTHER): Payer: Medicare Other | Admitting: Anesthesiology

## 2022-04-06 ENCOUNTER — Other Ambulatory Visit: Payer: Self-pay

## 2022-04-06 ENCOUNTER — Encounter (HOSPITAL_BASED_OUTPATIENT_CLINIC_OR_DEPARTMENT_OTHER)
Admission: RE | Disposition: A | Discharge status: Home / Self Care | Payer: Self-pay | Source: Ambulatory Visit | Attending: Urology

## 2022-04-06 ENCOUNTER — Ambulatory Visit (HOSPITAL_COMMUNITY)
Admission: RE | Admit: 2022-04-06 | Discharge: 2022-04-06 | Disposition: A | Discharge status: Home / Self Care | Payer: Medicare Other | Source: Ambulatory Visit | Attending: Urology | Admitting: Urology

## 2022-04-06 DIAGNOSIS — K219 Gastro-esophageal reflux disease without esophagitis: Secondary | ICD-10-CM | POA: Diagnosis not present

## 2022-04-06 DIAGNOSIS — I10 Essential (primary) hypertension: Secondary | ICD-10-CM

## 2022-04-06 DIAGNOSIS — C61 Malignant neoplasm of prostate: Secondary | ICD-10-CM | POA: Insufficient documentation

## 2022-04-06 DIAGNOSIS — Z87891 Personal history of nicotine dependence: Secondary | ICD-10-CM | POA: Insufficient documentation

## 2022-04-06 DIAGNOSIS — F418 Other specified anxiety disorders: Secondary | ICD-10-CM

## 2022-04-06 HISTORY — PX: GOLD SEED IMPLANT: SHX6343

## 2022-04-06 HISTORY — DX: Restless legs syndrome: G25.81

## 2022-04-06 HISTORY — PX: SPACE OAR INSTILLATION: SHX6769

## 2022-04-06 LAB — POCT I-STAT, CHEM 8
BUN: 19 mg/dL (ref 8–23)
Calcium, Ion: 1.31 mmol/L (ref 1.15–1.40)
Chloride: 104 mmol/L (ref 98–111)
Creatinine, Ser: 0.9 mg/dL (ref 0.61–1.24)
Glucose, Bld: 106 mg/dL — ABNORMAL HIGH (ref 70–99)
HCT: 29 % — ABNORMAL LOW (ref 39.0–52.0)
Hemoglobin: 9.9 g/dL — ABNORMAL LOW (ref 13.0–17.0)
Potassium: 3.9 mmol/L (ref 3.5–5.1)
Sodium: 139 mmol/L (ref 135–145)
TCO2: 24 mmol/L (ref 22–32)

## 2022-04-06 SURGERY — INSERTION, GOLD SEEDS
Anesthesia: Monitor Anesthesia Care | Site: Prostate

## 2022-04-06 MED ORDER — ACETAMINOPHEN 500 MG PO TABS
ORAL_TABLET | ORAL | Status: AC
  Filled 2022-04-06: qty 2

## 2022-04-06 MED ORDER — FENTANYL CITRATE (PF) 100 MCG/2ML IJ SOLN
INTRAMUSCULAR | Status: AC
  Filled 2022-04-06: qty 2

## 2022-04-06 MED ORDER — FLEET ENEMA 7-19 GM/118ML RE ENEM
1.0000 | ENEMA | Freq: Once | RECTAL | Status: DC
Start: 2022-04-06 — End: 2022-04-06

## 2022-04-06 MED ORDER — FENTANYL CITRATE (PF) 100 MCG/2ML IJ SOLN
INTRAMUSCULAR | Status: DC | PRN
  Administered 2022-04-06: 50 ug via INTRAVENOUS

## 2022-04-06 MED ORDER — PROPOFOL 10 MG/ML IV BOLUS
INTRAVENOUS | Status: AC
  Filled 2022-04-06: qty 20

## 2022-04-06 MED ORDER — MIDAZOLAM HCL 2 MG/2ML IJ SOLN
INTRAMUSCULAR | Status: AC
  Filled 2022-04-06: qty 2

## 2022-04-06 MED ORDER — ONDANSETRON HCL 4 MG/2ML IJ SOLN
INTRAMUSCULAR | Status: AC
  Filled 2022-04-06: qty 2

## 2022-04-06 MED ORDER — ONDANSETRON HCL 4 MG/2ML IJ SOLN
INTRAMUSCULAR | Status: DC | PRN
  Administered 2022-04-06: 4 mg via INTRAVENOUS

## 2022-04-06 MED ORDER — FENTANYL CITRATE (PF) 100 MCG/2ML IJ SOLN: 25.0000 ug | INTRAMUSCULAR | Status: DC | PRN

## 2022-04-06 MED ORDER — CEFAZOLIN SODIUM-DEXTROSE 2-3 GM-%(50ML) IV SOLR
INTRAVENOUS | Status: DC | PRN
  Administered 2022-04-06: 2 g via INTRAVENOUS

## 2022-04-06 MED ORDER — PROPOFOL 500 MG/50ML IV EMUL
INTRAVENOUS | Status: AC
  Filled 2022-04-06: qty 100

## 2022-04-06 MED ORDER — LIDOCAINE HCL 2 % IJ SOLN
INTRAMUSCULAR | Status: DC | PRN
  Administered 2022-04-06: 7 mL

## 2022-04-06 MED ORDER — LACTATED RINGERS IV SOLN: INTRAVENOUS | Status: DC

## 2022-04-06 MED ORDER — PROPOFOL 500 MG/50ML IV EMUL
INTRAVENOUS | Status: DC | PRN
  Administered 2022-04-06: 200 ug/kg/min via INTRAVENOUS

## 2022-04-06 MED ORDER — PROPOFOL 10 MG/ML IV BOLUS
INTRAVENOUS | Status: DC | PRN
  Administered 2022-04-06: 20 mg via INTRAVENOUS

## 2022-04-06 MED ORDER — ACETAMINOPHEN 500 MG PO TABS
1000.0000 mg | ORAL_TABLET | Freq: Once | ORAL | Status: AC
  Administered 2022-04-06: 1000 mg via ORAL

## 2022-04-06 MED ORDER — SODIUM CHLORIDE (PF) 0.9 % IJ SOLN
INTRAMUSCULAR | Status: DC | PRN
  Administered 2022-04-06: 10 mL

## 2022-04-06 MED ORDER — CEFAZOLIN SODIUM-DEXTROSE 2-4 GM/100ML-% IV SOLN
INTRAVENOUS | Status: AC
  Filled 2022-04-06: qty 100

## 2022-04-06 MED ORDER — ONDANSETRON HCL 4 MG/2ML IJ SOLN
4.0000 mg | Freq: Once | INTRAMUSCULAR | Status: DC | PRN
Start: 2022-04-06 — End: 2022-04-06

## 2022-04-06 MED ORDER — MIDAZOLAM HCL 5 MG/5ML IJ SOLN
INTRAMUSCULAR | Status: DC | PRN
  Administered 2022-04-06: 2 mg via INTRAVENOUS

## 2022-04-06 SURGICAL SUPPLY — 26 items
BLADE CLIPPER SENSICLIP SURGIC (BLADE) ×2 IMPLANT
CNTNR URN SCR LID CUP LEK RST (MISCELLANEOUS) ×1 IMPLANT
CONT SPEC 4OZ STRL OR WHT (MISCELLANEOUS) ×2
COVER BACK TABLE 60X90IN (DRAPES) ×2 IMPLANT
DRSG TEGADERM 4X4.75 (GAUZE/BANDAGES/DRESSINGS) ×2 IMPLANT
DRSG TEGADERM 8X12 (GAUZE/BANDAGES/DRESSINGS) ×2 IMPLANT
GAUZE SPONGE 4X4 12PLY STRL (GAUZE/BANDAGES/DRESSINGS) ×2 IMPLANT
GLOVE BIO SURGEON STRL SZ7.5 (GLOVE) ×2 IMPLANT
GLOVE SURG ORTHO 8.5 STRL (GLOVE) ×2 IMPLANT
IMPL SPACEOAR VUE SYSTEM (Spacer) ×1 IMPLANT
IMPLANT SPACEOAR VUE SYSTEM (Spacer) ×2 IMPLANT
KIT TURNOVER CYSTO (KITS) ×2 IMPLANT
MARKER GOLD PRELOAD 1.2X3 (Urological Implant) ×1 IMPLANT
MARKER SKIN DUAL TIP RULER LAB (MISCELLANEOUS) ×2 IMPLANT
NDL SPNL 18GX3.5 QUINCKE PK (NEEDLE) IMPLANT
NDL SPNL 22GX3.5 QUINCKE BK (NEEDLE) ×1 IMPLANT
NEEDLE SPNL 18GX3.5 QUINCKE PK (NEEDLE) ×2 IMPLANT
NEEDLE SPNL 22GX3.5 QUINCKE BK (NEEDLE) ×4 IMPLANT
SEED GOLD PRELOAD 1.2X3 (Urological Implant) ×2 IMPLANT
SHEATH ULTRASOUND LF (SHEATH) ×2 IMPLANT
SHEATH ULTRASOUND LTX NONSTRL (SHEATH) IMPLANT
SURGILUBE 2OZ TUBE FLIPTOP (MISCELLANEOUS) ×2 IMPLANT
SYR 10ML LL (SYRINGE) IMPLANT
SYR CONTROL 10ML LL (SYRINGE) ×2 IMPLANT
TOWEL OR 17X26 10 PK STRL BLUE (TOWEL DISPOSABLE) ×2 IMPLANT
UNDERPAD 30X36 HEAVY ABSORB (UNDERPADS AND DIAPERS) ×2 IMPLANT

## 2022-04-06 NOTE — Op Note (Signed)
Operative Note   Preoperative diagnosis:  1.  Grade 4 prostate cancer    Postoperative diagnosis: 1.  Same    Procedure(s): 1.  Transrectal ultrasound guided prostatic gold seed fiducial marker and Space OAR placement   Surgeon: Ellison Hughs, MD   Assistants:  None    Anesthesia:  MAC   Complications:  None   EBL:  <5 mL   Specimens: 1. None   Drains/Catheters: 1.  None   Intraoperative findings:   Fiducial markers were placed at the prostatic base, mid-gland and apex.  SpaceOAR was placed with good separation of the prostate and rectum.    Indication: Cameron Atkins is a 76 y.o. male followed by Dr. Jeffie Pollock with grade 4 prostate cancer.  He is here today for gold seed fiducial marker and SpaceOAR placement leading up to external beam radiation as primary treatment of his prostate cancer.  He has been consented for the above procedures, voices understanding and wishes to proceed.   Description of procedure:   After informed consent the patient was brought to the major OR, placed on the table and administered general anesthesia. He was then moved to the modified lithotomy position with his perineum perpendicular to the floor. His perineum and genitalia were then sterilely prepped. An official timeout was then performed.    Real time transrectal ultrasonography was used visualize the prostate.  Gold seed fiducial markers were then placed transperineally at the prostatic base, mid gland and apex.   I then proceeded with placement of SpaceOAR by introducing a needle with the bevel angled inferiorly approximately 2 cm superior to the anus. This was angled downward and under direct ultrasound was placed within the space between the prostatic capsule and rectum. This was confirmed with a small amount of sterile saline injected and this was performed under direct ultrasound. I then attached the SpaceOAR to the needle and injected this in the space between the prostate and rectum with good  placement noted.  The patient tolerated the procedure well and was transferred to the postanesthesia in stable condition.   Plan: Follow-up in 4 months with PSA

## 2022-04-06 NOTE — H&P (Signed)
Urology Preoperative H&P   Chief Complaint: Prostate cancer   History of Present Illness: Cameron Atkins is a 76 y.o. male followed by Dr. Jeffie Pollock with grade 4 prostate cancer.  He is currently receiving ADT via Eligard.  He has elected to proceed with external beam radiation therapy as primary treatment of his prostate cancer.  He is here today for gold seed fiducial marker and SpaceOAR placement.  Leading up to surgery, the patient reports that he is voiding without difficulty and denies interval UTIs, dysuria or hematuria.   Past Medical History:  Diagnosis Date   Anxiety    Arthritis    Bladder stone    Depression    Diverticulosis of colon    GERD (gastroesophageal reflux disease)    H/O hiatal hernia    Helicobacter pylori gastritis    History of colonoscopy with polypectomy    TUBULAR ADENOMA-- 2012   History of esophageal dilatation    STRICTURE--  LAST DONE 03/2013   Hypertension    for 5 yrs   IDA (iron deficiency anemia) 11/22/2018   Left ureteral calculus    Osteoporosis    Restless legs syndrome    Wears glasses     Past Surgical History:  Procedure Laterality Date   BIOPSY  09/10/2016   Procedure: BIOPSY;  Surgeon: Rogene Houston, MD;  [facility];  Service: Endoscopy;;  rectal polyp   COLONOSCOPY WITH PROPOFOL N/A 09/10/2016   Procedure: COLONOSCOPY WITH PROPOFOL;  Surgeon: Rogene Houston, MD;  [facility]: [facility];  Service: Endoscopy;  Laterality: N/A;   COLONOSCOPY WITH PROPOFOL N/A 11/25/2021   Procedure: COLONOSCOPY WITH PROPOFOL;  Surgeon: Rogene Houston, MD;  [facility]: [facility];  Service: Endoscopy;  Laterality: N/A;  1:15  ASA 1   CYSTO/  URETEROSCOPIC LITHOTRIPSY/ STONE EXTRACTION  1995   CYSTOSCOPY W/ RETROGRADES Right 01/15/2014   Procedure: CYSTOSCOPY WITH RIGHT RETROGRADE PYELOGRAM;  Surgeon: Irine Seal, MD;  Location: [facility];  Service: Urology;  Laterality: Right;   CYSTOSCOPY WITH RETROGRADE PYELOGRAM,  URETEROSCOPY AND STENT PLACEMENT Left 01/15/2014   Procedure: CYSTOSCOPY , LEFT RETROGRADE PYELOGRAM, LEFT URETEROSCOPY, BASKET STONE EXTRACTION;  Surgeon: Irine Seal, MD;  Location: [facility];  Service: Urology;  Laterality: Left;   ESOPHAGEAL DILATION  03/21/2013   Procedure: ESOPHAGEAL DILATION;  Surgeon: Danie Binder, MD;  [facility];  Service: Endoscopy;;   ESOPHAGEAL DILATION N/A 09/10/2016   Procedure: ESOPHAGEAL DILATION;  Surgeon: Rogene Houston, MD;  [facility];  Service: Endoscopy;  Laterality: N/A;   ESOPHAGOGASTRODUODENOSCOPY N/A 03/21/2013   Procedure: ESOPHAGOGASTRODUODENOSCOPY (EGD);  Surgeon: Danie Binder, MD;  [facility];  Service: Endoscopy;  Laterality: N/A;   ESOPHAGOGASTRODUODENOSCOPY (EGD) WITH ESOPHAGEAL DILATION N/A 04/26/2013   Procedure: ESOPHAGOGASTRODUODENOSCOPY (EGD) WITH ESOPHAGEAL DILATION;  Surgeon: Rogene Houston, MD;  [facility];  Service: Endoscopy;  Laterality: N/A;  130   ESOPHAGOGASTRODUODENOSCOPY (EGD) WITH PROPOFOL N/A 09/10/2016   Procedure: ESOPHAGOGASTRODUODENOSCOPY (EGD) WITH PROPOFOL;  Surgeon: Rogene Houston, MD;  [facility];  Service: Endoscopy;  Laterality: N/A;  10:20   EXTRACORPOREAL SHOCK WAVE LITHOTRIPSY  X2   YRS AGO   HOLMIUM LASER APPLICATION Left 12/23/9240   Procedure: HOLMIUM LASER APPLICATION, LEFT URETER;  Surgeon: Irine Seal, MD;  Location: [facility];  Service: Urology;  Laterality: Left;   POLYPECTOMY  11/25/2021   Procedure: POLYPECTOMY INTESTINAL;  Surgeon: Hildred Laser  U, MD;  [facility];  Service: Endoscopy;;   SHOULDER ARTHROSCOPY W/ SUBACROMIAL DECOMPRESSION AND DISTAL CLAVICLE EXCISION Left 2000    Allergies:  Allergies  Allergen Reactions   Benadryl [Diphenhydramine] Other (See Comments)    anxious   Hctz [Hydrochlorothiazide]     hyponatremia   Mepergan [Meperidine-Promethazine] Nausea And Vomiting   Other      Antidepressants--per patient he cannot take.   Oxycodone Itching    hyperactivity   Amoxicillin     Has patient had a PCN reaction causing immediate rash, facial/tongue/throat swelling, SOB or lightheadedness with hypotension:No Has patient had a PCN reaction causing severe rash involving mucus membranes or skin necrosis:No Has patient had a PCN reaction that required hospitalization:No Has patient had a PCN reaction occurring within the last 10 years:Yes If all of the above answers are "NO", then may proceed with Cephalosporin use. "severe anxiety-ativan does not remedy"   Flagyl [Metronidazole] Anxiety   Singulair [Montelukast Sodium] Anxiety    Family History  Problem Relation Age of Onset   Anxiety disorder Father    Breast cancer Sister 4   Breast cancer Maternal Aunt    Breast cancer Maternal Aunt    Breast cancer Maternal Aunt    Breast cancer Paternal Grandmother    Anxiety disorder Daughter    Anxiety disorder Son     Social History:  reports that he quit smoking about 53 years ago. His smoking use included cigarettes. He has a 0.50 pack-year smoking history. He quit smokeless tobacco use about 23 years ago.  His smokeless tobacco use included chew. He reports that he does not currently use alcohol. He reports that he does not use drugs.  ROS: A complete review of systems was performed.  All systems are negative except for pertinent findings as noted.  Physical Exam:  Vital signs in last 24 hours: Temp:  [98 F (36.7 C)] 98 F (36.7 C) (07/11 0738) Pulse Rate:  [70] 70 (07/11 0738) Resp:  [16] 16 (07/11 0738) BP: (157)/(84) 157/84 (07/11 0738) SpO2:  [98 %] 98 % (07/11 0738) Weight:  [74.8 kg] 74.8 kg (07/11 0738) Constitutional:  Alert and oriented, No acute distress Cardiovascular: Regular rate and rhythm, No JVD Respiratory: Normal respiratory effort, Lungs clear bilaterally GI: Abdomen is soft, nontender, nondistended, no abdominal masses GU: No CVA  tenderness Lymphatic: No lymphadenopathy Neurologic: Grossly intact, no focal deficits Psychiatric: Normal mood and affect  Laboratory Data:  No results for input(s): "WBC", "HGB", "HCT", "PLT" in the last 72 hours.  No results for input(s): "NA", "K", "CL", "GLUCOSE", "BUN", "CALCIUM", "CREATININE" in the last 72 hours.  Invalid input(s): "CO3"   No results found for this or any previous visit (from the past 24 hour(s)). No results found for this or any previous visit (from the past 240 hour(s)).  Renal Function: No results for input(s): "CREATININE" in the last 168 hours. CrCl cannot be calculated (Patient's most recent lab result is older than the maximum 21 days allowed.).  Radiologic Imaging: No results found.  I independently reviewed the above imaging studies.  Assessment and Plan Cameron Atkins is a 76 y.o. male with grade 4 prostate cancer being treated with ADT and upcoming XRT  The risk, benefits and alternatives of gold seed fiducial marker and SpaceOAR placement was discussed with the patient.  Risk include, but are not limited to, bleeding, urinary tract infection, perineal infection, urethral injury, rectal irritation/ulceration, MI, CVA, DVT and the inherent risk of general  anesthesia.  He voices understanding and wishes to proceed.   Ellison Hughs, MD 04/06/2022, 7:57 AM  [facility] Pager: [phone]

## 2022-04-06 NOTE — Anesthesia Preprocedure Evaluation (Signed)
Anesthesia Evaluation  Patient identified by MRN, date of birth, ID band Patient awake    Reviewed: Allergy & Precautions, NPO status , Patient's Chart, lab work & pertinent test results  Airway Mallampati: II  TM Distance: >3 FB Neck ROM: Full    Dental  (+) Teeth Intact, Dental Advisory Given, Caps   Pulmonary former smoker,    Pulmonary exam normal breath sounds clear to auscultation       Cardiovascular hypertension, Pt. on medications Normal cardiovascular exam Rhythm:Regular Rate:Normal     Neuro/Psych PSYCHIATRIC DISORDERS Anxiety Depression negative neurological ROS     GI/Hepatic Neg liver ROS, GERD  Medicated and Controlled,  Endo/Other  negative endocrine ROS  Renal/GU negative Renal ROS   Prostate cancer     Musculoskeletal  (+) Arthritis ,   Abdominal   Peds  Hematology negative hematology ROS (+)   Anesthesia Other Findings Day of surgery medications reviewed with the patient.  Reproductive/Obstetrics                             Anesthesia Physical Anesthesia Plan  ASA: 2  Anesthesia Plan: MAC   Post-op Pain Management: Tylenol PO (pre-op)*   Induction: Intravenous  PONV Risk Score and Plan: 1 and Treatment may vary due to age or medical condition and TIVA  Airway Management Planned: Natural Airway and Nasal Cannula  Additional Equipment:   Intra-op Plan:   Post-operative Plan:   Informed Consent: I have reviewed the patients History and Physical, chart, labs and discussed the procedure including the risks, benefits and alternatives for the proposed anesthesia with the patient or authorized representative who has indicated his/her understanding and acceptance.     Dental advisory given  Plan Discussed with: CRNA  Anesthesia Plan Comments:         Anesthesia Quick Evaluation

## 2022-04-06 NOTE — Discharge Instructions (Addendum)
  Post Anesthesia Home Care Instructions  Activity: Get plenty of rest for the remainder of the day. A responsible individual must stay with you for 24 hours following the procedure.  For the next 24 hours, DO NOT: -Drive a car -Paediatric nurse -Drink alcoholic beverages -Take any medication unless instructed by your physician -Make any legal decisions or sign important papers.  Meals: Start with liquid foods such as gelatin or soup. Progress to regular foods as tolerated. Avoid greasy, spicy, heavy foods. If nausea and/or vomiting occur, drink only clear liquids until the nausea and/or vomiting subsides. Call your physician if vomiting continues.  Special Instructions/Symptoms: Your throat may feel dry or sore from the anesthesia or the breathing tube placed in your throat during surgery. If this causes discomfort, gargle with warm salt water. The discomfort should disappear within 24 hours.     No acetaminophen/Tylenol until after 2 pm today if needed.

## 2022-04-06 NOTE — Transfer of Care (Signed)
Immediate Anesthesia Transfer of Care Note  Patient: [name]  Procedure(s) Performed: GOLD SEED IMPLANT (Prostate) SPACE OAR INSTILLATION (Prostate)  Patient Location: PACU  Anesthesia Type:MAC  Level of Consciousness: awake, alert , oriented and patient cooperative  Airway & Oxygen Therapy: Patient Spontanous Breathing  Post-op Assessment: Report given to RN and Post -op Vital signs reviewed and stable  Post vital signs: Reviewed and stable  Last Vitals:  Vitals Value Taken Time  BP 105/71 04/06/22 1100  Temp    Pulse 59 04/06/22 1102  Resp 9 04/06/22 1102  SpO2 95 % 04/06/22 1102  Vitals shown include unvalidated device data.  Last Pain:  Vitals:   04/06/22 0738  TempSrc: Oral         Complications: No notable events documented.

## 2022-04-06 NOTE — Anesthesia Postprocedure Evaluation (Signed)
Anesthesia Post Note  Patient: Clenton Pare Jokerst  Procedure(s) Performed: GOLD SEED IMPLANT (Prostate) SPACE OAR INSTILLATION (Prostate)     Patient location during evaluation: PACU Anesthesia Type: MAC Level of consciousness: awake and alert Pain management: pain level controlled Vital Signs Assessment: post-procedure vital signs reviewed and stable Respiratory status: spontaneous breathing, nonlabored ventilation, respiratory function stable and patient connected to nasal cannula oxygen Cardiovascular status: stable and blood pressure returned to baseline Postop Assessment: no apparent nausea or vomiting Anesthetic complications: no   No notable events documented.  Last Vitals:  Vitals:   04/06/22 1130 04/06/22 1200  BP: 121/72 (!) 157/77  Pulse: (!) 52 (!) 57  Resp: (!) 9 15  Temp: 36.4 C (!) 36.3 C  SpO2: 96% 99%    Last Pain:  Vitals:   04/06/22 1200  TempSrc:   PainSc: 0-No pain                 Santa Lighter

## 2022-04-07 ENCOUNTER — Encounter (HOSPITAL_BASED_OUTPATIENT_CLINIC_OR_DEPARTMENT_OTHER): Payer: Self-pay | Admitting: Urology

## 2022-04-13 DIAGNOSIS — Z79818 Long term (current) use of other agents affecting estrogen receptors and estrogen levels: Secondary | ICD-10-CM | POA: Diagnosis not present

## 2022-04-13 DIAGNOSIS — C61 Malignant neoplasm of prostate: Secondary | ICD-10-CM | POA: Diagnosis not present

## 2022-04-20 DIAGNOSIS — M810 Age-related osteoporosis without current pathological fracture: Secondary | ICD-10-CM | POA: Diagnosis not present

## 2022-04-20 DIAGNOSIS — E559 Vitamin D deficiency, unspecified: Secondary | ICD-10-CM | POA: Diagnosis not present

## 2022-04-20 DIAGNOSIS — R5383 Other fatigue: Secondary | ICD-10-CM | POA: Diagnosis not present

## 2022-04-21 DIAGNOSIS — Z79818 Long term (current) use of other agents affecting estrogen receptors and estrogen levels: Secondary | ICD-10-CM | POA: Diagnosis not present

## 2022-04-21 DIAGNOSIS — C61 Malignant neoplasm of prostate: Secondary | ICD-10-CM | POA: Diagnosis not present

## 2022-04-22 DIAGNOSIS — Z79818 Long term (current) use of other agents affecting estrogen receptors and estrogen levels: Secondary | ICD-10-CM | POA: Diagnosis not present

## 2022-04-22 DIAGNOSIS — C61 Malignant neoplasm of prostate: Secondary | ICD-10-CM | POA: Diagnosis not present

## 2022-04-23 DIAGNOSIS — G2581 Restless legs syndrome: Secondary | ICD-10-CM | POA: Diagnosis not present

## 2022-04-23 DIAGNOSIS — C61 Malignant neoplasm of prostate: Secondary | ICD-10-CM | POA: Diagnosis not present

## 2022-04-23 DIAGNOSIS — M810 Age-related osteoporosis without current pathological fracture: Secondary | ICD-10-CM | POA: Diagnosis not present

## 2022-04-26 DIAGNOSIS — C61 Malignant neoplasm of prostate: Secondary | ICD-10-CM | POA: Diagnosis not present

## 2022-04-26 DIAGNOSIS — Z79818 Long term (current) use of other agents affecting estrogen receptors and estrogen levels: Secondary | ICD-10-CM | POA: Diagnosis not present

## 2022-04-27 DIAGNOSIS — C61 Malignant neoplasm of prostate: Secondary | ICD-10-CM | POA: Diagnosis not present

## 2022-04-28 DIAGNOSIS — C61 Malignant neoplasm of prostate: Secondary | ICD-10-CM | POA: Diagnosis not present

## 2022-04-29 DIAGNOSIS — C61 Malignant neoplasm of prostate: Secondary | ICD-10-CM | POA: Diagnosis not present

## 2022-04-29 DIAGNOSIS — M79672 Pain in left foot: Secondary | ICD-10-CM | POA: Diagnosis not present

## 2022-04-29 DIAGNOSIS — M79671 Pain in right foot: Secondary | ICD-10-CM | POA: Diagnosis not present

## 2022-04-29 DIAGNOSIS — M79675 Pain in left toe(s): Secondary | ICD-10-CM | POA: Diagnosis not present

## 2022-04-29 DIAGNOSIS — I739 Peripheral vascular disease, unspecified: Secondary | ICD-10-CM | POA: Diagnosis not present

## 2022-04-29 DIAGNOSIS — M79674 Pain in right toe(s): Secondary | ICD-10-CM | POA: Diagnosis not present

## 2022-04-30 DIAGNOSIS — C61 Malignant neoplasm of prostate: Secondary | ICD-10-CM | POA: Diagnosis not present

## 2022-05-03 DIAGNOSIS — C61 Malignant neoplasm of prostate: Secondary | ICD-10-CM | POA: Diagnosis not present

## 2022-05-04 DIAGNOSIS — C61 Malignant neoplasm of prostate: Secondary | ICD-10-CM | POA: Diagnosis not present

## 2022-05-05 DIAGNOSIS — C61 Malignant neoplasm of prostate: Secondary | ICD-10-CM | POA: Diagnosis not present

## 2022-05-06 DIAGNOSIS — F411 Generalized anxiety disorder: Secondary | ICD-10-CM | POA: Diagnosis not present

## 2022-05-06 DIAGNOSIS — G2581 Restless legs syndrome: Secondary | ICD-10-CM | POA: Diagnosis not present

## 2022-05-06 DIAGNOSIS — R0600 Dyspnea, unspecified: Secondary | ICD-10-CM | POA: Diagnosis not present

## 2022-05-06 DIAGNOSIS — C61 Malignant neoplasm of prostate: Secondary | ICD-10-CM | POA: Diagnosis not present

## 2022-05-06 NOTE — Progress Notes (Signed)
Pt is now under the care of Dr. Lynnette Caffey for his prostate radiation treatment.    Pt will continue to follow Dr. Jeffie Pollock @ [facility], and has his next appointment on 07/23/2022.   No navigation needs at this time.

## 2022-05-07 DIAGNOSIS — C61 Malignant neoplasm of prostate: Secondary | ICD-10-CM | POA: Diagnosis not present

## 2022-05-10 DIAGNOSIS — M810 Age-related osteoporosis without current pathological fracture: Secondary | ICD-10-CM | POA: Diagnosis not present

## 2022-05-10 DIAGNOSIS — C61 Malignant neoplasm of prostate: Secondary | ICD-10-CM | POA: Diagnosis not present

## 2022-05-10 DIAGNOSIS — E559 Vitamin D deficiency, unspecified: Secondary | ICD-10-CM | POA: Diagnosis not present

## 2022-05-11 DIAGNOSIS — C61 Malignant neoplasm of prostate: Secondary | ICD-10-CM | POA: Diagnosis not present

## 2022-05-12 DIAGNOSIS — C61 Malignant neoplasm of prostate: Secondary | ICD-10-CM | POA: Diagnosis not present

## 2022-05-13 DIAGNOSIS — C61 Malignant neoplasm of prostate: Secondary | ICD-10-CM | POA: Diagnosis not present

## 2022-05-14 DIAGNOSIS — C61 Malignant neoplasm of prostate: Secondary | ICD-10-CM | POA: Diagnosis not present

## 2022-05-17 DIAGNOSIS — C61 Malignant neoplasm of prostate: Secondary | ICD-10-CM | POA: Diagnosis not present

## 2022-05-18 DIAGNOSIS — F411 Generalized anxiety disorder: Secondary | ICD-10-CM | POA: Diagnosis not present

## 2022-05-18 DIAGNOSIS — C61 Malignant neoplasm of prostate: Secondary | ICD-10-CM | POA: Diagnosis not present

## 2022-05-18 DIAGNOSIS — T148XXA Other injury of unspecified body region, initial encounter: Secondary | ICD-10-CM | POA: Diagnosis not present

## 2022-05-19 DIAGNOSIS — C61 Malignant neoplasm of prostate: Secondary | ICD-10-CM | POA: Diagnosis not present

## 2022-05-20 DIAGNOSIS — C61 Malignant neoplasm of prostate: Secondary | ICD-10-CM | POA: Diagnosis not present

## 2022-05-21 DIAGNOSIS — C61 Malignant neoplasm of prostate: Secondary | ICD-10-CM | POA: Diagnosis not present

## 2022-05-24 DIAGNOSIS — C61 Malignant neoplasm of prostate: Secondary | ICD-10-CM | POA: Diagnosis not present

## 2022-05-25 DIAGNOSIS — C61 Malignant neoplasm of prostate: Secondary | ICD-10-CM | POA: Diagnosis not present

## 2022-05-26 DIAGNOSIS — C61 Malignant neoplasm of prostate: Secondary | ICD-10-CM | POA: Diagnosis not present

## 2022-05-27 DIAGNOSIS — C61 Malignant neoplasm of prostate: Secondary | ICD-10-CM | POA: Diagnosis not present

## 2022-05-28 ENCOUNTER — Other Ambulatory Visit (HOSPITAL_COMMUNITY): Payer: Self-pay | Admitting: Radiology

## 2022-05-28 DIAGNOSIS — C61 Malignant neoplasm of prostate: Secondary | ICD-10-CM | POA: Diagnosis not present

## 2022-05-28 DIAGNOSIS — R0600 Dyspnea, unspecified: Secondary | ICD-10-CM

## 2022-06-01 DIAGNOSIS — C61 Malignant neoplasm of prostate: Secondary | ICD-10-CM | POA: Diagnosis not present

## 2022-06-02 DIAGNOSIS — C61 Malignant neoplasm of prostate: Secondary | ICD-10-CM | POA: Diagnosis not present

## 2022-06-03 DIAGNOSIS — C61 Malignant neoplasm of prostate: Secondary | ICD-10-CM | POA: Diagnosis not present

## 2022-06-23 ENCOUNTER — Other Ambulatory Visit (HOSPITAL_COMMUNITY): Payer: Self-pay | Admitting: Radiology

## 2022-06-23 DIAGNOSIS — R0600 Dyspnea, unspecified: Secondary | ICD-10-CM

## 2022-07-14 DIAGNOSIS — C61 Malignant neoplasm of prostate: Secondary | ICD-10-CM | POA: Diagnosis not present

## 2022-07-14 DIAGNOSIS — Z79890 Hormone replacement therapy (postmenopausal): Secondary | ICD-10-CM | POA: Diagnosis not present

## 2022-07-14 DIAGNOSIS — M810 Age-related osteoporosis without current pathological fracture: Secondary | ICD-10-CM | POA: Diagnosis not present

## 2022-07-14 DIAGNOSIS — Z923 Personal history of irradiation: Secondary | ICD-10-CM | POA: Diagnosis not present

## 2022-07-23 DIAGNOSIS — C61 Malignant neoplasm of prostate: Secondary | ICD-10-CM | POA: Diagnosis not present

## 2022-07-27 DIAGNOSIS — Z23 Encounter for immunization: Secondary | ICD-10-CM | POA: Diagnosis not present

## 2022-07-30 DIAGNOSIS — R3915 Urgency of urination: Secondary | ICD-10-CM | POA: Diagnosis not present

## 2022-07-30 DIAGNOSIS — C61 Malignant neoplasm of prostate: Secondary | ICD-10-CM | POA: Diagnosis not present

## 2022-07-30 DIAGNOSIS — N401 Enlarged prostate with lower urinary tract symptoms: Secondary | ICD-10-CM | POA: Diagnosis not present

## 2022-07-30 DIAGNOSIS — N200 Calculus of kidney: Secondary | ICD-10-CM | POA: Diagnosis not present

## 2022-08-05 DIAGNOSIS — H35363 Drusen (degenerative) of macula, bilateral: Secondary | ICD-10-CM | POA: Diagnosis not present

## 2022-08-10 ENCOUNTER — Ambulatory Visit (HOSPITAL_COMMUNITY)
Admission: RE | Admit: 2022-08-10 | Discharge: 2022-08-10 | Disposition: A | Discharge status: Home / Self Care | Payer: Medicare Other | Source: Ambulatory Visit | Attending: Family Medicine | Admitting: Family Medicine

## 2022-08-10 DIAGNOSIS — R0600 Dyspnea, unspecified: Secondary | ICD-10-CM | POA: Insufficient documentation

## 2022-08-10 LAB — PULMONARY FUNCTION TEST
DL/VA % pred: 98 %
DL/VA: 3.94 ml/min/mmHg/L
DLCO unc % pred: 77 %
DLCO unc: 16.47 ml/min/mmHg
FEF 25-75 Post: 2.94 L/sec
FEF 25-75 Pre: 1.33 L/sec
FEF2575-%Change-Post: 120 %
FEF2575-%Pred-Post: 175 %
FEF2575-%Pred-Pre: 79 %
FEV1-%Change-Post: 17 %
FEV1-%Pred-Post: 100 %
FEV1-%Pred-Pre: 85 %
FEV1-Post: 2.38 L
FEV1-Pre: 2.03 L
FEV1FVC-%Change-Post: 9 %
FEV1FVC-%Pred-Pre: 102 %
FEV6-%Change-Post: 8 %
FEV6-%Pred-Post: 94 %
FEV6-%Pred-Pre: 87 %
FEV6-Post: 2.93 L
FEV6-Pre: 2.7 L
FEV6FVC-%Change-Post: 1 %
FEV6FVC-%Pred-Post: 107 %
FEV6FVC-%Pred-Pre: 106 %
FVC-%Change-Post: 6 %
FVC-%Pred-Post: 87 %
FVC-%Pred-Pre: 82 %
FVC-Post: 2.93 L
FVC-Pre: 2.74 L
Post FEV1/FVC ratio: 81 %
Post FEV6/FVC ratio: 100 %
Pre FEV1/FVC ratio: 74 %
Pre FEV6/FVC Ratio: 99 %
RV % pred: 92 %
RV: 2.14 L
TLC % pred: 79 %
TLC: 4.78 L

## 2022-08-10 MED ORDER — ALBUTEROL SULFATE (2.5 MG/3ML) 0.083% IN NEBU
2.5000 mg | INHALATION_SOLUTION | Freq: Once | RESPIRATORY_TRACT | Status: AC
  Administered 2022-08-10: 2.5 mg via RESPIRATORY_TRACT

## 2022-08-16 ENCOUNTER — Encounter (INDEPENDENT_AMBULATORY_CARE_PROVIDER_SITE_OTHER): Payer: Self-pay | Admitting: Gastroenterology

## 2022-09-03 DIAGNOSIS — I10 Essential (primary) hypertension: Secondary | ICD-10-CM | POA: Diagnosis not present

## 2022-09-03 DIAGNOSIS — R7303 Prediabetes: Secondary | ICD-10-CM | POA: Diagnosis not present

## 2022-09-03 DIAGNOSIS — C61 Malignant neoplasm of prostate: Secondary | ICD-10-CM | POA: Diagnosis not present

## 2022-09-03 DIAGNOSIS — I700 Atherosclerosis of aorta: Secondary | ICD-10-CM | POA: Diagnosis not present

## 2022-09-03 DIAGNOSIS — D509 Iron deficiency anemia, unspecified: Secondary | ICD-10-CM | POA: Diagnosis not present

## 2022-09-15 DIAGNOSIS — D509 Iron deficiency anemia, unspecified: Secondary | ICD-10-CM | POA: Diagnosis not present

## 2022-09-15 DIAGNOSIS — Z0001 Encounter for general adult medical examination with abnormal findings: Secondary | ICD-10-CM | POA: Diagnosis not present

## 2022-09-15 DIAGNOSIS — K219 Gastro-esophageal reflux disease without esophagitis: Secondary | ICD-10-CM | POA: Diagnosis not present

## 2022-09-15 DIAGNOSIS — I700 Atherosclerosis of aorta: Secondary | ICD-10-CM | POA: Diagnosis not present

## 2022-09-15 DIAGNOSIS — M2550 Pain in unspecified joint: Secondary | ICD-10-CM | POA: Diagnosis not present

## 2022-09-15 DIAGNOSIS — G2581 Restless legs syndrome: Secondary | ICD-10-CM | POA: Diagnosis not present

## 2022-09-15 DIAGNOSIS — I10 Essential (primary) hypertension: Secondary | ICD-10-CM | POA: Diagnosis not present

## 2022-09-15 DIAGNOSIS — R7303 Prediabetes: Secondary | ICD-10-CM | POA: Diagnosis not present

## 2022-09-15 DIAGNOSIS — G4700 Insomnia, unspecified: Secondary | ICD-10-CM | POA: Diagnosis not present

## 2022-09-15 DIAGNOSIS — R944 Abnormal results of kidney function studies: Secondary | ICD-10-CM | POA: Diagnosis not present

## 2022-09-15 DIAGNOSIS — K5730 Diverticulosis of large intestine without perforation or abscess without bleeding: Secondary | ICD-10-CM | POA: Diagnosis not present

## 2022-09-15 DIAGNOSIS — F419 Anxiety disorder, unspecified: Secondary | ICD-10-CM | POA: Diagnosis not present

## 2022-09-17 DIAGNOSIS — Z23 Encounter for immunization: Secondary | ICD-10-CM | POA: Diagnosis not present

## 2022-09-22 DIAGNOSIS — C61 Malignant neoplasm of prostate: Secondary | ICD-10-CM | POA: Diagnosis not present

## 2022-09-22 DIAGNOSIS — Z5111 Encounter for antineoplastic chemotherapy: Secondary | ICD-10-CM | POA: Diagnosis not present

## 2022-10-04 DIAGNOSIS — M170 Bilateral primary osteoarthritis of knee: Secondary | ICD-10-CM | POA: Diagnosis not present

## 2022-10-04 DIAGNOSIS — M810 Age-related osteoporosis without current pathological fracture: Secondary | ICD-10-CM | POA: Diagnosis not present

## 2022-10-04 DIAGNOSIS — M19011 Primary osteoarthritis, right shoulder: Secondary | ICD-10-CM | POA: Diagnosis not present

## 2022-10-06 DIAGNOSIS — K5900 Constipation, unspecified: Secondary | ICD-10-CM | POA: Diagnosis not present

## 2022-10-06 DIAGNOSIS — D509 Iron deficiency anemia, unspecified: Secondary | ICD-10-CM | POA: Diagnosis not present

## 2022-10-06 DIAGNOSIS — M810 Age-related osteoporosis without current pathological fracture: Secondary | ICD-10-CM | POA: Diagnosis not present

## 2022-10-06 DIAGNOSIS — Z713 Dietary counseling and surveillance: Secondary | ICD-10-CM | POA: Diagnosis not present

## 2022-10-06 DIAGNOSIS — R944 Abnormal results of kidney function studies: Secondary | ICD-10-CM | POA: Diagnosis not present

## 2022-10-06 DIAGNOSIS — E669 Obesity, unspecified: Secondary | ICD-10-CM | POA: Diagnosis not present

## 2022-10-06 DIAGNOSIS — Z7182 Exercise counseling: Secondary | ICD-10-CM | POA: Diagnosis not present

## 2022-10-06 DIAGNOSIS — Z6830 Body mass index (BMI) 30.0-30.9, adult: Secondary | ICD-10-CM | POA: Diagnosis not present

## 2022-10-06 DIAGNOSIS — I10 Essential (primary) hypertension: Secondary | ICD-10-CM | POA: Diagnosis not present

## 2022-10-16 IMAGING — CT NM PET TUM IMG SKULL BASE T - THIGH
7 series · 25 of 25 positions shown · non-contrast
Comparison: 10/22/2021 abdominopelvic CT. Chest CT of 12/22/2017
also reviewed.

CLINICAL DATA: Prostate cancer diagnosed 11/25/2021.  PSA of 5.9.

EXAM:
NUCLEAR MEDICINE PET SKULL BASE TO THIGH
TECHNIQUE: 9.6 mCi F18 Piflufolastat (Pylarify) was injected intravenously.
Full-ring PET imaging was performed from the skull base to thigh
after the radiotracer. CT data was obtained and used for attenuation
correction and anatomic localization.

[Series 3: pet sk_thigh ac · axial · 5.0mm · 4.07mm/px · z∈[-1086,-22]mm · 5 of 267 slices shown]
[im 1/267]
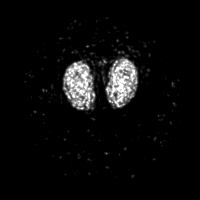
[im 67/267]
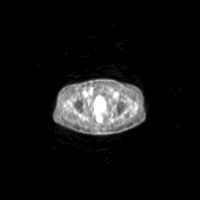
[im 134/267]
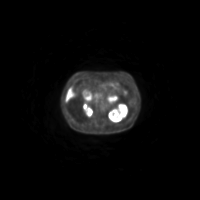
[im 200/267]
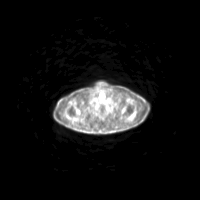
[im 267/267]
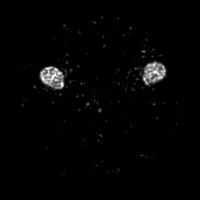

[Series 4: ct sk_thigh 5.0 bf37 · axial · 5.0mm · 0.98mm/px · z∈[-1086,-22]mm · 5 of 267 slices shown]
[im 1/267]
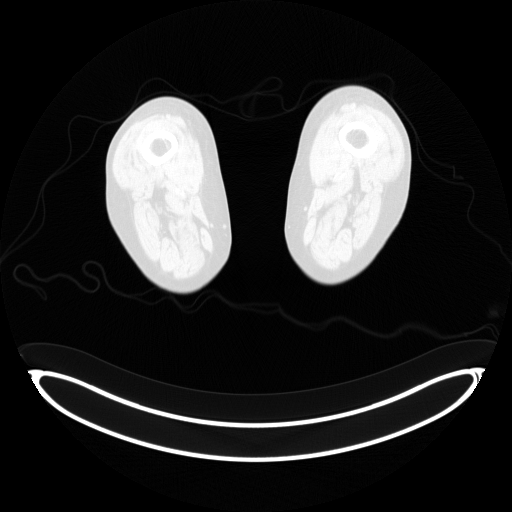
[im 67/267]
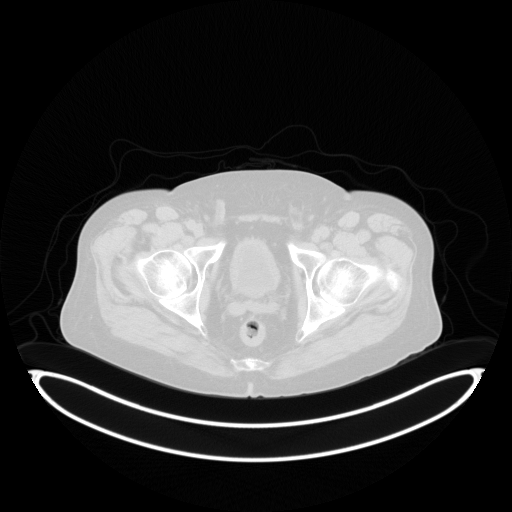
[im 134/267]
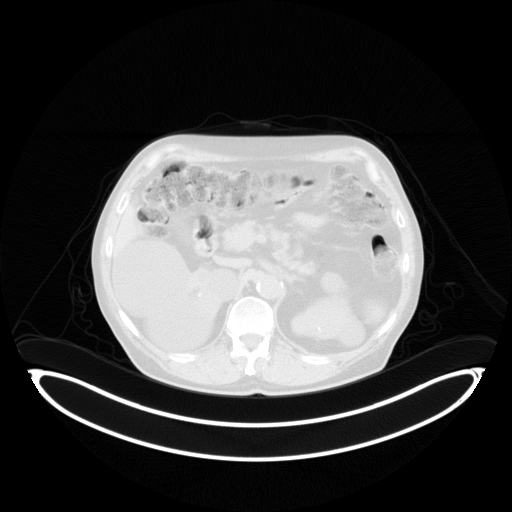
[im 200/267]
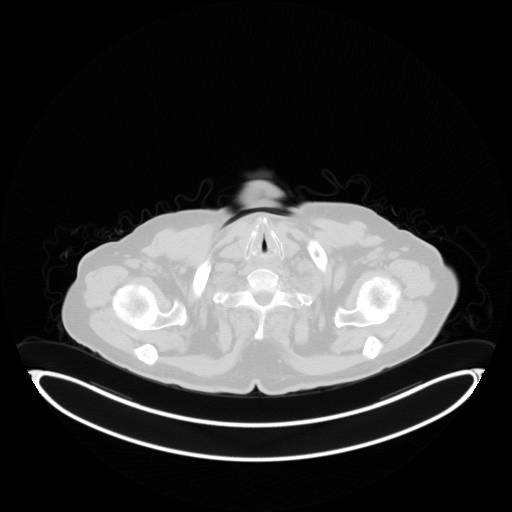
[im 267/267]
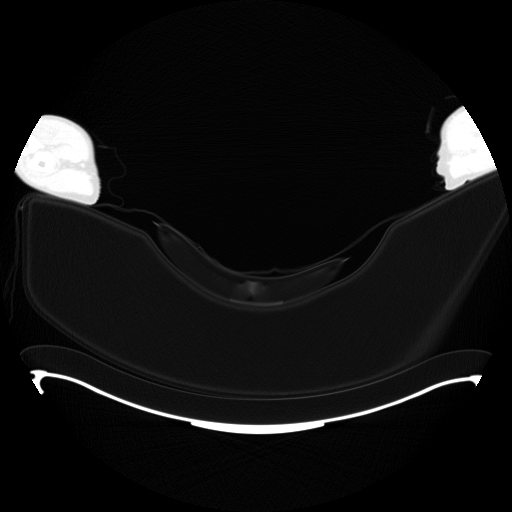

[Series 5: pet sk_thigh nac · axial · 5.0mm · 4.07mm/px · z∈[-1086,-22]mm · 6 of 267 slices shown]
[im 1/267]
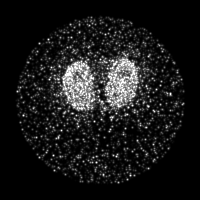
[im 54/267]
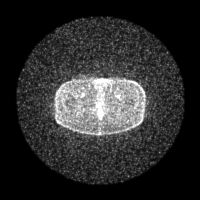
[im 107/267]
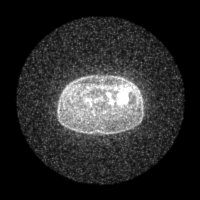
[im 160/267]
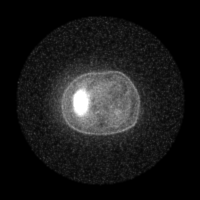
[im 213/267]
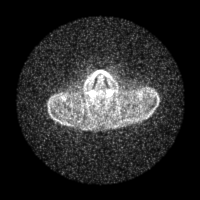
[im 267/267]
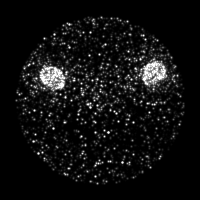

[Series 8: ct sk_thigh 5.0 br59 lung_bone · axial · 5.0mm · 0.70mm/px · z∈[-557,-269]mm · 2 of 73 slices shown]
[im 1/73]
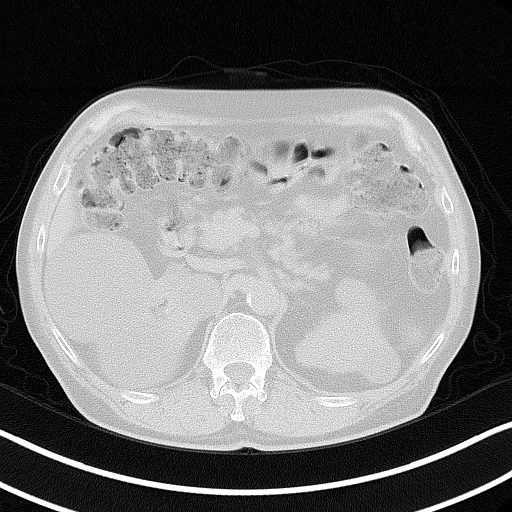
[im 73/73]
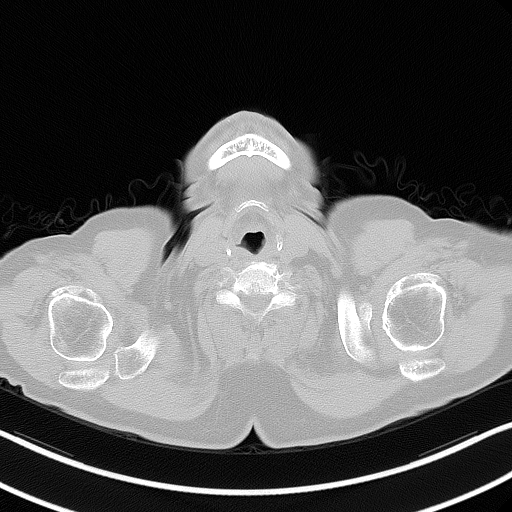

[Series 603: <mip collection> · coronal · 2.21mm/px · 1 of 32 slices shown]
[im 1/32]
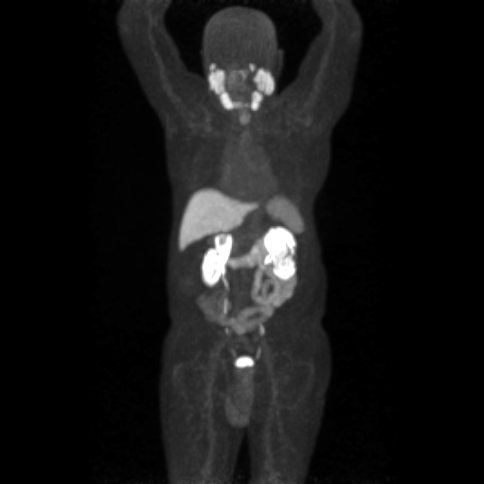

[Series 604: fused cor · 1 of 49 slices shown]
[im 1/49]
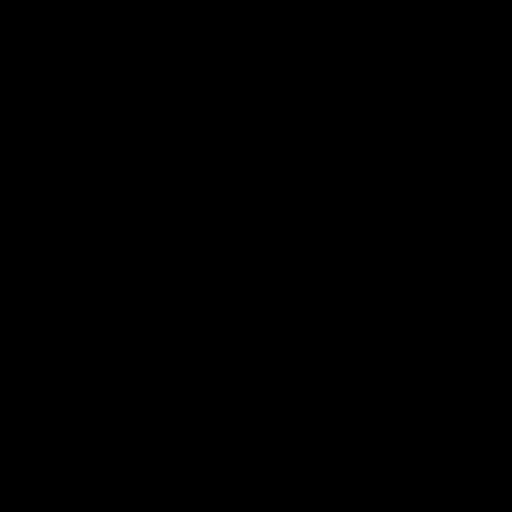

[Series 605: range-ct sk_thigh 5.0 bf37-tra-<alpha range> · 5 of 252 slices shown]
[im 1/252]
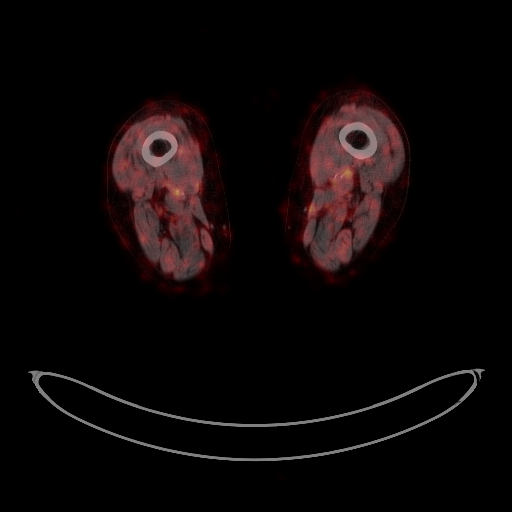
[im 63/252]
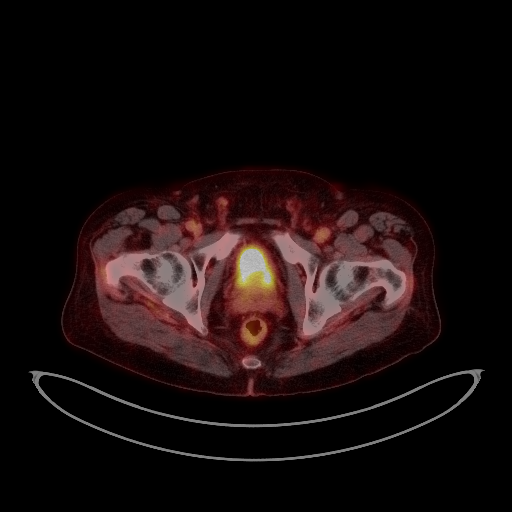
[im 126/252]
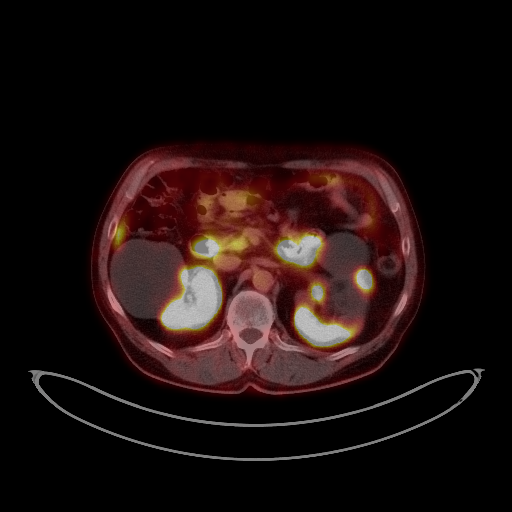
[im 189/252]
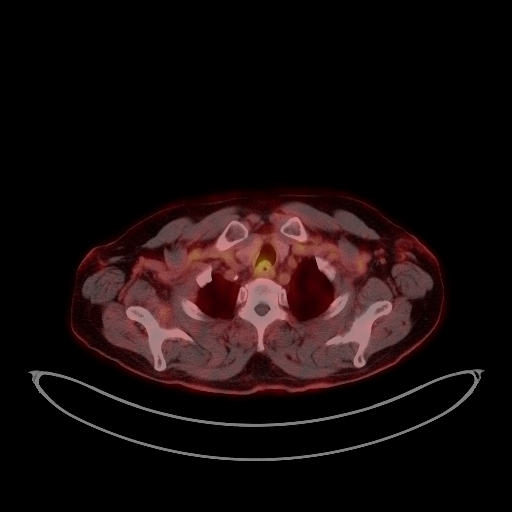
[im 252/252]
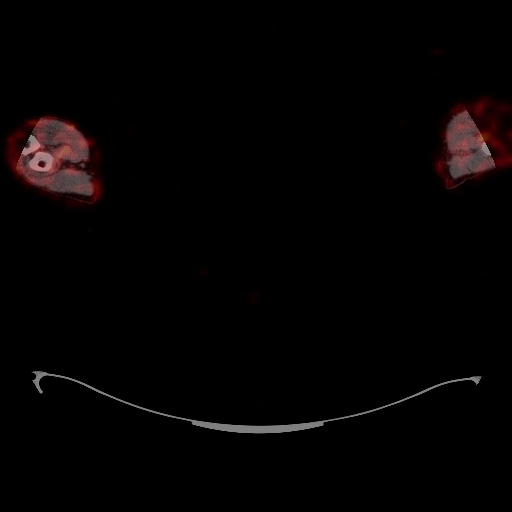

[25 of 25 positions shown; findings below may reference images not displayed]

FINDINGS: NECK

No radiotracer activity in neck lymph nodes.

Incidental CT finding: No cervical adenopathy. Bilateral carotid
atherosclerosis. Mucosal thickening of right greater than left
maxillary sinuses and ethmoid air cells.

CHEST

No radiotracer accumulation within mediastinal or hilar lymph nodes.

Incidental CT finding: Mild cardiomegaly. Small to moderate hiatal
hernia. Aortic and coronary artery calcification. Scattered tiny
calcified and noncalcified pulmonary nodules are similar and benign.
An 11 mm subpleural nodule within the inferior right upper lobe on
38/8 is similar to 12/22/2017 and can be presumed benign.

Anterior left lower lobe 5 mm nodule on 42/8 is similar to on the
prior exam and considered benign.

ABDOMEN/PELVIS

Prostate: Mild bilateral posterior prostate activity, including at a
S.U.V. max of 3.7 on the left and a S.U.V. max of 3.6 on the right.

Lymph nodes: An isolated left inguinal node demonstrates low-level
activity. Example 6 mm and a S.U.V. max of 1.5 on 196/4.

Liver: No evidence of liver metastasis

Incidental CT finding: Normal adrenal glands. Bilateral renal
collecting system calculi. Bilateral low-density renal lesions are
incompletely characterized but likely cysts. Normal noncontrast
appearance of the liver, pancreas, urinary bladder. Mild
prostatomegaly. Uro lift seeds within the prostate. Extensive
colonic diverticulosis.

SKELETON

No focal activity to suggest skeletal metastasis. Remote left rib
fractures.
IMPRESSION: 1. Multifocal relatively mild prostatic uptake, likely representing
primary or primaries.
2. Left inguinal node with low-level hypermetabolism, favored to be
benign/reactive.
3. Otherwise, no tracer avid metastasis identified.
4. Incidental findings, including: Hiatal hernia. Coronary artery
atherosclerosis. Aortic Atherosclerosis ([id number]). Sinus
disease. Bilateral nephrolithiasis. Bilateral pulmonary nodules,
similar back to 12/22/2017 and therefore considered benign.

## 2022-10-27 DIAGNOSIS — C61 Malignant neoplasm of prostate: Secondary | ICD-10-CM | POA: Diagnosis not present

## 2022-11-03 DIAGNOSIS — C61 Malignant neoplasm of prostate: Secondary | ICD-10-CM | POA: Diagnosis not present

## 2022-11-03 DIAGNOSIS — R351 Nocturia: Secondary | ICD-10-CM | POA: Diagnosis not present

## 2022-11-03 DIAGNOSIS — R3915 Urgency of urination: Secondary | ICD-10-CM | POA: Diagnosis not present

## 2022-11-03 DIAGNOSIS — N401 Enlarged prostate with lower urinary tract symptoms: Secondary | ICD-10-CM | POA: Diagnosis not present

## 2022-11-03 DIAGNOSIS — R3914 Feeling of incomplete bladder emptying: Secondary | ICD-10-CM | POA: Diagnosis not present

## 2022-11-16 DIAGNOSIS — J019 Acute sinusitis, unspecified: Secondary | ICD-10-CM | POA: Diagnosis not present

## 2022-12-01 DIAGNOSIS — S30863A Insect bite (nonvenomous) of scrotum and testes, initial encounter: Secondary | ICD-10-CM | POA: Diagnosis not present

## 2022-12-01 DIAGNOSIS — W57XXXA Bitten or stung by nonvenomous insect and other nonvenomous arthropods, initial encounter: Secondary | ICD-10-CM | POA: Diagnosis not present

## 2022-12-01 DIAGNOSIS — R110 Nausea: Secondary | ICD-10-CM | POA: Diagnosis not present

## 2022-12-01 DIAGNOSIS — A084 Viral intestinal infection, unspecified: Secondary | ICD-10-CM | POA: Diagnosis not present

## 2022-12-07 DIAGNOSIS — M79676 Pain in unspecified toe(s): Secondary | ICD-10-CM | POA: Diagnosis not present

## 2022-12-07 DIAGNOSIS — B351 Tinea unguium: Secondary | ICD-10-CM | POA: Diagnosis not present

## 2023-01-31 DIAGNOSIS — M170 Bilateral primary osteoarthritis of knee: Secondary | ICD-10-CM | POA: Diagnosis not present

## 2023-02-04 DIAGNOSIS — I700 Atherosclerosis of aorta: Secondary | ICD-10-CM | POA: Diagnosis not present

## 2023-02-04 DIAGNOSIS — D509 Iron deficiency anemia, unspecified: Secondary | ICD-10-CM | POA: Diagnosis not present

## 2023-02-04 DIAGNOSIS — R7303 Prediabetes: Secondary | ICD-10-CM | POA: Diagnosis not present

## 2023-02-04 DIAGNOSIS — I10 Essential (primary) hypertension: Secondary | ICD-10-CM | POA: Diagnosis not present

## 2023-02-09 DIAGNOSIS — M810 Age-related osteoporosis without current pathological fracture: Secondary | ICD-10-CM | POA: Diagnosis not present

## 2023-02-09 DIAGNOSIS — C61 Malignant neoplasm of prostate: Secondary | ICD-10-CM | POA: Diagnosis not present

## 2023-02-09 DIAGNOSIS — R944 Abnormal results of kidney function studies: Secondary | ICD-10-CM | POA: Diagnosis not present

## 2023-02-09 DIAGNOSIS — D509 Iron deficiency anemia, unspecified: Secondary | ICD-10-CM | POA: Diagnosis not present

## 2023-02-09 DIAGNOSIS — N401 Enlarged prostate with lower urinary tract symptoms: Secondary | ICD-10-CM | POA: Diagnosis not present

## 2023-02-09 DIAGNOSIS — W57XXXA Bitten or stung by nonvenomous insect and other nonvenomous arthropods, initial encounter: Secondary | ICD-10-CM | POA: Diagnosis not present

## 2023-02-09 DIAGNOSIS — M170 Bilateral primary osteoarthritis of knee: Secondary | ICD-10-CM | POA: Diagnosis not present

## 2023-02-09 DIAGNOSIS — G4700 Insomnia, unspecified: Secondary | ICD-10-CM | POA: Diagnosis not present

## 2023-02-09 DIAGNOSIS — I10 Essential (primary) hypertension: Secondary | ICD-10-CM | POA: Diagnosis not present

## 2023-02-09 DIAGNOSIS — F419 Anxiety disorder, unspecified: Secondary | ICD-10-CM | POA: Diagnosis not present

## 2023-02-09 DIAGNOSIS — G2581 Restless legs syndrome: Secondary | ICD-10-CM | POA: Diagnosis not present

## 2023-02-09 DIAGNOSIS — R7303 Prediabetes: Secondary | ICD-10-CM | POA: Diagnosis not present

## 2023-03-08 ENCOUNTER — Telehealth (HOSPITAL_COMMUNITY): Payer: Self-pay

## 2023-03-08 ENCOUNTER — Other Ambulatory Visit (HOSPITAL_COMMUNITY): Payer: Self-pay | Admitting: Pharmacy Technician

## 2023-03-08 NOTE — Telephone Encounter (Signed)
Called patient to schedule Reclast infusion appointment. No answer. Left voicemail requesting call back. Will also send MyChart message.  Wyvonne Lenz, RN

## 2023-03-15 DIAGNOSIS — B351 Tinea unguium: Secondary | ICD-10-CM | POA: Diagnosis not present

## 2023-03-15 DIAGNOSIS — M79676 Pain in unspecified toe(s): Secondary | ICD-10-CM | POA: Diagnosis not present

## 2023-03-17 DIAGNOSIS — Z85828 Personal history of other malignant neoplasm of skin: Secondary | ICD-10-CM | POA: Diagnosis not present

## 2023-03-17 DIAGNOSIS — Z08 Encounter for follow-up examination after completed treatment for malignant neoplasm: Secondary | ICD-10-CM | POA: Diagnosis not present

## 2023-03-17 DIAGNOSIS — D225 Melanocytic nevi of trunk: Secondary | ICD-10-CM | POA: Diagnosis not present

## 2023-03-17 DIAGNOSIS — L821 Other seborrheic keratosis: Secondary | ICD-10-CM | POA: Diagnosis not present

## 2023-03-17 DIAGNOSIS — L814 Other melanin hyperpigmentation: Secondary | ICD-10-CM | POA: Diagnosis not present

## 2023-03-17 DIAGNOSIS — L570 Actinic keratosis: Secondary | ICD-10-CM | POA: Diagnosis not present

## 2023-03-18 ENCOUNTER — Encounter (HOSPITAL_COMMUNITY)
Admission: RE | Admit: 2023-03-18 | Discharge: 2023-03-18 | Disposition: A | Discharge status: Home / Self Care | Payer: Medicare Other | Source: Ambulatory Visit | Attending: Internal Medicine | Admitting: Internal Medicine

## 2023-03-18 VITALS — BP 128/75 | HR 67 | Temp 97.5°F | Resp 18

## 2023-03-18 DIAGNOSIS — M810 Age-related osteoporosis without current pathological fracture: Secondary | ICD-10-CM | POA: Insufficient documentation

## 2023-03-18 MED ORDER — HYDROXYZINE HCL 25 MG PO TABS
25.0000 mg | ORAL_TABLET | Freq: Once | ORAL | Status: AC
  Administered 2023-03-18: 25 mg via ORAL
  Filled 2023-03-18 (×2): qty 1

## 2023-03-18 MED ORDER — ZOLEDRONIC ACID 5 MG/100ML IV SOLN
5.0000 mg | Freq: Once | INTRAVENOUS | Status: AC
  Administered 2023-03-18: 5 mg via INTRAVENOUS
  Filled 2023-03-18: qty 100

## 2023-03-18 MED ORDER — ACETAMINOPHEN 325 MG PO TABS
650.0000 mg | ORAL_TABLET | Freq: Once | ORAL | Status: AC
  Administered 2023-03-18: 650 mg via ORAL
  Filled 2023-03-18: qty 2

## 2023-03-18 NOTE — Progress Notes (Signed)
Diagnosis: Osteoporosis  Provider:  Dwana Melena MD  Procedure: IV Infusion  IV Type: Peripheral, IV Location: R Antecubital  Reclast (Zolendronic Acid), Dose: 5 mg  Infusion Start Time: 1431  Infusion Stop Time: 1458  Post Infusion IV Care: Observation period completed and Peripheral IV Discontinued  Discharge: Condition: Good, Destination: Home . AVS Provided  Performed by:  Marin Shutter, RN

## 2023-04-04 DIAGNOSIS — M7989 Other specified soft tissue disorders: Secondary | ICD-10-CM | POA: Diagnosis not present

## 2023-04-04 DIAGNOSIS — Z713 Dietary counseling and surveillance: Secondary | ICD-10-CM | POA: Diagnosis not present

## 2023-04-04 DIAGNOSIS — Z6829 Body mass index (BMI) 29.0-29.9, adult: Secondary | ICD-10-CM | POA: Diagnosis not present

## 2023-04-13 ENCOUNTER — Other Ambulatory Visit: Payer: Self-pay

## 2023-04-26 DIAGNOSIS — L600 Ingrowing nail: Secondary | ICD-10-CM | POA: Diagnosis not present

## 2023-04-26 DIAGNOSIS — M79674 Pain in right toe(s): Secondary | ICD-10-CM | POA: Diagnosis not present

## 2023-04-26 DIAGNOSIS — L03031 Cellulitis of right toe: Secondary | ICD-10-CM | POA: Diagnosis not present

## 2023-04-28 DIAGNOSIS — E559 Vitamin D deficiency, unspecified: Secondary | ICD-10-CM | POA: Diagnosis not present

## 2023-04-28 DIAGNOSIS — D509 Iron deficiency anemia, unspecified: Secondary | ICD-10-CM | POA: Diagnosis not present

## 2023-04-28 DIAGNOSIS — R5383 Other fatigue: Secondary | ICD-10-CM | POA: Diagnosis not present

## 2023-04-28 DIAGNOSIS — W57XXXA Bitten or stung by nonvenomous insect and other nonvenomous arthropods, initial encounter: Secondary | ICD-10-CM | POA: Diagnosis not present

## 2023-04-28 DIAGNOSIS — S0097XD Other superficial bite of unspecified part of head, subsequent encounter: Secondary | ICD-10-CM | POA: Diagnosis not present

## 2023-04-28 DIAGNOSIS — G2581 Restless legs syndrome: Secondary | ICD-10-CM | POA: Diagnosis not present

## 2023-05-02 DIAGNOSIS — C61 Malignant neoplasm of prostate: Secondary | ICD-10-CM | POA: Diagnosis not present

## 2023-05-09 DIAGNOSIS — R3912 Poor urinary stream: Secondary | ICD-10-CM | POA: Diagnosis not present

## 2023-05-09 DIAGNOSIS — N401 Enlarged prostate with lower urinary tract symptoms: Secondary | ICD-10-CM | POA: Diagnosis not present

## 2023-05-09 DIAGNOSIS — R351 Nocturia: Secondary | ICD-10-CM | POA: Diagnosis not present

## 2023-05-09 DIAGNOSIS — C61 Malignant neoplasm of prostate: Secondary | ICD-10-CM | POA: Diagnosis not present

## 2023-05-12 DIAGNOSIS — L03031 Cellulitis of right toe: Secondary | ICD-10-CM | POA: Diagnosis not present

## 2023-05-12 DIAGNOSIS — M79676 Pain in unspecified toe(s): Secondary | ICD-10-CM | POA: Diagnosis not present

## 2023-06-14 DIAGNOSIS — M79676 Pain in unspecified toe(s): Secondary | ICD-10-CM | POA: Diagnosis not present

## 2023-06-14 DIAGNOSIS — B351 Tinea unguium: Secondary | ICD-10-CM | POA: Diagnosis not present

## 2023-06-27 DIAGNOSIS — K5900 Constipation, unspecified: Secondary | ICD-10-CM | POA: Diagnosis not present

## 2023-06-27 DIAGNOSIS — Z713 Dietary counseling and surveillance: Secondary | ICD-10-CM | POA: Diagnosis not present

## 2023-06-27 DIAGNOSIS — M542 Cervicalgia: Secondary | ICD-10-CM | POA: Diagnosis not present

## 2023-06-27 DIAGNOSIS — Z6830 Body mass index (BMI) 30.0-30.9, adult: Secondary | ICD-10-CM | POA: Diagnosis not present

## 2023-07-01 ENCOUNTER — Other Ambulatory Visit: Payer: Self-pay | Admitting: Otolaryngology

## 2023-07-01 DIAGNOSIS — H9201 Otalgia, right ear: Secondary | ICD-10-CM | POA: Diagnosis not present

## 2023-07-08 ENCOUNTER — Ambulatory Visit
Admission: RE | Admit: 2023-07-08 | Discharge: 2023-07-08 | Disposition: A | Discharge status: Home / Self Care | Payer: Medicare Other | Source: Ambulatory Visit | Attending: Otolaryngology | Admitting: Otolaryngology

## 2023-07-08 DIAGNOSIS — H9201 Otalgia, right ear: Secondary | ICD-10-CM

## 2023-07-08 DIAGNOSIS — M542 Cervicalgia: Secondary | ICD-10-CM | POA: Diagnosis not present

## 2023-07-08 MED ORDER — IOPAMIDOL (ISOVUE-300) INJECTION 61%
75.0000 mL | Freq: Once | INTRAVENOUS | Status: AC | PRN
  Administered 2023-07-08: 65 mL via INTRAVENOUS

## 2023-07-13 DIAGNOSIS — C61 Malignant neoplasm of prostate: Secondary | ICD-10-CM | POA: Diagnosis not present

## 2023-07-19 ENCOUNTER — Other Ambulatory Visit: Payer: Medicare Other

## 2023-07-29 DIAGNOSIS — J069 Acute upper respiratory infection, unspecified: Secondary | ICD-10-CM | POA: Diagnosis not present

## 2023-08-01 DIAGNOSIS — C61 Malignant neoplasm of prostate: Secondary | ICD-10-CM | POA: Diagnosis not present

## 2023-08-06 DIAGNOSIS — Z20822 Contact with and (suspected) exposure to covid-19: Secondary | ICD-10-CM | POA: Diagnosis not present

## 2023-08-06 DIAGNOSIS — R059 Cough, unspecified: Secondary | ICD-10-CM | POA: Diagnosis not present

## 2023-08-06 DIAGNOSIS — J40 Bronchitis, not specified as acute or chronic: Secondary | ICD-10-CM | POA: Diagnosis not present

## 2023-08-06 DIAGNOSIS — I517 Cardiomegaly: Secondary | ICD-10-CM | POA: Diagnosis not present

## 2023-08-08 DIAGNOSIS — N401 Enlarged prostate with lower urinary tract symptoms: Secondary | ICD-10-CM | POA: Diagnosis not present

## 2023-08-08 DIAGNOSIS — R3914 Feeling of incomplete bladder emptying: Secondary | ICD-10-CM | POA: Diagnosis not present

## 2023-08-08 DIAGNOSIS — C61 Malignant neoplasm of prostate: Secondary | ICD-10-CM | POA: Diagnosis not present

## 2023-08-09 DIAGNOSIS — D509 Iron deficiency anemia, unspecified: Secondary | ICD-10-CM | POA: Diagnosis not present

## 2023-08-09 DIAGNOSIS — I10 Essential (primary) hypertension: Secondary | ICD-10-CM | POA: Diagnosis not present

## 2023-08-09 DIAGNOSIS — R7303 Prediabetes: Secondary | ICD-10-CM | POA: Diagnosis not present

## 2023-08-09 DIAGNOSIS — W57XXXA Bitten or stung by nonvenomous insect and other nonvenomous arthropods, initial encounter: Secondary | ICD-10-CM | POA: Diagnosis not present

## 2023-08-12 DIAGNOSIS — H6123 Impacted cerumen, bilateral: Secondary | ICD-10-CM | POA: Diagnosis not present

## 2023-08-12 DIAGNOSIS — H9201 Otalgia, right ear: Secondary | ICD-10-CM | POA: Diagnosis not present

## 2023-08-30 DIAGNOSIS — C61 Malignant neoplasm of prostate: Secondary | ICD-10-CM | POA: Diagnosis not present

## 2023-08-30 DIAGNOSIS — D509 Iron deficiency anemia, unspecified: Secondary | ICD-10-CM | POA: Diagnosis not present

## 2023-08-30 DIAGNOSIS — M19011 Primary osteoarthritis, right shoulder: Secondary | ICD-10-CM | POA: Diagnosis not present

## 2023-08-30 DIAGNOSIS — F5105 Insomnia due to other mental disorder: Secondary | ICD-10-CM | POA: Diagnosis not present

## 2023-08-30 DIAGNOSIS — R944 Abnormal results of kidney function studies: Secondary | ICD-10-CM | POA: Diagnosis not present

## 2023-08-30 DIAGNOSIS — G2581 Restless legs syndrome: Secondary | ICD-10-CM | POA: Diagnosis not present

## 2023-08-30 DIAGNOSIS — I10 Essential (primary) hypertension: Secondary | ICD-10-CM | POA: Diagnosis not present

## 2023-08-30 DIAGNOSIS — N401 Enlarged prostate with lower urinary tract symptoms: Secondary | ICD-10-CM | POA: Diagnosis not present

## 2023-08-30 DIAGNOSIS — Z888 Allergy status to other drugs, medicaments and biological substances status: Secondary | ICD-10-CM | POA: Diagnosis not present

## 2023-08-30 DIAGNOSIS — R7303 Prediabetes: Secondary | ICD-10-CM | POA: Diagnosis not present

## 2023-08-30 DIAGNOSIS — M170 Bilateral primary osteoarthritis of knee: Secondary | ICD-10-CM | POA: Diagnosis not present

## 2023-08-30 DIAGNOSIS — M810 Age-related osteoporosis without current pathological fracture: Secondary | ICD-10-CM | POA: Diagnosis not present

## 2023-09-05 DIAGNOSIS — M170 Bilateral primary osteoarthritis of knee: Secondary | ICD-10-CM | POA: Diagnosis not present

## 2023-09-13 DIAGNOSIS — M79676 Pain in unspecified toe(s): Secondary | ICD-10-CM | POA: Diagnosis not present

## 2023-09-13 DIAGNOSIS — B351 Tinea unguium: Secondary | ICD-10-CM | POA: Diagnosis not present

## 2023-09-22 DIAGNOSIS — Z23 Encounter for immunization: Secondary | ICD-10-CM | POA: Diagnosis not present

## 2023-09-27 DIAGNOSIS — H35371 Puckering of macula, right eye: Secondary | ICD-10-CM | POA: Diagnosis not present

## 2023-10-17 DIAGNOSIS — M19012 Primary osteoarthritis, left shoulder: Secondary | ICD-10-CM | POA: Diagnosis not present

## 2023-10-17 DIAGNOSIS — M19011 Primary osteoarthritis, right shoulder: Secondary | ICD-10-CM | POA: Diagnosis not present

## 2023-10-20 DIAGNOSIS — M19012 Primary osteoarthritis, left shoulder: Secondary | ICD-10-CM | POA: Diagnosis not present

## 2023-11-06 DIAGNOSIS — E663 Overweight: Secondary | ICD-10-CM | POA: Diagnosis not present

## 2023-11-06 DIAGNOSIS — A6920 Lyme disease, unspecified: Secondary | ICD-10-CM | POA: Diagnosis not present

## 2023-11-06 DIAGNOSIS — Z6829 Body mass index (BMI) 29.0-29.9, adult: Secondary | ICD-10-CM | POA: Diagnosis not present

## 2023-11-06 DIAGNOSIS — T07XXXA Unspecified multiple injuries, initial encounter: Secondary | ICD-10-CM | POA: Diagnosis not present

## 2023-11-06 DIAGNOSIS — I10 Essential (primary) hypertension: Secondary | ICD-10-CM | POA: Diagnosis not present

## 2023-11-07 DIAGNOSIS — C61 Malignant neoplasm of prostate: Secondary | ICD-10-CM | POA: Diagnosis not present

## 2023-11-18 DIAGNOSIS — R059 Cough, unspecified: Secondary | ICD-10-CM | POA: Diagnosis not present

## 2023-11-18 DIAGNOSIS — Z20822 Contact with and (suspected) exposure to covid-19: Secondary | ICD-10-CM | POA: Diagnosis not present

## 2023-11-25 ENCOUNTER — Encounter (HOSPITAL_COMMUNITY): Payer: Self-pay

## 2023-11-25 ENCOUNTER — Other Ambulatory Visit: Payer: Self-pay

## 2023-11-25 ENCOUNTER — Emergency Department (HOSPITAL_COMMUNITY): Payer: Medicare Other

## 2023-11-25 ENCOUNTER — Inpatient Hospital Stay (HOSPITAL_COMMUNITY)
Admission: EM | Admit: 2023-11-25 | Discharge: 2023-11-28 | DRG: 641 | Disposition: A | Discharge status: Home / Self Care | Payer: Medicare Other | Attending: Family Medicine | Admitting: Family Medicine

## 2023-11-25 DIAGNOSIS — R0602 Shortness of breath: Secondary | ICD-10-CM | POA: Diagnosis not present

## 2023-11-25 DIAGNOSIS — E871 Hypo-osmolality and hyponatremia: Secondary | ICD-10-CM | POA: Diagnosis present

## 2023-11-25 DIAGNOSIS — G609 Hereditary and idiopathic neuropathy, unspecified: Secondary | ICD-10-CM | POA: Diagnosis not present

## 2023-11-25 DIAGNOSIS — G4700 Insomnia, unspecified: Secondary | ICD-10-CM | POA: Diagnosis not present

## 2023-11-25 DIAGNOSIS — G629 Polyneuropathy, unspecified: Secondary | ICD-10-CM

## 2023-11-25 DIAGNOSIS — F419 Anxiety disorder, unspecified: Secondary | ICD-10-CM | POA: Diagnosis present

## 2023-11-25 DIAGNOSIS — Z880 Allergy status to penicillin: Secondary | ICD-10-CM

## 2023-11-25 DIAGNOSIS — C61 Malignant neoplasm of prostate: Secondary | ICD-10-CM | POA: Diagnosis present

## 2023-11-25 DIAGNOSIS — K219 Gastro-esophageal reflux disease without esophagitis: Secondary | ICD-10-CM | POA: Diagnosis present

## 2023-11-25 DIAGNOSIS — M810 Age-related osteoporosis without current pathological fracture: Secondary | ICD-10-CM | POA: Diagnosis not present

## 2023-11-25 DIAGNOSIS — Z803 Family history of malignant neoplasm of breast: Secondary | ICD-10-CM | POA: Diagnosis not present

## 2023-11-25 DIAGNOSIS — Z8709 Personal history of other diseases of the respiratory system: Secondary | ICD-10-CM | POA: Diagnosis not present

## 2023-11-25 DIAGNOSIS — G2581 Restless legs syndrome: Secondary | ICD-10-CM | POA: Insufficient documentation

## 2023-11-25 DIAGNOSIS — J101 Influenza due to other identified influenza virus with other respiratory manifestations: Secondary | ICD-10-CM | POA: Diagnosis not present

## 2023-11-25 DIAGNOSIS — I10 Essential (primary) hypertension: Secondary | ICD-10-CM | POA: Diagnosis present

## 2023-11-25 DIAGNOSIS — F1320 Sedative, hypnotic or anxiolytic dependence, uncomplicated: Secondary | ICD-10-CM

## 2023-11-25 DIAGNOSIS — Z885 Allergy status to narcotic agent status: Secondary | ICD-10-CM

## 2023-11-25 DIAGNOSIS — R531 Weakness: Principal | ICD-10-CM

## 2023-11-25 DIAGNOSIS — Z888 Allergy status to other drugs, medicaments and biological substances status: Secondary | ICD-10-CM

## 2023-11-25 DIAGNOSIS — Z20822 Contact with and (suspected) exposure to covid-19: Secondary | ICD-10-CM | POA: Diagnosis not present

## 2023-11-25 DIAGNOSIS — Z87891 Personal history of nicotine dependence: Secondary | ICD-10-CM | POA: Diagnosis not present

## 2023-11-25 DIAGNOSIS — F411 Generalized anxiety disorder: Secondary | ICD-10-CM

## 2023-11-25 DIAGNOSIS — I700 Atherosclerosis of aorta: Secondary | ICD-10-CM | POA: Diagnosis not present

## 2023-11-25 DIAGNOSIS — K449 Diaphragmatic hernia without obstruction or gangrene: Secondary | ICD-10-CM | POA: Diagnosis not present

## 2023-11-25 DIAGNOSIS — Z79899 Other long term (current) drug therapy: Secondary | ICD-10-CM | POA: Diagnosis not present

## 2023-11-25 LAB — BASIC METABOLIC PANEL
Anion gap: 13 (ref 5–15)
BUN: 31 mg/dL — ABNORMAL HIGH (ref 8–23)
CO2: 20 mmol/L — ABNORMAL LOW (ref 22–32)
Calcium: 9.3 mg/dL (ref 8.9–10.3)
Chloride: 91 mmol/L — ABNORMAL LOW (ref 98–111)
Creatinine, Ser: 1.11 mg/dL (ref 0.61–1.24)
GFR, Estimated: >60 mL/min (ref >60)
Glucose, Bld: 108 mg/dL — ABNORMAL HIGH (ref 70–99)
Potassium: 4.1 mmol/L (ref 3.5–5.1)
Sodium: 124 mmol/L — ABNORMAL LOW (ref 135–145)

## 2023-11-25 LAB — TROPONIN I (HIGH SENSITIVITY)
Troponin I (High Sensitivity): 9 ng/L (ref <18)
Troponin I (High Sensitivity): 8 ng/L (ref <18)

## 2023-11-25 LAB — CBC WITH DIFFERENTIAL/PLATELET
Abs Immature Granulocytes: 1.07 10*3/uL — ABNORMAL HIGH (ref 0.00–0.07)
Basophils Absolute: 0.1 10*3/uL (ref 0.0–0.1)
Basophils Relative: 1 %
Eosinophils Absolute: 0.1 10*3/uL (ref 0.0–0.5)
Eosinophils Relative: 1 %
HCT: 39.5 % (ref 39.0–52.0)
Hemoglobin: 13.8 g/dL (ref 13.0–17.0)
Immature Granulocytes: 10 %
Lymphocytes Relative: 17 %
Lymphs Abs: 1.8 10*3/uL (ref 0.7–4.0)
MCH: 30.5 pg (ref 26.0–34.0)
MCHC: 34.9 g/dL (ref 30.0–36.0)
MCV: 87.2 fL (ref 80.0–100.0)
Monocytes Absolute: 1 10*3/uL (ref 0.1–1.0)
Monocytes Relative: 9 %
Neutro Abs: 6.7 10*3/uL (ref 1.7–7.7)
Neutrophils Relative %: 62 %
Platelets: 274 10*3/uL (ref 150–400)
RBC: 4.53 MIL/uL (ref 4.22–5.81)
RDW: 13 % (ref 11.5–15.5)
WBC: 10.7 10*3/uL — ABNORMAL HIGH (ref 4.0–10.5)
nRBC: 0 % (ref 0.0–0.2)

## 2023-11-25 MED ORDER — GUAIFENESIN-DM 100-10 MG/5ML PO SYRP: 5.0000 mL | ORAL_SOLUTION | ORAL | Status: DC | PRN

## 2023-11-25 MED ORDER — MELATONIN 3 MG PO TABS
6.0000 mg | ORAL_TABLET | Freq: Once | ORAL | Status: AC
  Administered 2023-11-26: 6 mg via ORAL
  Filled 2023-11-25: qty 2

## 2023-11-25 MED ORDER — SODIUM CHLORIDE 0.9 % IV SOLN: INTRAVENOUS | Status: AC

## 2023-11-25 MED ORDER — ROPINIROLE HCL 0.25 MG PO TABS
0.2500 mg | ORAL_TABLET | Freq: Once | ORAL | Status: AC
  Administered 2023-11-25: 0.25 mg via ORAL
  Filled 2023-11-25: qty 1

## 2023-11-25 MED ORDER — SODIUM CHLORIDE 0.9 % IV BOLUS
500.0000 mL | Freq: Once | INTRAVENOUS | Status: AC
  Administered 2023-11-25: 500 mL via INTRAVENOUS

## 2023-11-25 MED ORDER — DM-GUAIFENESIN ER 30-600 MG PO TB12
1.0000 | ORAL_TABLET | Freq: Two times a day (BID) | ORAL | Status: DC
  Administered 2023-11-26 – 2023-11-28 (×6): 1 via ORAL
  Filled 2023-11-25 (×6): qty 1

## 2023-11-25 NOTE — ED Triage Notes (Signed)
 Pt has had Flu A for 2 weeks with no improvement. Pt seen at [facility] today and was sent here for PNA ruleout.

## 2023-11-25 NOTE — ED Provider Notes (Signed)
 [place] EMERGENCY DEPARTMENT AT [facility] Provider Note   CSN: [id number] Arrival date & time: 11/25/23  5621     History  Chief Complaint  Patient presents with   Shortness of Breath    Cameron Atkins is a 78 y.o. male.  Patient is a 78 year old male who presents emergency department with his wife secondary to increased generalized weakness and cough.  Patient has been sick for approximate the past 2 weeks.  He was recently diagnosed with the flu and was prescribed Tamiflu.  He notes that he did finish his last dose of Tamiflu yesterday.  He has had no associated nausea, vomiting, diarrhea.  He denies any chest pain or shortness of breath.  He has had no dizziness, lightheadedness or syncope.  Wife does note that he has had poor p.o. intake over the past week.   Shortness of Breath      Home Medications Prior to Admission medications   Medication Sig Start Date End Date Taking? Authorizing Provider  cimetidine (TAGAMET) 400 MG tablet Take 1 tablet (400 mg total) by mouth daily as needed. 06/26/13   Rehman, Joline Maxcy, MD  fish oil-omega-3 fatty acids 1000 MG capsule Take 2 g by mouth daily after supper.    [provider]  gabapentin (NEURONTIN) 600 MG tablet TAKE 1 TABLET BY MOUTH THREE TIMES DAILY Patient taking differently: Take 600 mg by mouth in the morning and at bedtime. 10/17/19   Raliegh Ip, DO  hydrOXYzine (VISTARIL) 25 MG capsule Take 25 mg by mouth daily as needed for itching. 10/27/21   [provider]  Iron-FA-B Cmp-C-Biot-Probiotic (FUSION PLUS) CAPS Take 1 capsule by mouth daily. 09/22/21   [provider]  lisinopril (ZESTRIL) 40 MG tablet Take 40 mg by mouth daily. 08/14/20   [provider]  LORazepam (ATIVAN) 1 MG tablet Take 1 tablet (1 mg total) by mouth every 6 (six) hours as needed for anxiety. Patient taking differently: Take 1 mg by mouth every 8 (eight) hours as needed for anxiety. 06/11/19    Raliegh Ip, DO  Multiple Vitamin (MULTIVITAMIN) tablet Take 1 tablet by mouth daily.    [provider]  pantoprazole (PROTONIX) 40 MG tablet TAKE 1 TABLET BY MOUTH EVERY MORNING 12/04/18   Setzer, Terri L, NP  rOPINIRole (REQUIP) 0.25 MG tablet TAKE THREE TO FOUR TABLETS BY MOUTH AT BEDTIME Patient taking differently: Take 0.5-2.25 mg by mouth as needed (restless legs). 09/17/19   Raliegh Ip, DO      Allergies    Benadryl [diphenhydramine], Hctz [hydrochlorothiazide], Mepergan [meperidine-promethazine], Other, Oxycodone, Amoxicillin, Flagyl [metronidazole], and Singulair [montelukast sodium]    Review of Systems   Review of Systems  Respiratory:  Positive for shortness of breath.   All other systems reviewed and are negative.   Physical Exam Updated Vital Signs BP (!) 177/91   Pulse 98   Temp 97.7 F (36.5 C)   Resp (!) 27   Ht 5' 5.5" (1.664 m)   Wt 74.8 kg   SpO2 93%   BMI 27.04 kg/m  Physical Exam Vitals and nursing note reviewed.  Constitutional:      Appearance: Normal appearance.  HENT:     Head: Normocephalic and atraumatic.     Nose: Nose normal.     Mouth/Throat:     Mouth: Mucous membranes are moist.  Eyes:     Extraocular Movements: Extraocular movements intact.     Conjunctiva/sclera: Conjunctivae normal.  Pupils: Pupils are equal, round, and reactive to light.  Cardiovascular:     Rate and Rhythm: Normal rate and regular rhythm.     Pulses: Normal pulses.     Heart sounds: Normal heart sounds.  Pulmonary:     Effort: Pulmonary effort is normal. No tachypnea.     Breath sounds: Normal breath sounds. No decreased breath sounds, wheezing, rhonchi or rales.  Chest:     Chest wall: No tenderness.  Abdominal:     General: Abdomen is flat. Bowel sounds are normal.     Palpations: Abdomen is soft. There is no mass.     Tenderness: There is no abdominal tenderness.  Musculoskeletal:        General: Normal range of motion.      Cervical back: Normal range of motion and neck supple.     Right lower leg: No edema.     Left lower leg: No edema.  Skin:    General: Skin is warm and dry.     Findings: No rash.  Neurological:     General: No focal deficit present.     Mental Status: He is alert and oriented to person, place, and time. Mental status is at baseline.  Psychiatric:        Mood and Affect: Mood normal.        Behavior: Behavior normal.        Thought Content: Thought content normal.        Judgment: Judgment normal.     ED Results / Procedures / Treatments   Labs (all labs ordered are listed, but only abnormal results are displayed) Labs Reviewed  CBC WITH DIFFERENTIAL/PLATELET - Abnormal; Notable for the following components:      Result Value   WBC 10.7 (*)    Abs Immature Granulocytes 1.07 (*)    All other components within normal limits  BASIC METABOLIC PANEL - Abnormal; Notable for the following components:   Sodium 124 (*)    Chloride 91 (*)    CO2 20 (*)    Glucose, Bld 108 (*)    BUN 31 (*)    All other components within normal limits  TROPONIN I (HIGH SENSITIVITY)    EKG EKG Interpretation Date/Time:  Friday November 25 2023 21:04:23 EST Ventricular Rate:  77 PR Interval:  161 QRS Duration:  86 QT Interval:  371 QTC Calculation: 420 R Axis:   18  Text Interpretation: Sinus rhythm No significant change since prior 7/23 Confirmed by Meridee Score [phone]) on 11/25/2023 9:12:29 PM  Radiology DG Chest Portable 1 View Result Date: 11/25/2023 CLINICAL DATA:  Shortness of breath EXAM: PORTABLE CHEST 1 VIEW COMPARISON:  08/06/2023 FINDINGS: Cardiac shadow is within normal limits. Hiatal hernia is again seen. Aortic calcifications are noted. Lungs are clear bilaterally. No bony abnormality is noted. IMPRESSION: No active disease. Electronically Signed   By: Alcide Clever M.D.   On: 11/25/2023 20:50    Procedures .Critical Care  Performed by: Lelon Perla, PA-C Authorized  by: Lelon Perla, PA-C   Critical care provider statement:    Critical care time (minutes):  35   Critical care was necessary to treat or prevent imminent or life-threatening deterioration of the following conditions: Hyponatremia.   Critical care was time spent personally by me on the following activities:  Development of treatment plan with patient or surrogate, discussions with consultants, evaluation of patient's response to treatment, examination of patient, ordering and review of laboratory studies, ordering and  review of radiographic studies, ordering and performing treatments and interventions, pulse oximetry, re-evaluation of patient's condition and review of old charts   I assumed direction of critical care for this patient from another provider in my specialty: no     Care discussed with: admitting provider       Medications Ordered in ED Medications  sodium chloride 0.9 % bolus 500 mL (500 mLs Intravenous New Bag/Given 11/25/23 2109)    ED Course/ Medical Decision Making/ A&P Clinical Course as of 11/25/23 2155  Fri Nov 25, 2023  2143 Sodium(!): 124 [CR]    Clinical Course User Index [CR] Lelon Perla, PA-C                                 Medical Decision Making Amount and/or Complexity of Data Reviewed Labs: ordered. Decision-making details documented in ED Course. Radiology: ordered.  Risk Prescription drug management. Decision regarding hospitalization.   This patient presents to the ED for concern of generalized weakness, this involves an extensive number of treatment options, and is a complaint that carries with it a high risk of complications and morbidity.  The differential diagnosis includes acute viral syndrome, electrolyte derangement, pneumonia, sepsis, dehydration   Co morbidities that complicate the patient evaluation  None   Additional history obtained:  Additional history obtained from spouse External records from outside  source obtained and reviewed including none   Lab Tests:  I Ordered, and personally interpreted labs.  The pertinent results include: Hyponatremia, mild leukocytosis, hypochloremia   Imaging Studies ordered:  I ordered imaging studies including chest x-ray I independently visualized and interpreted imaging which showed no acute intrathoracic process I agree with the radiologist interpretation   Cardiac Monitoring: / EKG:  The patient was maintained on a cardiac monitor.  I personally viewed and interpreted the cardiac monitored which showed an underlying rhythm of: Normal sinus rhythm, no ischemic changes, no STEMI, normal PR/QRS interval, normal QTc   Consultations Obtained:  I requested consultation with the hospitalist,  and discussed lab and imaging findings as well as pertinent plan - they recommend: Admission   Problem List / ED Course / Critical interventions / Medication management  Patient is doing well at this time and does remain stable.  Discussed with patient we will plan for admission to the hospitalist service given his hyponatremia and progressive worsening weakness.  Patient was started on IV fluids in the emergency department and will continue to slowly hydrate him at this time.  Chest x-ray demonstrated no indication for pneumonia.  Do suspect his symptoms are secondary to his recent influenza and poor p.o. intake.  He has no concerning neurological deficits noted on exam and no signs of seizure-like activity.  Patient case was discussed with Dr. Thomes Dinning who has excepted at this time. I ordered medication including IV fluids for hyponatremia Reevaluation of the patient after these medicines showed that the patient improved I have reviewed the patients home medicines and have made adjustments as needed   Social Determinants of Health:  None   Test / Admission - Considered:  Admission        Final Clinical Impression(s) / ED Diagnoses Final diagnoses:   None    Rx / DC Orders ED Discharge Orders     None         Kathlen Mody 11/25/23 2213    Terrilee Files, MD 11/26/23 1113

## 2023-11-25 NOTE — H&P (Signed)
 History and Physical    Patient: Cameron Atkins SWF:[id number] DOB: [birth year] DOA: 11/25/2023 DOS: the patient was seen and examined on 11/26/2023 PCP: Benita Stabile, MD  Patient coming from: Home  Chief Complaint:  Chief Complaint  Patient presents with   Shortness of Breath   HPI: Cameron Atkins is a 78 y.o. male with medical history significant of hypertension, GERD, RLS, peripheral neuropathy who presents to the emergency department accompanied by wife due to increased generalized weakness and nonproductive cough which has been ongoing for about 7 to 10 days.  He was diagnosed on Friday (2/21) with influenza and was prescribed with Tamiflu which he completed yesterday.  He continued to complain of increased weakness but denies nausea, vomiting, diarrhea, chest pain, shortness of breath.  Patient has had poor oral intake over the past week, so wife has been encouraging patient with fluids including Gatorade, coffee, water.  ED Course:  In the emergency department, BP was 150/97, other vital signs are within normal range.  Workup in the ED shows normal CBC except for WBC of 10.7, BMP showed sodium 124, potassium 4.1, chloride 91, bicarb 20, blood glucose 108, BUN 31, creatinine 1.11.  Troponin x 2 was normal. Chest x-ray showed no active disease Patient was started on IV NS, ropinirole was given.  Hospitalist was asked to admit patient for further evaluation and management.  Review of Systems: Review of systems as noted in the HPI. All other systems reviewed and are negative.   Past Medical History:  Diagnosis Date   Anxiety    Arthritis    Bladder stone    Depression    Diverticulosis of colon    GERD (gastroesophageal reflux disease)    H/O hiatal hernia    Helicobacter pylori gastritis    History of colonoscopy with polypectomy    TUBULAR ADENOMA-- 2012   History of esophageal dilatation    STRICTURE--  LAST DONE 03/2013   Hypertension    for 5 yrs   IDA (iron deficiency  anemia) 11/22/2018   Left ureteral calculus    Osteoporosis    Restless legs syndrome    Wears glasses    Past Surgical History:  Procedure Laterality Date   BIOPSY  09/10/2016   Procedure: BIOPSY;  Surgeon: Malissa Hippo, MD;  [facility];  Service: Endoscopy;;  rectal polyp   COLONOSCOPY WITH PROPOFOL N/A 09/10/2016   Procedure: COLONOSCOPY WITH PROPOFOL;  Surgeon: Malissa Hippo, MD;  [facility];  Service: Endoscopy;  Laterality: N/A;   COLONOSCOPY WITH PROPOFOL N/A 11/25/2021   Procedure: COLONOSCOPY WITH PROPOFOL;  Surgeon: Malissa Hippo, MD;  [facility];  Service: Endoscopy;  Laterality: N/A;  1:15  ASA 1   CYSTO/  URETEROSCOPIC LITHOTRIPSY/ STONE EXTRACTION  1995   CYSTOSCOPY W/ RETROGRADES Right 01/15/2014   Procedure: CYSTOSCOPY WITH RIGHT RETROGRADE PYELOGRAM;  Surgeon: Bjorn Pippin, MD;  Location: [facility];  Service: Urology;  Laterality: Right;   CYSTOSCOPY WITH RETROGRADE PYELOGRAM, URETEROSCOPY AND STENT PLACEMENT Left 01/15/2014   Procedure: CYSTOSCOPY , LEFT RETROGRADE PYELOGRAM, LEFT URETEROSCOPY, BASKET STONE EXTRACTION;  Surgeon: Bjorn Pippin, MD;  Location: [facility];  Service: Urology;  Laterality: Left;   ESOPHAGEAL DILATION  03/21/2013   Procedure: ESOPHAGEAL DILATION;  Surgeon: West Bali, MD;  [facility];  Service: Endoscopy;;   ESOPHAGEAL DILATION N/A 09/10/2016   Procedure: ESOPHAGEAL DILATION;  Surgeon: Malissa Hippo, MD;  [facility];  Service: Endoscopy;  Laterality: N/A;   ESOPHAGOGASTRODUODENOSCOPY N/A 03/21/2013   Procedure: ESOPHAGOGASTRODUODENOSCOPY (EGD);  Surgeon: West Bali, MD;  [facility];  Service: Endoscopy;  Laterality: N/A;   ESOPHAGOGASTRODUODENOSCOPY (EGD) WITH ESOPHAGEAL DILATION N/A 04/26/2013   Procedure: ESOPHAGOGASTRODUODENOSCOPY (EGD) WITH ESOPHAGEAL DILATION;  Surgeon: Malissa Hippo, MD;  [facility];  Service:  Endoscopy;  Laterality: N/A;  130   ESOPHAGOGASTRODUODENOSCOPY (EGD) WITH PROPOFOL N/A 09/10/2016   Procedure: ESOPHAGOGASTRODUODENOSCOPY (EGD) WITH PROPOFOL;  Surgeon: Malissa Hippo, MD;  [facility];  Service: Endoscopy;  Laterality: N/A;  10:20   EXTRACORPOREAL SHOCK WAVE LITHOTRIPSY  X2   YRS AGO   GOLD SEED IMPLANT N/A 04/06/2022   Procedure: GOLD SEED IMPLANT;  Surgeon: Rene Paci, MD;  Location: [facility];  Service: Urology;  Laterality: N/A;   HOLMIUM LASER APPLICATION Left 01/15/2014   Procedure: HOLMIUM LASER APPLICATION, LEFT URETER;  Surgeon: Bjorn Pippin, MD;  Location: [facility];  Service: Urology;  Laterality: Left;   POLYPECTOMY  11/25/2021   Procedure: POLYPECTOMY INTESTINAL;  Surgeon: Malissa Hippo, MD;  [facility];  Service: Endoscopy;;   SHOULDER ARTHROSCOPY W/ SUBACROMIAL DECOMPRESSION AND DISTAL CLAVICLE EXCISION Left 2000   SPACE OAR INSTILLATION N/A 04/06/2022   Procedure: SPACE OAR INSTILLATION;  Surgeon: Rene Paci, MD;  Location: [facility];  Service: Urology;  Laterality: N/A;    Social History:  reports that he quit smoking about 54 years ago. His smoking use included cigarettes. He started smoking about 56 years ago. He has a 0.5 pack-year smoking history. He quit smokeless tobacco use about 24 years ago.  His smokeless tobacco use included chew. He reports that he does not currently use alcohol. He reports that he does not use drugs.   Allergies  Allergen Reactions   Benadryl [Diphenhydramine] Other (See Comments)    anxious   Hctz [Hydrochlorothiazide]     hyponatremia   Mepergan [Meperidine-Promethazine] Nausea And Vomiting   Other     Antidepressants--per patient he cannot take.   Oxycodone Itching    hyperactivity   Amoxicillin     Has patient had a PCN reaction causing immediate rash, facial/tongue/throat swelling, SOB or lightheadedness with  hypotension:No Has patient had a PCN reaction causing severe rash involving mucus membranes or skin necrosis:No Has patient had a PCN reaction that required hospitalization:No Has patient had a PCN reaction occurring within the last 10 years:Yes If all of the above answers are "NO", then may proceed with Cephalosporin use. "severe anxiety-ativan does not remedy"   Flagyl [Metronidazole] Anxiety   Singulair [Montelukast Sodium] Anxiety    Family History  Problem Relation Age of Onset   Anxiety disorder Father    Breast cancer Sister 32   Breast cancer Maternal Aunt    Breast cancer Maternal Aunt    Breast cancer Maternal Aunt    Breast cancer Paternal Grandmother    Anxiety disorder Daughter    Anxiety disorder Son      Prior to Admission medications   Medication Sig Start Date End Date Taking? Authorizing Provider  cimetidine (TAGAMET) 400 MG tablet Take 1 tablet (400 mg total) by mouth daily as needed. 06/26/13   Rehman, Joline Maxcy, MD  fish oil-omega-3 fatty acids 1000 MG capsule Take 2 g by mouth daily after supper.    [provider]  gabapentin (NEURONTIN) 600 MG tablet TAKE 1 TABLET BY MOUTH THREE TIMES  DAILY Patient taking differently: Take 600 mg by mouth in the morning and at bedtime. 10/17/19   Raliegh Ip, DO  hydrOXYzine (VISTARIL) 25 MG capsule Take 25 mg by mouth daily as needed for itching. 10/27/21   [provider]  Iron-FA-B Cmp-C-Biot-Probiotic (FUSION PLUS) CAPS Take 1 capsule by mouth daily. 09/22/21   [provider]  lisinopril (ZESTRIL) 40 MG tablet Take 40 mg by mouth daily. 08/14/20   [provider]  LORazepam (ATIVAN) 1 MG tablet Take 1 tablet (1 mg total) by mouth every 6 (six) hours as needed for anxiety. Patient taking differently: Take 1 mg by mouth every 8 (eight) hours as needed for anxiety. 06/11/19   Raliegh Ip, DO  Multiple Vitamin (MULTIVITAMIN) tablet Take 1 tablet by mouth daily.    [provider]  pantoprazole (PROTONIX) 40 MG tablet TAKE 1 TABLET BY MOUTH EVERY MORNING 12/04/18   Setzer, Terri L, NP  rOPINIRole (REQUIP) 0.25 MG tablet TAKE THREE TO FOUR TABLETS BY MOUTH AT BEDTIME Patient taking differently: Take 0.5-2.25 mg by mouth as needed (restless legs). 09/17/19   Raliegh Ip, DO    Physical Exam: BP (!) 177/91   Pulse 98   Temp 97.7 F (36.5 C)   Resp (!) 27   Ht 5' 5.5" (1.664 m)   Wt 74.8 kg   SpO2 93%   BMI 27.04 kg/m   General: 78 y.o. year-old male well developed well nourished in no acute distress.  Alert and oriented x3. HEENT: NCAT, EOMI Neck: Supple, trachea medial Cardiovascular: Regular rate and rhythm with no rubs or gallops.  No thyromegaly or JVD noted.  No lower extremity edema. 2/4 pulses in all 4 extremities. Respiratory: Clear to auscultation with no wheezes or rales. Good inspiratory effort. Abdomen: Soft, nontender nondistended with normal bowel sounds x4 quadrants. Muskuloskeletal: No cyanosis, clubbing or edema noted bilaterally Neuro: CN II-XII intact, strength 5/5 x 4, sensation, reflexes intact Skin: No ulcerative lesions noted or rashes Psychiatry: Judgement and insight appear normal. Mood is appropriate for condition and setting          Labs on Admission:  Basic Metabolic Panel: Recent Labs  Lab 11/25/23 2038  NA 124*  K 4.1  CL 91*  CO2 20*  GLUCOSE 108*  BUN 31*  CREATININE 1.11  CALCIUM 9.3   Liver Function Tests: No results for input(s): "AST", "ALT", "ALKPHOS", "BILITOT", "PROT", "ALBUMIN" in the last 168 hours. No results for input(s): "LIPASE", "AMYLASE" in the last 168 hours. No results for input(s): "AMMONIA" in the last 168 hours. CBC: Recent Labs  Lab 11/25/23 2038  WBC 10.7*  NEUTROABS 6.7  HGB 13.8  HCT 39.5  MCV 87.2  PLT 274   Cardiac Enzymes: No results for input(s): "CKTOTAL", "CKMB", "CKMBINDEX", "TROPONINI" in the last 168 hours.  BNP (last 3 results) No results for  input(s): "BNP" in the last 8760 hours.  ProBNP (last 3 results) No results for input(s): "PROBNP" in the last 8760 hours.  CBG: No results for input(s): "GLUCAP" in the last 168 hours.  Radiological Exams on Admission: DG Chest Portable 1 View Result Date: 11/25/2023 CLINICAL DATA:  Shortness of breath EXAM: PORTABLE CHEST 1 VIEW COMPARISON:  08/06/2023 FINDINGS: Cardiac shadow is within normal limits. Hiatal hernia is again seen. Aortic calcifications are noted. Lungs are clear bilaterally. No bony abnormality is noted. IMPRESSION: No active disease. Electronically Signed   By: Alcide Clever M.D.   On: 11/25/2023 20:50  EKG: I independently viewed the EKG done and my findings are as followed: Sinus rhythm at a rate of 77 bpm   Assessment/Plan Present on Admission:  Hyponatremia  GERD (gastroesophageal reflux disease)  Anxiety  Principal Problem:   Hyponatremia Active Problems:   GERD (gastroesophageal reflux disease)   Anxiety   Generalized weakness   Influenza A   RLS (restless legs syndrome)   Peripheral neuropathy   Essential hypertension  Hyponatremia Na 124 Patient was started on gentle hydration Continue to monitor sodium with serial BMPs Urine osmolality, serum osmolality and urine sodium will be checked  Generalized weakness in the setting of above Continue fall precaution Continue PT/OT eval and treat  Influenza A Patient already completed Tamiflu Continue Mucinex, Robitussin Continue incentive telemetry Continue symptomatic treatment  RLS Continue ropinirole  Peripheral neuropathy Continue gabapentin  Essential hypertension Continue lisinopril  GERD Continue Protonix  Anxiety Continue Ativan per home regimen  History of prostate cancer Patient has grade 4 prostate cancer being treated with ADT via [place] Patient was treated with external beam radiation therapy Patient follows with Dr. Annabell Howells (urology)   DVT prophylaxis: Lovenox  Code  Status: Full code  Family Communication: Wife at bedside (all questions answered to satisfaction)  Consults: None  Severity of Illness: The appropriate patient status for this patient is INPATIENT. Inpatient status is judged to be reasonable and necessary in order to provide the required intensity of service to ensure the patient's safety. The patient's presenting symptoms, physical exam findings, and initial radiographic and laboratory data in the context of their chronic comorbidities is felt to place them at high risk for further clinical deterioration. Furthermore, it is not anticipated that the patient will be medically stable for discharge from the hospital within 2 midnights of admission.   * I certify that at the point of admission it is my clinical judgment that the patient will require inpatient hospital care spanning beyond 2 midnights from the point of admission due to high intensity of service, high risk for further deterioration and high frequency of surveillance required.*  Author: Frankey Shown, DO 11/26/2023 12:15 AM  For on call review [email or web address].

## 2023-11-25 NOTE — H&P (Incomplete)
 History and Physical    Patient: Cameron Atkins ZOX:[id number] DOB: [birth year] DOA: 11/25/2023 DOS: the patient was seen and examined on 11/25/2023 PCP: Cameron Stabile, MD  Patient coming from: Home  Chief Complaint:  Chief Complaint  Patient presents with  . Shortness of Breath   HPI: Cameron Atkins is a 78 y.o. male with medical history significant of hypertension, GERD, RLS, peripheral neuropathy who presents to the emergency department accompanied by wife due to increased generalized weakness and nonproductive cough which has been ongoing for about 7 to 10 days.  He was diagnosed on Friday (2/21) with influenza and was prescribed with Tamiflu which he completed yesterday.  He continued to complain of increased weakness but denies nausea, vomiting, diarrhea, chest pain, shortness of breath.  Patient has had poor oral intake over the past week, so wife has been encouraging patient with fluids including Gatorade, coffee, water.  ED Course:  In the emergency department, BP was 150/97, other vital signs are within normal range.  Workup in the ED shows normal CBC except for WBC of 10.7, BMP showed sodium 124, potassium 4.1, chloride 91, bicarb 20, blood glucose 108, BUN 31, creatinine 1.11.  Troponin x 2 was normal. Chest x-ray showed no active disease Patient was started on IV NS, ropinirole was given.  Hospitalist was asked to admit patient for further evaluation and management.  Review of Systems: Review of systems as noted in the HPI. All other systems reviewed and are negative.   Past Medical History:  Diagnosis Date  . Anxiety   . Arthritis   . Bladder stone   . Depression   . Diverticulosis of colon   . GERD (gastroesophageal reflux disease)   . H/O hiatal hernia   . Helicobacter pylori gastritis   . History of colonoscopy with polypectomy    TUBULAR ADENOMA-- 2012  . History of esophageal dilatation    STRICTURE--  LAST DONE 03/2013  . Hypertension    for 5 yrs  . IDA (iron  deficiency anemia) 11/22/2018  . Left ureteral calculus   . Osteoporosis   . Restless legs syndrome   . Wears glasses    Past Surgical History:  Procedure Laterality Date  . BIOPSY  09/10/2016   Procedure: BIOPSY;  Surgeon: Malissa Hippo, MD;  [facility];  Service: Endoscopy;;  rectal polyp  . COLONOSCOPY WITH PROPOFOL N/A 09/10/2016   Procedure: COLONOSCOPY WITH PROPOFOL;  Surgeon: Malissa Hippo, MD;  [facility];  Service: Endoscopy;  Laterality: N/A;  . COLONOSCOPY WITH PROPOFOL N/A 11/25/2021   Procedure: COLONOSCOPY WITH PROPOFOL;  Surgeon: Malissa Hippo, MD;  [facility];  Service: Endoscopy;  Laterality: N/A;  1:15  ASA 1  . CYSTO/  URETEROSCOPIC LITHOTRIPSY/ STONE EXTRACTION  1995  . CYSTOSCOPY W/ RETROGRADES Right 01/15/2014   Procedure: CYSTOSCOPY WITH RIGHT RETROGRADE PYELOGRAM;  Surgeon: Bjorn Pippin, MD;  Location: [facility];  Service: Urology;  Laterality: Right;  . CYSTOSCOPY WITH RETROGRADE PYELOGRAM, URETEROSCOPY AND STENT PLACEMENT Left 01/15/2014   Procedure: CYSTOSCOPY , LEFT RETROGRADE PYELOGRAM, LEFT URETEROSCOPY, BASKET STONE EXTRACTION;  Surgeon: Bjorn Pippin, MD;  Location: [facility];  Service: Urology;  Laterality: Left;  . ESOPHAGEAL DILATION  03/21/2013   Procedure: ESOPHAGEAL DILATION;  Surgeon: West Bali, MD;  [facility];  Service: Endoscopy;;  . ESOPHAGEAL DILATION N/A 09/10/2016   Procedure: ESOPHAGEAL DILATION;  Surgeon: Malissa Hippo, MD;  [facility];  Service: Endoscopy;  Laterality: N/A;  . ESOPHAGOGASTRODUODENOSCOPY N/A 03/21/2013   Procedure: ESOPHAGOGASTRODUODENOSCOPY (EGD);  Surgeon: West Bali, MD;  [facility];  Service: Endoscopy;  Laterality: N/A;  . ESOPHAGOGASTRODUODENOSCOPY (EGD) WITH ESOPHAGEAL DILATION N/A 04/26/2013   Procedure: ESOPHAGOGASTRODUODENOSCOPY (EGD) WITH ESOPHAGEAL DILATION;  Surgeon: Malissa Hippo, MD;  [facility];  Service: Endoscopy;  Laterality: N/A;  130  . ESOPHAGOGASTRODUODENOSCOPY (EGD) WITH PROPOFOL N/A 09/10/2016   Procedure: ESOPHAGOGASTRODUODENOSCOPY (EGD) WITH PROPOFOL;  Surgeon: Malissa Hippo, MD;  [facility];  Service: Endoscopy;  Laterality: N/A;  10:20  . EXTRACORPOREAL SHOCK WAVE LITHOTRIPSY  X2   YRS AGO  . GOLD SEED IMPLANT N/A 04/06/2022   Procedure: GOLD SEED IMPLANT;  Surgeon: Rene Paci, MD;  Location: [facility];  Service: Urology;  Laterality: N/A;  . HOLMIUM LASER APPLICATION Left 01/15/2014   Procedure: HOLMIUM LASER APPLICATION, LEFT URETER;  Surgeon: Bjorn Pippin, MD;  Location: [facility];  Service: Urology;  Laterality: Left;  . POLYPECTOMY  11/25/2021   Procedure: POLYPECTOMY INTESTINAL;  Surgeon: Malissa Hippo, MD;  [facility];  Service: Endoscopy;;  . SHOULDER ARTHROSCOPY W/ SUBACROMIAL DECOMPRESSION AND DISTAL CLAVICLE EXCISION Left 2000  . SPACE OAR INSTILLATION N/A 04/06/2022   Procedure: SPACE OAR INSTILLATION;  Surgeon: Rene Paci, MD;  Location: [facility];  Service: Urology;  Laterality: N/A;    Social History:  reports that he quit smoking about 54 years ago. His smoking use included cigarettes. He started smoking about 56 years ago. He has a 0.5 pack-year smoking history. He quit smokeless tobacco use about 24 years ago.  His smokeless tobacco use included chew. He reports that he does not currently use alcohol. He reports that he does not use drugs.   Allergies  Allergen Reactions  . Benadryl [Diphenhydramine] Other (See Comments)    anxious  . Hctz [Hydrochlorothiazide]     hyponatremia  . Mepergan [Meperidine-Promethazine] Nausea And Vomiting  . Other     Antidepressants--per patient he cannot take.  Marland Kitchen Oxycodone Itching    hyperactivity  . Amoxicillin     Has patient had a PCN reaction causing immediate rash, facial/tongue/throat  swelling, SOB or lightheadedness with hypotension:No Has patient had a PCN reaction causing severe rash involving mucus membranes or skin necrosis:No Has patient had a PCN reaction that required hospitalization:No Has patient had a PCN reaction occurring within the last 10 years:Yes If all of the above answers are "NO", then may proceed with Cephalosporin use. "severe anxiety-ativan does not remedy"  . Flagyl [Metronidazole] Anxiety  . Singulair [Montelukast Sodium] Anxiety    Family History  Problem Relation Age of Onset  . Anxiety disorder Father   . Breast cancer Sister 61  . Breast cancer Maternal Aunt   . Breast cancer Maternal Aunt   . Breast cancer Maternal Aunt   . Breast cancer Paternal Grandmother   . Anxiety disorder Daughter   . Anxiety disorder Son     ***  Prior to Admission medications   Medication Sig Start Date End Date Taking? Authorizing Provider  cimetidine (TAGAMET) 400 MG tablet Take 1 tablet (400 mg total) by mouth daily as needed. 06/26/13   Rehman, Joline Maxcy, MD  fish oil-omega-3 fatty acids 1000 MG capsule Take 2 g by mouth daily after supper.    [provider]  gabapentin (NEURONTIN) 600 MG tablet TAKE 1 TABLET BY MOUTH THREE  TIMES DAILY Patient taking differently: Take 600 mg by mouth in the morning and at bedtime. 10/17/19   Raliegh Ip, DO  hydrOXYzine (VISTARIL) 25 MG capsule Take 25 mg by mouth daily as needed for itching. 10/27/21   [provider]  Iron-FA-B Cmp-C-Biot-Probiotic (FUSION PLUS) CAPS Take 1 capsule by mouth daily. 09/22/21   [provider]  lisinopril (ZESTRIL) 40 MG tablet Take 40 mg by mouth daily. 08/14/20   [provider]  LORazepam (ATIVAN) 1 MG tablet Take 1 tablet (1 mg total) by mouth every 6 (six) hours as needed for anxiety. Patient taking differently: Take 1 mg by mouth every 8 (eight) hours as needed for anxiety. 06/11/19   Raliegh Ip, DO  Multiple Vitamin (MULTIVITAMIN)  tablet Take 1 tablet by mouth daily.    [provider]  pantoprazole (PROTONIX) 40 MG tablet TAKE 1 TABLET BY MOUTH EVERY MORNING 12/04/18   Setzer, Terri L, NP  rOPINIRole (REQUIP) 0.25 MG tablet TAKE THREE TO FOUR TABLETS BY MOUTH AT BEDTIME Patient taking differently: Take 0.5-2.25 mg by mouth as needed (restless legs). 09/17/19   Raliegh Ip, DO    Physical Exam: BP (!) 177/91   Pulse 98   Temp 97.7 F (36.5 C)   Resp (!) 27   Ht 5' 5.5" (1.664 m)   Wt 74.8 kg   SpO2 93%   BMI 27.04 kg/m   General: 78 y.o. year-old male well developed well nourished in no acute distress.  Alert and oriented x3. HEENT: NCAT, EOMI Neck: Supple, trachea medial Cardiovascular: Regular rate and rhythm with no rubs or gallops.  No thyromegaly or JVD noted.  No lower extremity edema. 2/4 pulses in all 4 extremities. Respiratory: Clear to auscultation with no wheezes or rales. Good inspiratory effort. Abdomen: Soft, nontender nondistended with normal bowel sounds x4 quadrants. Muskuloskeletal: No cyanosis, clubbing or edema noted bilaterally Neuro: CN II-XII intact, strength 5/5 x 4, sensation, reflexes intact Skin: No ulcerative lesions noted or rashes Psychiatry: Judgement and insight appear normal. Mood is appropriate for condition and setting          Labs on Admission:  Basic Metabolic Panel: Recent Labs  Lab 11/25/23 2038  NA 124*  K 4.1  CL 91*  CO2 20*  GLUCOSE 108*  BUN 31*  CREATININE 1.11  CALCIUM 9.3   Liver Function Tests: No results for input(s): "AST", "ALT", "ALKPHOS", "BILITOT", "PROT", "ALBUMIN" in the last 168 hours. No results for input(s): "LIPASE", "AMYLASE" in the last 168 hours. No results for input(s): "AMMONIA" in the last 168 hours. CBC: Recent Labs  Lab 11/25/23 2038  WBC 10.7*  NEUTROABS 6.7  HGB 13.8  HCT 39.5  MCV 87.2  PLT 274   Cardiac Enzymes: No results for input(s): "CKTOTAL", "CKMB", "CKMBINDEX", "TROPONINI" in the last 168  hours.  BNP (last 3 results) No results for input(s): "BNP" in the last 8760 hours.  ProBNP (last 3 results) No results for input(s): "PROBNP" in the last 8760 hours.  CBG: No results for input(s): "GLUCAP" in the last 168 hours.  Radiological Exams on Admission: DG Chest Portable 1 View Result Date: 11/25/2023 CLINICAL DATA:  Shortness of breath EXAM: PORTABLE CHEST 1 VIEW COMPARISON:  08/06/2023 FINDINGS: Cardiac shadow is within normal limits. Hiatal hernia is again seen. Aortic calcifications are noted. Lungs are clear bilaterally. No bony abnormality is noted. IMPRESSION: No active disease. Electronically Signed   By: Alcide Clever M.D.   On: 11/25/2023 20:50  EKG: I independently viewed the EKG done and my findings are as followed: Sinus rhythm at a rate of 77 bpm   Assessment/Plan Present on Admission: . Hyponatremia  Principal Problem:   Hyponatremia  Hyponatremia Na 124 Patient was started on gentle hydration Continue to monitor sodium with serial BMPs Urine osmolality, serum osmolality and urine sodium will be checked  Generalized weakness in the setting of above Continue fall precaution Continue PT/OT eval and treat  Influenza A Patient already completed Tamiflu Continue Mucinex, Robitussin Continue symptomatic treatment  RLS Continue ropinirole  Peripheral neuropathy Continue gabapentin  Essential hypertension Continue lisinopril  GERD Continue Protonix   DVT prophylaxis: ***   Code Status: ***   Family Communication: ***   Disposition Plan: ***   Consults called: ***   Admission status: ***     Frankey Shown MD [facility] Pager [phone]  If 7PM-7AM, please contact night-coverage [email or web address] Password [facility]  11/25/2023, 11:19 PM       Review of Systems: {ROS_Text:26778} Past Medical History:  Diagnosis Date  . Anxiety   . Arthritis   . Bladder stone   . Depression   . Diverticulosis of colon   . GERD  (gastroesophageal reflux disease)   . H/O hiatal hernia   . Helicobacter pylori gastritis   . History of colonoscopy with polypectomy    TUBULAR ADENOMA-- 2012  . History of esophageal dilatation    STRICTURE--  LAST DONE 03/2013  . Hypertension    for 5 yrs  . IDA (iron deficiency anemia) 11/22/2018  . Left ureteral calculus   . Osteoporosis   . Restless legs syndrome   . Wears glasses    Past Surgical History:  Procedure Laterality Date  . BIOPSY  09/10/2016   Procedure: BIOPSY;  Surgeon: Malissa Hippo, MD;  [facility];  Service: Endoscopy;;  rectal polyp  . COLONOSCOPY WITH PROPOFOL N/A 09/10/2016   Procedure: COLONOSCOPY WITH PROPOFOL;  Surgeon: Malissa Hippo, MD;  [facility];  Service: Endoscopy;  Laterality: N/A;  . COLONOSCOPY WITH PROPOFOL N/A 11/25/2021   Procedure: COLONOSCOPY WITH PROPOFOL;  Surgeon: Malissa Hippo, MD;  [facility];  Service: Endoscopy;  Laterality: N/A;  1:15  ASA 1  . CYSTO/  URETEROSCOPIC LITHOTRIPSY/ STONE EXTRACTION  1995  . CYSTOSCOPY W/ RETROGRADES Right 01/15/2014   Procedure: CYSTOSCOPY WITH RIGHT RETROGRADE PYELOGRAM;  Surgeon: Bjorn Pippin, MD;  Location[facility];  Service: Urology;  Laterality: Right;  . CYSTOSCOPY WITH RETROGRADE PYELOGRAM, URETEROSCOPY AND STENT PLACEMENT Left 01/15/2014   Procedure: CYSTOSCOPY , LEFT RETROGRADE PYELOGRAM, LEFT URETEROSCOPY, BASKET STONE EXTRACTION;  Surgeon: Bjorn Pippin, MD;  Location: [facility];  Service: Urology;  Laterality: Left;  . ESOPHAGEAL DILATION  03/21/2013   Procedure: ESOPHAGEAL DILATION;  Surgeon: West Bali, MD;  [facility];  Service: Endoscopy;;  . ESOPHAGEAL DILATION N/A 09/10/2016   Procedure: ESOPHAGEAL DILATION;  Surgeon: Malissa Hippo, MD;  [facility];  Service: Endoscopy;  Laterality: N/A;  . ESOPHAGOGASTRODUODENOSCOPY N/A 03/21/2013   Procedure: ESOPHAGOGASTRODUODENOSCOPY (EGD);   Surgeon: West Bali, MD;  [facility];  Service: Endoscopy;  Laterality: N/A;  . ESOPHAGOGASTRODUODENOSCOPY (EGD) WITH ESOPHAGEAL DILATION N/A 04/26/2013   Procedure: ESOPHAGOGASTRODUODENOSCOPY (EGD) WITH ESOPHAGEAL DILATION;  Surgeon: Malissa Hippo, MD;  [facility];  Service: Endoscopy;  Laterality: N/A;  130  . ESOPHAGOGASTRODUODENOSCOPY (EGD) WITH PROPOFOL N/A 09/10/2016   Procedure: ESOPHAGOGASTRODUODENOSCOPY (EGD)  WITH PROPOFOL;  Surgeon: Malissa Hippo, MD;  [facility];  Service: Endoscopy;  Laterality: N/A;  10:20  . EXTRACORPOREAL SHOCK WAVE LITHOTRIPSY  X2   YRS AGO  . GOLD SEED IMPLANT N/A 04/06/2022   Procedure: GOLD SEED IMPLANT;  Surgeon: Rene Paci, MD;  Location[facility];  Service: Urology;  Laterality: N/A;  . HOLMIUM LASER APPLICATION Left 01/15/2014   Procedure: HOLMIUM LASER APPLICATION, LEFT URETER;  Surgeon: Bjorn Pippin, MD;  Location: [facility];  Service: Urology;  Laterality: Left;  . POLYPECTOMY  11/25/2021   Procedure: POLYPECTOMY INTESTINAL;  Surgeon: Malissa Hippo, MD;  [facility];  Service: Endoscopy;;  . SHOULDER ARTHROSCOPY W/ SUBACROMIAL DECOMPRESSION AND DISTAL CLAVICLE EXCISION Left 2000  . SPACE OAR INSTILLATION N/A 04/06/2022   Procedure: SPACE OAR INSTILLATION;  Surgeon: Rene Paci, MD;  Location: [facility];  Service: Urology;  Laterality: N/A;   Social History:  reports that he quit smoking about 54 years ago. His smoking use included cigarettes. He started smoking about 56 years ago. He has a 0.5 pack-year smoking history. He quit smokeless tobacco use about 24 years ago.  His smokeless tobacco use included chew. He reports that he does not currently use alcohol. He reports that he does not use drugs.  Allergies  Allergen Reactions  . Benadryl [Diphenhydramine] Other (See Comments)    anxious  . Hctz [Hydrochlorothiazide]      hyponatremia  . Mepergan [Meperidine-Promethazine] Nausea And Vomiting  . Other     Antidepressants--per patient he cannot take.  Marland Kitchen Oxycodone Itching    hyperactivity  . Amoxicillin     Has patient had a PCN reaction causing immediate rash, facial/tongue/throat swelling, SOB or lightheadedness with hypotension:No Has patient had a PCN reaction causing severe rash involving mucus membranes or skin necrosis:No Has patient had a PCN reaction that required hospitalization:No Has patient had a PCN reaction occurring within the last 10 years:Yes If all of the above answers are "NO", then may proceed with Cephalosporin use. "severe anxiety-ativan does not remedy"  . Flagyl [Metronidazole] Anxiety  . Singulair [Montelukast Sodium] Anxiety    Family History  Problem Relation Age of Onset  . Anxiety disorder Father   . Breast cancer Sister 66  . Breast cancer Maternal Aunt   . Breast cancer Maternal Aunt   . Breast cancer Maternal Aunt   . Breast cancer Paternal Grandmother   . Anxiety disorder Daughter   . Anxiety disorder Son     Prior to Admission medications   Medication Sig Start Date End Date Taking? Authorizing Provider  cimetidine (TAGAMET) 400 MG tablet Take 1 tablet (400 mg total) by mouth daily as needed. 06/26/13   Rehman, Joline Maxcy, MD  fish oil-omega-3 fatty acids 1000 MG capsule Take 2 g by mouth daily after supper.    [provider]  gabapentin (NEURONTIN) 600 MG tablet TAKE 1 TABLET BY MOUTH THREE TIMES DAILY Patient taking differently: Take 600 mg by mouth in the morning and at bedtime. 10/17/19   Raliegh Ip, DO  hydrOXYzine (VISTARIL) 25 MG capsule Take 25 mg by mouth daily as needed for itching. 10/27/21   [provider]  Iron-FA-B Cmp-C-Biot-Probiotic (FUSION PLUS) CAPS Take 1 capsule by mouth daily. 09/22/21   [provider]  lisinopril (ZESTRIL) 40 MG tablet Take 40 mg by mouth daily. 08/14/20   [provider]   LORazepam (ATIVAN) 1 MG tablet Take  1 tablet (1 mg total) by mouth every 6 (six) hours as needed for anxiety. Patient taking differently: Take 1 mg by mouth every 8 (eight) hours as needed for anxiety. 06/11/19   Raliegh Ip, DO  Multiple Vitamin (MULTIVITAMIN) tablet Take 1 tablet by mouth daily.    [provider]  pantoprazole (PROTONIX) 40 MG tablet TAKE 1 TABLET BY MOUTH EVERY MORNING 12/04/18   Setzer, Terri L, NP  rOPINIRole (REQUIP) 0.25 MG tablet TAKE THREE TO FOUR TABLETS BY MOUTH AT BEDTIME Patient taking differently: Take 0.5-2.25 mg by mouth as needed (restless legs). 09/17/19   Raliegh Ip, DO    Physical Exam: Vitals:   11/25/23 2012 11/25/23 2115 11/25/23 2130 11/25/23 2145  BP: (!) 158/97 (!) 152/82 (!) 161/90 (!) 177/91  Pulse: 87 74 77 98  Resp: 18 16 17  (!) 27  Temp: 97.7 F (36.5 C)     SpO2: 99% 98% 95% 93%  Weight:      Height:       *** Data Reviewed: {Tip this will not be part of the note when signed- Document your independent interpretation of telemetry tracing, EKG, lab, Radiology test or any other diagnostic tests. Add any new diagnostic test ordered today. (Optional):26781} {Results:26384}  Assessment and Plan: No notes have been filed under this hospital service. Service: Hospitalist     Advance Care Planning:   Code Status: Not on file ***  Consults: ***  Family Communication: ***  Severity of Illness: {Observation/Inpatient:21159}  Author: Frankey Shown, DO 11/25/2023 10:06 PM  For on call review [email or web address].

## 2023-11-26 DIAGNOSIS — R531 Weakness: Secondary | ICD-10-CM | POA: Diagnosis not present

## 2023-11-26 DIAGNOSIS — G629 Polyneuropathy, unspecified: Secondary | ICD-10-CM

## 2023-11-26 DIAGNOSIS — E871 Hypo-osmolality and hyponatremia: Secondary | ICD-10-CM | POA: Diagnosis not present

## 2023-11-26 DIAGNOSIS — J101 Influenza due to other identified influenza virus with other respiratory manifestations: Secondary | ICD-10-CM | POA: Insufficient documentation

## 2023-11-26 DIAGNOSIS — G2581 Restless legs syndrome: Secondary | ICD-10-CM | POA: Diagnosis not present

## 2023-11-26 DIAGNOSIS — I10 Essential (primary) hypertension: Secondary | ICD-10-CM | POA: Diagnosis not present

## 2023-11-26 LAB — BASIC METABOLIC PANEL
Anion gap: 10 (ref 5–15)
BUN: 25 mg/dL — ABNORMAL HIGH (ref 8–23)
CO2: 22 mmol/L (ref 22–32)
Calcium: 9 mg/dL (ref 8.9–10.3)
Chloride: 93 mmol/L — ABNORMAL LOW (ref 98–111)
Creatinine, Ser: 1.02 mg/dL (ref 0.61–1.24)
GFR, Estimated: >60 mL/min (ref >60)
Glucose, Bld: 113 mg/dL — ABNORMAL HIGH (ref 70–99)
Potassium: 3.9 mmol/L (ref 3.5–5.1)
Sodium: 125 mmol/L — ABNORMAL LOW (ref 135–145)

## 2023-11-26 LAB — HEPATIC FUNCTION PANEL
ALT: 25 U/L (ref 0–44)
AST: 27 U/L (ref 15–41)
Albumin: 3.1 g/dL — ABNORMAL LOW (ref 3.5–5.0)
Alkaline Phosphatase: 35 U/L — ABNORMAL LOW (ref 38–126)
Bilirubin, Direct: 0.1 mg/dL (ref 0.0–0.2)
Indirect Bilirubin: 0.7 mg/dL (ref 0.3–0.9)
Total Bilirubin: 0.8 mg/dL (ref 0.0–1.2)
Total Protein: 6 g/dL — ABNORMAL LOW (ref 6.5–8.1)

## 2023-11-26 LAB — SODIUM: Sodium: 126 mmol/L — ABNORMAL LOW (ref 135–145)

## 2023-11-26 LAB — OSMOLALITY: Osmolality: 274 mosm/kg — ABNORMAL LOW (ref 275–295)

## 2023-11-26 LAB — CBC
HCT: 37.5 % — ABNORMAL LOW (ref 39.0–52.0)
Hemoglobin: 12.8 g/dL — ABNORMAL LOW (ref 13.0–17.0)
MCH: 30 pg (ref 26.0–34.0)
MCHC: 34.1 g/dL (ref 30.0–36.0)
MCV: 88 fL (ref 80.0–100.0)
Platelets: 264 10*3/uL (ref 150–400)
RBC: 4.26 MIL/uL (ref 4.22–5.81)
RDW: 13 % (ref 11.5–15.5)
WBC: 9.2 10*3/uL (ref 4.0–10.5)
nRBC: 0 % (ref 0.0–0.2)

## 2023-11-26 LAB — PHOSPHORUS: Phosphorus: 2.9 mg/dL (ref 2.5–4.6)

## 2023-11-26 LAB — OSMOLALITY, URINE: Osmolality, Ur: 290 mosm/kg — ABNORMAL LOW (ref 300–900)

## 2023-11-26 LAB — SODIUM, URINE, RANDOM: Sodium, Ur: 51 mmol/L

## 2023-11-26 LAB — MAGNESIUM: Magnesium: 1.8 mg/dL (ref 1.7–2.4)

## 2023-11-26 MED ORDER — GABAPENTIN 300 MG PO CAPS
600.0000 mg | ORAL_CAPSULE | Freq: Two times a day (BID) | ORAL | Status: DC
  Administered 2023-11-26 – 2023-11-28 (×5): 600 mg via ORAL
  Filled 2023-11-26 (×5): qty 2

## 2023-11-26 MED ORDER — ROPINIROLE HCL 0.25 MG PO TABS: 0.5000 mg | ORAL_TABLET | ORAL | Status: DC | PRN

## 2023-11-26 MED ORDER — PANTOPRAZOLE SODIUM 40 MG PO TBEC
40.0000 mg | DELAYED_RELEASE_TABLET | Freq: Every morning | ORAL | Status: DC
  Administered 2023-11-26 – 2023-11-28 (×3): 40 mg via ORAL
  Filled 2023-11-26 (×3): qty 1

## 2023-11-26 MED ORDER — ENOXAPARIN SODIUM 40 MG/0.4ML IJ SOSY
40.0000 mg | PREFILLED_SYRINGE | INTRAMUSCULAR | Status: DC
  Administered 2023-11-26 – 2023-11-28 (×3): 40 mg via SUBCUTANEOUS
  Filled 2023-11-26 (×3): qty 0.4

## 2023-11-26 MED ORDER — ROPINIROLE HCL 1 MG PO TABS: 1.2500 mg | ORAL_TABLET | Freq: Two times a day (BID) | ORAL | Status: DC

## 2023-11-26 MED ORDER — ROPINIROLE HCL 1 MG PO TABS
1.2500 mg | ORAL_TABLET | Freq: Two times a day (BID) | ORAL | Status: DC
  Administered 2023-11-26 – 2023-11-28 (×4): 1.25 mg via ORAL
  Filled 2023-11-26 (×4): qty 1

## 2023-11-26 MED ORDER — ROPINIROLE HCL 1 MG PO TABS: 1.5000 mg | ORAL_TABLET | Freq: Every day | ORAL | Status: DC

## 2023-11-26 MED ORDER — ACETAMINOPHEN 325 MG PO TABS
650.0000 mg | ORAL_TABLET | Freq: Four times a day (QID) | ORAL | Status: DC | PRN
  Administered 2023-11-27 (×2): 650 mg via ORAL
  Filled 2023-11-26 (×2): qty 2

## 2023-11-26 MED ORDER — ONDANSETRON HCL 4 MG/2ML IJ SOLN: 4.0000 mg | Freq: Four times a day (QID) | INTRAMUSCULAR | Status: DC | PRN

## 2023-11-26 MED ORDER — LORAZEPAM 1 MG PO TABS
1.0000 mg | ORAL_TABLET | Freq: Three times a day (TID) | ORAL | Status: DC | PRN
  Administered 2023-11-26: 1 mg via ORAL
  Filled 2023-11-26: qty 1

## 2023-11-26 MED ORDER — ONDANSETRON HCL 4 MG PO TABS: 4.0000 mg | ORAL_TABLET | Freq: Four times a day (QID) | ORAL | Status: DC | PRN

## 2023-11-26 MED ORDER — LISINOPRIL 10 MG PO TABS
40.0000 mg | ORAL_TABLET | Freq: Every day | ORAL | Status: DC
  Administered 2023-11-26 – 2023-11-28 (×3): 40 mg via ORAL
  Filled 2023-11-26 (×3): qty 4

## 2023-11-26 MED ORDER — DOXEPIN HCL 10 MG PO CAPS
10.0000 mg | ORAL_CAPSULE | Freq: Every evening | ORAL | Status: DC | PRN
  Administered 2023-11-26: 10 mg via ORAL
  Filled 2023-11-26 (×2): qty 1

## 2023-11-26 MED ORDER — ROPINIROLE HCL 1 MG PO TABS
1.5000 mg | ORAL_TABLET | Freq: Every day | ORAL | Status: DC
  Administered 2023-11-26 – 2023-11-27 (×2): 1.5 mg via ORAL
  Filled 2023-11-26 (×2): qty 2

## 2023-11-26 MED ORDER — PSYLLIUM 95 % PO PACK
1.0000 | PACK | Freq: Every day | ORAL | Status: DC
  Administered 2023-11-27 – 2023-11-28 (×2): 1 via ORAL
  Filled 2023-11-26 (×2): qty 1

## 2023-11-26 MED ORDER — ACETAMINOPHEN 650 MG RE SUPP: 650.0000 mg | Freq: Four times a day (QID) | RECTAL | Status: DC | PRN

## 2023-11-26 NOTE — Hospital Course (Signed)
 78 y.o. male with medical history significant of hypertension, GERD, RLS, peripheral neuropathy who presents to the emergency department accompanied by wife due to increased generalized weakness and nonproductive cough which has been ongoing for about 7 to 10 days.  He was diagnosed on Friday (2/21) with influenza and was prescribed with Tamiflu which he completed yesterday.  He continued to complain of increased weakness but denies nausea, vomiting, diarrhea, chest pain, shortness of breath.  Patient has had poor oral intake over the past week, so wife has been encouraging patient with fluids including Gatorade, coffee, water.   In the emergency department, BP was 150/97, other vital signs are within normal range.  Workup in the ED shows normal CBC except for WBC of 10.7, BMP showed sodium 124, potassium 4.1, chloride 91, bicarb 20, blood glucose 108, BUN 31, creatinine 1.11.  Troponin x 2 was normal. Chest x-ray showed no active disease. Patient was started on IV NS, ropinirole was given.  Hospitalist was asked to admit patient for further evaluation and management.

## 2023-11-26 NOTE — Progress Notes (Addendum)
 PROGRESS NOTE   Cameron Atkins  ZOX:[id number] DOB: December 07, 1945 DOA: 11/25/2023 PCP: Benita Stabile, MD   Chief Complaint  Patient presents with   Shortness of Breath   Level of care: Telemetry  Brief Admission History:   78 y.o. male with medical history significant of hypertension, GERD, RLS, peripheral neuropathy who presents to the emergency department accompanied by wife due to increased generalized weakness and nonproductive cough which has been ongoing for about 7 to 10 days.  He was diagnosed on Friday (2/21) with influenza and was prescribed with Tamiflu which he completed yesterday.  He continued to complain of increased weakness but denies nausea, vomiting, diarrhea, chest pain, shortness of breath.  Patient has had poor oral intake over the past week, so wife has been encouraging patient with fluids including Gatorade, coffee, water.   In the emergency department, BP was 150/97, other vital signs are within normal range.  Workup in the ED shows normal CBC except for WBC of 10.7, BMP showed sodium 124, potassium 4.1, chloride 91, bicarb 20, blood glucose 108, BUN 31, creatinine 1.11.  Troponin x 2 was normal. Chest x-ray showed no active disease. Patient was started on IV NS, ropinirole was given.  Hospitalist was asked to admit patient for further evaluation and management.   Assessment and Plan:  Hyponatremia Na 124 Patient was started on gentle hydration and Na up to 125 today Continue to monitor sodium with serial BMPs.  Recheck sodium at 1 pm today.  Urine osmolality, serum osmolality and urine sodium will be checked Pt insists on going home today; I explained to him that he is not medically ready and would be leaving against medical advice if he left today, he verbalized understanding.    Generalized weakness in the setting of above Continue fall precaution Continue PT/OT eval and treat   Influenza A Patient already completed Tamiflu Continue Mucinex, Robitussin Continue  incentive telemetry Continue symptomatic treatment   RLS Continue ropinirole home doses ordered    Insomnia  - pt reports he cannot sleep in hospital - he reports lorazepam not helping - Low-dose doxepin (3-6 mg) is effective for improving total sleep time and sleep efficiency with a safety profile comparable to placebo and does not cause hyponatremia in geriatric patients - will order a trial to see if this can help  Peripheral neuropathy Continue gabapentin   Essential hypertension Continue lisinopril   GERD Continue Protonix   Anxiety Continue Ativan per home regimen   History of prostate cancer Patient has grade 4 prostate cancer being treated with ADT via Eligard Patient was treated with external beam radiation therapy Patient follows with Dr. Annabell Howells (urology)   DVT prophylaxis: enoxaparin Code Status: Full  Family Communication: not present during rounds Disposition: anticipate home when medically stable    Consultants:   Procedures:   Antimicrobials:    Subjective: Pt insists on going home, says he can't sleep and his RLS is exacerbated here.   Objective: Vitals:   11/26/23 0300 11/26/23 0315 11/26/23 0434 11/26/23 0939  BP: 133/80  (!) 163/99 (!) 145/69  Pulse: 73  92 81  Resp:  14 20   Temp:   97.9 F (36.6 C)   TempSrc:   Oral   SpO2: 96%  97% 99%  Weight:      Height:        Intake/Output Summary (Last 24 hours) at 11/26/2023 1114 Last data filed at 11/26/2023 0948 Gross per 24 hour  Intake 623.44 ml  Output 1250 ml  Net -626.56 ml   Filed Weights   11/25/23 2011  Weight: 74.8 kg   Examination:  General exam: Appears calm and comfortable  Respiratory system: Clear to auscultation. Respiratory effort normal. Cardiovascular system: normal S1 & S2 heard. No JVD, murmurs, rubs, gallops or clicks. No pedal edema. Gastrointestinal system: Abdomen is nondistended, soft and nontender. No organomegaly or masses felt. Normal bowel sounds  heard. Central nervous system: Alert and oriented. No focal neurological deficits. Extremities: Symmetric 5 x 5 power. Skin: No rashes, lesions or ulcers. Psychiatry: Judgement and insight appear normal. Mood & affect appropriate.   Data Reviewed: I have personally reviewed following labs and imaging studies  CBC: Recent Labs  Lab 11/25/23 2038 11/26/23 0215  WBC 10.7* 9.2  NEUTROABS 6.7  --   HGB 13.8 12.8*  HCT 39.5 37.5*  MCV 87.2 88.0  PLT 274 264    Basic Metabolic Panel: Recent Labs  Lab 11/25/23 2038 11/26/23 0215  NA 124* 125*  K 4.1 3.9  CL 91* 93*  CO2 20* 22  GLUCOSE 108* 113*  BUN 31* 25*  CREATININE 1.11 1.02  CALCIUM 9.3 9.0  MG  --  1.8  PHOS  --  2.9    CBG: No results for input(s): "GLUCAP" in the last 168 hours.  No results found for this or any previous visit (from the past 240 hours).   Radiology Studies: DG Chest Portable 1 View Result Date: 11/25/2023 CLINICAL DATA:  Shortness of breath EXAM: PORTABLE CHEST 1 VIEW COMPARISON:  08/06/2023 FINDINGS: Cardiac shadow is within normal limits. Hiatal hernia is again seen. Aortic calcifications are noted. Lungs are clear bilaterally. No bony abnormality is noted. IMPRESSION: No active disease. Electronically Signed   By: Alcide Clever M.D.   On: 11/25/2023 20:50    Scheduled Meds:  dextromethorphan-guaiFENesin  1 tablet Oral BID   enoxaparin (LOVENOX) injection  40 mg Subcutaneous Q24H   gabapentin  600 mg Oral BID   lisinopril  40 mg Oral Daily   pantoprazole  40 mg Oral q morning   rOPINIRole  1.25 mg Oral BID   And   rOPINIRole  1.5 mg Oral QHS   Continuous Infusions:  sodium chloride 75 mL/hr at 11/26/23 0444     LOS: 1 day   Time spent: 52 mins  Carlisa Eble Laural Benes, MD How to contact the [facility] Attending or Consulting provider 7A - 7P or covering provider during after hours 7P -7A, for this patient?  Check the care team in [facility] and look for a) attending/consulting TRH provider listed and  b) the [facility] team listed Log into [email or web address] to find provider on call.  Locate the [facility] provider you are looking for under [facility] and page to a number that you can be directly reached. If you still have difficulty reaching the provider, please page the [facility] (Director on Call) for the Hospitalists listed on amion for assistance.  11/26/2023, 11:14 AM

## 2023-11-26 NOTE — Evaluation (Signed)
 Physical Therapy Brief Evaluation and Discharge Note Patient Details Name: Cameron Atkins MRN: [id number] DOB: [birth year] Today's Date: 11/26/2023   History of Present Illness  a 78 y.o. male with medical history significant of hypertension, GERD, RLS, peripheral neuropathy who presents to the emergency department accompanied by wife due to increased generalized weakness and nonproductive cough which has been ongoing for about 7 to 10 days.  He was diagnosed on Friday (2/21) with influenza and was prescribed with Tamiflu which he completed yesterday.  He continued to complain of increased weakness but denies nausea, vomiting, diarrhea, chest pain, shortness of breath.  Patient has had poor oral intake over the past week, so wife has been encouraging patient with fluids including Gatorade, coffee, water.  Clinical Impression   Pt tolerated Evaluation well. No concerns with DC home. Slow, labored movement, but independent, mainly fatigued due to limited sleep per patient. At baseline, lives with wife and son, is independent with all means except for driving. Patient is performing at functional baseline, no safety concerns noted throughout evaluation.  Patient is safe to DC home with appropriate services, DME, and any other needs determined by care team.  PT to sign off at this time. PT discharging pt to mobility technician and nursing staff for further mobility activities. Please reconsult acute PT services if functional changes occur.  Thank you.       PT Assessment    Assistance Needed at Discharge       Equipment Recommendations None recommended by PT  Recommendations for Other Services       Precautions/Restrictions Precautions Precautions: None Restrictions Weight Bearing Restrictions Per Provider Order: No        Mobility  Bed Mobility          Transfers Overall transfer level: Independent                 General transfer comment: from EOB     Ambulation/Gait Ambulation/Gait assistance: Independent Gait Distance (Feet): 20 Feet Assistive device: None Gait Pattern/deviations: WFL(Within Functional Limits)   General Gait Details: steady, slow gait pattern. No  LOB  Home Activity Instructions    Stairs            Modified Rankin (Stroke Patients Only)        Balance   Sitting-balance support: No upper extremity supported Sitting balance-Leahy Scale: Normal Sitting balance - Comments: sitting EOB, donned socks independently     Standing balance-Leahy Scale: Normal Standing balance comment: Multiple standing balance reactions with BUE reaching, appropriate ankle strategies.          Pertinent Vitals/Pain   Pain Assessment Pain Assessment: No/denies pain     Home Living   Living Arrangements: Spouse/significant other       Home Equipment: None        Prior Function        UE/LE Assessment               Communication   Communication Communication: No apparent difficulties     Cognition         General Comments      Exercises     Assessment/Plan    PT Problem List         PT Visit Diagnosis Unsteadiness on feet (R26.81);Muscle weakness (generalized) (M62.81)    No Skilled PT Patient is independent with all acitivity/mobility   Co-evaluation                AMPAC 6 Clicks Help  needed turning from your back to your side while in a flat bed without using bedrails?: None Help needed moving from lying on your back to sitting on the side of a flat bed without using bedrails?: None Help needed moving to and from a bed to a chair (including a wheelchair)?: None Help needed standing up from a chair using your arms (e.g., wheelchair or bedside chair)?: None Help needed to walk in hospital room?: None Help needed climbing 3-5 steps with a railing? : None 6 Click Score: 24      End of Session     Patient left: in bed;with call bell/phone within reach Nurse  Communication: Mobility status PT Visit Diagnosis: Unsteadiness on feet (R26.81);Muscle weakness (generalized) (M62.81)     Time: 1610-9604 PT Time Calculation (min) (ACUTE ONLY): 11 min  Charges:   PT Evaluation $PT Eval Low Complexity: 1 Low      Elie Goody, DPT [facility] [place] [phone] office  Nelida Meuse  11/26/2023, 11:21 AM

## 2023-11-27 DIAGNOSIS — G2581 Restless legs syndrome: Secondary | ICD-10-CM | POA: Diagnosis not present

## 2023-11-27 DIAGNOSIS — R531 Weakness: Secondary | ICD-10-CM | POA: Diagnosis not present

## 2023-11-27 DIAGNOSIS — I10 Essential (primary) hypertension: Secondary | ICD-10-CM | POA: Diagnosis not present

## 2023-11-27 DIAGNOSIS — E871 Hypo-osmolality and hyponatremia: Secondary | ICD-10-CM | POA: Diagnosis not present

## 2023-11-27 LAB — BASIC METABOLIC PANEL
Anion gap: 3 — ABNORMAL LOW (ref 5–15)
BUN: 17 mg/dL (ref 8–23)
CO2: 22 mmol/L (ref 22–32)
Calcium: 8.1 mg/dL — ABNORMAL LOW (ref 8.9–10.3)
Chloride: 102 mmol/L (ref 98–111)
Creatinine, Ser: 0.83 mg/dL (ref 0.61–1.24)
GFR, Estimated: >60 mL/min (ref >60)
Glucose, Bld: 98 mg/dL (ref 70–99)
Potassium: 3.5 mmol/L (ref 3.5–5.1)
Sodium: 127 mmol/L — ABNORMAL LOW (ref 135–145)

## 2023-11-27 MED ORDER — HYDRALAZINE HCL 20 MG/ML IJ SOLN: 10.0000 mg | INTRAMUSCULAR | Status: DC | PRN

## 2023-11-27 NOTE — Progress Notes (Signed)
   11/27/23 1653  TOC Brief Assessment  Insurance and Status Reviewed  Patient has primary care physician Yes  Home environment has been reviewed From home, spouse  Prior level of function: Independent  Prior/Current Home Services No current home services  Social Drivers of Health Review SDOH reviewed no interventions necessary  Readmission risk has been reviewed Yes  Transition of care needs transition of care needs identified, TOC will continue to follow   TOC to assess patient due to High Risk of Readmission. DC Mon

## 2023-11-27 NOTE — Plan of Care (Signed)
   Problem: Activity: Goal: Risk for activity intolerance will decrease Outcome: Progressing   Problem: Coping: Goal: Level of anxiety will decrease Outcome: Progressing

## 2023-11-27 NOTE — Progress Notes (Signed)
 PROGRESS NOTE   Cameron Atkins  BMW:[id number] DOB: November 15, 1945 DOA: 11/25/2023 PCP: Cameron Stabile, MD   Chief Complaint  Patient presents with   Shortness of Breath   Level of care: Telemetry  Brief Admission History:   78 y.o. male with medical history significant of hypertension, GERD, RLS, peripheral neuropathy who presents to the emergency department accompanied by wife due to increased generalized weakness and nonproductive cough which has been ongoing for about 7 to 10 days.  He was diagnosed on Friday (2/21) with influenza and was prescribed with Tamiflu which he completed yesterday.  He continued to complain of increased weakness but denies nausea, vomiting, diarrhea, chest pain, shortness of breath.  Patient has had poor oral intake over the past week, so wife has been encouraging patient with fluids including Gatorade, coffee, water.   In the emergency department, BP was 150/97, other vital signs are within normal range.  Workup in the ED shows normal CBC except for WBC of 10.7, BMP showed sodium 124, potassium 4.1, chloride 91, bicarb 20, blood glucose 108, BUN 31, creatinine 1.11.  Troponin x 2 was normal. Chest x-ray showed no active disease. Patient was started on IV NS, ropinirole was given.  Hospitalist was asked to admit patient for further evaluation and management.   Assessment and Plan:  Hyponatremia Na 124 on admission  Patient was started on gentle hydration and Na up to 127 today Continue to monitor sodium with serial BMPs.   Pt insists on going home; I explained to him that he is not medically ready and would be leaving against medical advice if he left today, he verbalized understanding.    Generalized weakness in the setting of above Continue fall precaution Continue PT/OT eval and treat   Influenza A Patient already completed Tamiflu Continue Mucinex, Robitussin Continue incentive telemetry Continue symptomatic treatment   RLS Continue ropinirole home  doses ordered    Insomnia  - pt reports he cannot sleep in hospital - he reports lorazepam not helping - Low-dose doxepin (3-6 mg) is effective for improving total sleep time and sleep efficiency with a safety profile comparable to placebo and does not cause hyponatremia in geriatric patients - tolerated first dose of doxepin, spoke with pharm D, lowest dose we have is 10 mg doxepin in this hospital  Peripheral neuropathy Continue gabapentin   Essential hypertension Continue lisinopril   GERD Continue Protonix   Anxiety Continue Ativan per home regimen   History of prostate cancer Patient has grade 4 prostate cancer being treated with ADT via Eligard Patient was treated with external beam radiation therapy Patient follows with Dr. Annabell Atkins (urology)  DVT prophylaxis: enoxaparin Code Status: Full  Family Communication: not present during rounds Disposition: anticipate home when medically stable    Consultants:   Procedures:   Antimicrobials:    Subjective: Pt insists on going home, says he can't sleep and his RLS is exacerbated here.   Objective: Vitals:   11/26/23 1415 11/26/23 2242 11/27/23 0611 11/27/23 0843  BP: 126/80 (!) 159/87 133/69 (!) 156/81  Pulse: 94 86 70   Resp:  18 18   Temp: 98.1 F (36.7 C) 98.1 F (36.7 C) 97.6 F (36.4 C)   TempSrc: Oral Oral Oral   SpO2: 96% 97% 96%   Weight:      Height:        Intake/Output Summary (Last 24 hours) at 11/27/2023 1212 Last data filed at 11/27/2023 0818 Gross per 24 hour  Intake 1469.7 ml  Output --  Net 1469.7 ml   Filed Weights   11/25/23 2011  Weight: 74.8 kg   Examination:  General exam: Appears calm and comfortable  Respiratory system: Clear to auscultation. Respiratory effort normal. Cardiovascular system: normal S1 & S2 heard. No JVD, murmurs, rubs, gallops or clicks. No pedal edema. Gastrointestinal system: Abdomen is nondistended, soft and nontender. No organomegaly or masses felt. Normal  bowel sounds heard. Central nervous system: Alert and oriented. No focal neurological deficits. Extremities: Symmetric 5 x 5 power. Skin: No rashes, lesions or ulcers. Psychiatry: Judgement and insight appear normal. Mood & affect appropriate.   Data Reviewed: I have personally reviewed following labs and imaging studies  CBC: Recent Labs  Lab 11/25/23 2038 11/26/23 0215  WBC 10.7* 9.2  NEUTROABS 6.7  --   HGB 13.8 12.8*  HCT 39.5 37.5*  MCV 87.2 88.0  PLT 274 264    Basic Metabolic Panel: Recent Labs  Lab 11/25/23 2038 11/26/23 0215 11/26/23 1309 11/27/23 0540  NA 124* 125* 126* 127*  K 4.1 3.9  --  3.5  CL 91* 93*  --  102  CO2 20* 22  --  22  GLUCOSE 108* 113*  --  98  BUN 31* 25*  --  17  CREATININE 1.11 1.02  --  0.83  CALCIUM 9.3 9.0  --  8.1*  MG  --  1.8  --   --   PHOS  --  2.9  --   --     CBG: No results for input(s): "GLUCAP" in the last 168 hours.  No results found for this or any previous visit (from the past 240 hours).   Radiology Studies: DG Chest Portable 1 View Result Date: 11/25/2023 CLINICAL DATA:  Shortness of breath EXAM: PORTABLE CHEST 1 VIEW COMPARISON:  08/06/2023 FINDINGS: Cardiac shadow is within normal limits. Hiatal hernia is again seen. Aortic calcifications are noted. Lungs are clear bilaterally. No bony abnormality is noted. IMPRESSION: No active disease. Electronically Signed   By: Alcide Clever M.D.   On: 11/25/2023 20:50    Scheduled Meds:  dextromethorphan-guaiFENesin  1 tablet Oral BID   enoxaparin (LOVENOX) injection  40 mg Subcutaneous Q24H   gabapentin  600 mg Oral BID   lisinopril  40 mg Oral Daily   pantoprazole  40 mg Oral q morning   psyllium  1 packet Oral Daily   rOPINIRole  1.25 mg Oral BID   And   rOPINIRole  1.5 mg Oral QHS   Continuous Infusions:  sodium chloride 75 mL/hr at 11/27/23 0607    LOS: 2 days   Time spent: 55 mins  Cameron Riddles Laural Benes, MD How to contact the [facility] Attending or Consulting  provider 7A - 7P or covering provider during after hours 7P -7A, for this patient?  Check the care team in [facility] and look for a) attending/consulting TRH provider listed and b) the [facility] team listed Log into [email or web address] to find provider on call.  Locate the [facility] provider you are looking for under [facility] and page to a number that you can be directly reached. If you still have difficulty reaching the provider, please page the [facility] (Director on Call) for the Hospitalists listed on amion for assistance.  11/27/2023, 12:12 PM

## 2023-11-27 NOTE — Plan of Care (Signed)

## 2023-11-28 ENCOUNTER — Other Ambulatory Visit: Payer: Self-pay | Admitting: *Deleted

## 2023-11-28 DIAGNOSIS — G2581 Restless legs syndrome: Secondary | ICD-10-CM | POA: Diagnosis not present

## 2023-11-28 DIAGNOSIS — E871 Hypo-osmolality and hyponatremia: Secondary | ICD-10-CM | POA: Diagnosis not present

## 2023-11-28 DIAGNOSIS — I10 Essential (primary) hypertension: Secondary | ICD-10-CM | POA: Diagnosis not present

## 2023-11-28 DIAGNOSIS — R531 Weakness: Secondary | ICD-10-CM | POA: Diagnosis not present

## 2023-11-28 LAB — BASIC METABOLIC PANEL
Anion gap: 6 (ref 5–15)
BUN: 14 mg/dL (ref 8–23)
CO2: 20 mmol/L — ABNORMAL LOW (ref 22–32)
Calcium: 8.2 mg/dL — ABNORMAL LOW (ref 8.9–10.3)
Chloride: 104 mmol/L (ref 98–111)
Creatinine, Ser: 0.8 mg/dL (ref 0.61–1.24)
GFR, Estimated: >60 mL/min (ref >60)
Glucose, Bld: 92 mg/dL (ref 70–99)
Potassium: 3.5 mmol/L (ref 3.5–5.1)
Sodium: 130 mmol/L — ABNORMAL LOW (ref 135–145)

## 2023-11-28 MED ORDER — LORAZEPAM 1 MG PO TABS: 1.0000 mg | ORAL_TABLET | Freq: Three times a day (TID) | ORAL | Status: AC | PRN

## 2023-11-28 MED ORDER — GABAPENTIN 600 MG PO TABS: 600.0000 mg | ORAL_TABLET | Freq: Two times a day (BID) | ORAL | Status: AC

## 2023-11-28 MED ORDER — ROPINIROLE HCL 0.25 MG PO TABS: 1.2500 mg | ORAL_TABLET | ORAL | Status: AC

## 2023-11-28 NOTE — Discharge Instructions (Addendum)
 PLEASE STOP TAKING HYDROCHLOROTHIAZIDE MEDICATION BECAUSE IT CAUSES LOW SODIUM LEVEL.   PLEASE HAVE YOUR SODIUM LEVEL RECHECKED IN 1 WEEK.   PLEASE REDUCE COFFEE CONSUMPTION TO NO MORE THAN 2 CUPS PER DAY    IMPORTANT INFORMATION: PAY CLOSE ATTENTION   PHYSICIAN DISCHARGE INSTRUCTIONS  Follow with Primary care provider  Benita Stabile, MD  and other consultants as instructed by your Hospitalist Physician  SEEK MEDICAL CARE OR RETURN TO EMERGENCY ROOM IF SYMPTOMS COME BACK, WORSEN OR NEW PROBLEM DEVELOPS   Please note: You were cared for by a hospitalist during your hospital stay. Every effort will be made to forward records to your primary care provider.  You can request that your primary care provider send for your hospital records if they have not received them.  Once you are discharged, your primary care physician will handle any further medical issues. Please note that NO REFILLS for any discharge medications will be authorized once you are discharged, as it is imperative that you return to your primary care physician (or establish a relationship with a primary care physician if you do not have one) for your post hospital discharge needs so that they can reassess your need for medications and monitor your lab values.  Please get a complete blood count and chemistry panel checked by your Primary MD at your next visit, and again as instructed by your Primary MD.  Get Medicines reviewed and adjusted: Please take all your medications with you for your next visit with your Primary MD  Laboratory/radiological data: Please request your Primary MD to go over all hospital tests and procedure/radiological results at the follow up, please ask your primary care provider to get all Hospital records sent to his/her office.  In some cases, they will be blood work, cultures and biopsy results pending at the time of your discharge. Please request that your primary care provider follow up on these  results.  If you are diabetic, please bring your blood sugar readings with you to your follow up appointment with primary care.    Please call and make your follow up appointments as soon as possible.    Also Note the following: If you experience worsening of your admission symptoms, develop shortness of breath, life threatening emergency, suicidal or homicidal thoughts you must seek medical attention immediately by calling 911 or calling your MD immediately  if symptoms less severe.  You must read complete instructions/literature along with all the possible adverse reactions/side effects for all the Medicines you take and that have been prescribed to you. Take any new Medicines after you have completely understood and accpet all the possible adverse reactions/side effects.   Do not drive when taking Pain medications or sleeping medications (Benzodiazepines)  Do not take more than prescribed Pain, Sleep and Anxiety Medications. It is not advisable to combine anxiety,sleep and pain medications without talking with your primary care practitioner  Special Instructions: If you have smoked or chewed Tobacco  in the last 2 yrs please stop smoking, stop any regular Alcohol  and or any Recreational drug use.  Wear Seat belts while driving.  Do not drive if taking any narcotic, mind altering or controlled substances or recreational drugs or alcohol.

## 2023-11-28 NOTE — Care Management Important Message (Signed)
 Important Message  Patient Details  Name: Cameron Atkins MRN: [id number] Date of Birth: 06/05/1946   Important Message Given:  Yes - Medicare IM     Corey Harold 11/28/2023, 10:27 AM

## 2023-11-28 NOTE — Discharge Summary (Signed)
 Physician Discharge Summary  Cameron Atkins ZOX:[id number] DOB: 06/17/1946 DOA: 11/25/2023  PCP: Benita Stabile, MD  Admit date: 11/25/2023 Discharge date: 11/28/2023  Admitted From:  HOME  Disposition: HOME   Recommendations for Outpatient Follow-up:  Follow up with PCP in 1 weeks Please obtain BMP in one week Please AVOID HCTZ  Discharge Condition: STABLE   CODE STATUS: FULL DIET: RESUME HEART HEALTHY   Brief Hospitalization Summary: Please see all hospital notes, images, labs for full details of the hospitalization. Admission provider HPI:   78 y.o. male with medical history significant of hypertension, GERD, RLS, peripheral neuropathy who presents to the emergency department accompanied by wife due to increased generalized weakness and nonproductive cough which has been ongoing for about 7 to 10 days.  He was diagnosed on Friday (2/21) with influenza and was prescribed with Tamiflu which he completed yesterday.  He continued to complain of increased weakness but denies nausea, vomiting, diarrhea, chest pain, shortness of breath.  Patient has had poor oral intake over the past week, so wife has been encouraging patient with fluids including Gatorade, coffee, water.   In the emergency department, BP was 150/97, other vital signs are within normal range.  Workup in the ED shows normal CBC except for WBC of 10.7, BMP showed sodium 124, potassium 4.1, chloride 91, bicarb 20, blood glucose 108, BUN 31, creatinine 1.11.  Troponin x 2 was normal. Chest x-ray showed no active disease. Patient was started on IV NS, ropinirole was given.  Hospitalist was asked to admit patient for further evaluation and management.  HOSPITAL COURSE BY PROBLEM LIST   Hyponatremia Na 124 on admission  Patient was started on gentle hydration and Na up to 130 today Pt insists on going home; I explained to him that he HE SHOULD STOP H.C.T.Z  and have his sodium rechecked in 1 week and he verbalized understanding.     Generalized weakness in the setting of above Continue fall precaution Continue PT/OT eval and treat - no PT follow up recommended    Influenza A Patient already completed Tamiflu prior to arrival  Continue Mucinex, Robitussin Continue incentive telemetry   RLS Continue ropinirole home doses ordered    Insomnia  - pt reports he cannot sleep in hospital - he reports lorazepam not helping - Low-dose doxepin (3-6 mg) is effective for improving total sleep time and sleep efficiency with a safety profile comparable to placebo and does not cause hyponatremia in geriatric patients - tolerated first dose of doxepin, spoke with pharm D, lowest dose we have is 10 mg doxepin in this hospital - he used this in hospital but did not prescribe at discharge -- he will follow up with his PCP    Peripheral neuropathy Continue gabapentin   Essential hypertension Continue lisinopril   GERD Continue Protonix   Anxiety Continue Ativan per home regimen   History of prostate cancer Patient has grade 4 prostate cancer being treated with ADT via Eligard Patient was treated with external beam radiation therapy Patient follows with Dr. Annabell Howells (urology)   Discharge Diagnoses:  Principal Problem:   Hyponatremia Active Problems:   GERD (gastroesophageal reflux disease)   Anxiety   Generalized weakness   Influenza A   RLS (restless legs syndrome)   Peripheral neuropathy   Essential hypertension  Discharge Instructions:  Allergies as of 11/28/2023       Reactions   Benadryl [diphenhydramine] Other (See Comments)   anxious   Cat Dander Other (See Comments)  Hctz [hydrochlorothiazide]    hyponatremia   Mepergan [meperidine-promethazine] Nausea And Vomiting   Other    Antidepressants--per patient he cannot take.   Oxycodone Itching   hyperactivity   Amoxicillin    Has patient had a PCN reaction causing immediate rash, facial/tongue/throat swelling, SOB or lightheadedness with  hypotension:No Has patient had a PCN reaction causing severe rash involving mucus membranes or skin necrosis:No Has patient had a PCN reaction that required hospitalization:No Has patient had a PCN reaction occurring within the last 10 years:Yes If all of the above answers are "NO", then may proceed with Cephalosporin use. "severe anxiety-ativan does not remedy"   Flagyl [metronidazole] Anxiety   Singulair [montelukast Sodium] Anxiety        Medication List     STOP taking these medications    hydrochlorothiazide 25 MG tablet Commonly known as: HYDRODIURIL       TAKE these medications    albuterol 108 (90 Base) MCG/ACT inhaler Commonly known as: VENTOLIN HFA Inhale 1-2 puffs into the lungs every 6 (six) hours as needed for wheezing or shortness of breath.   B-COMPLEX BALANCED PO Take 1 tablet by mouth daily.   cimetidine 400 MG tablet Commonly known as: TAGAMET Take 1 tablet (400 mg total) by mouth daily as needed.   fish oil-omega-3 fatty acids 1000 MG capsule Take 2 g by mouth daily.   Fusion Plus Caps Take 1 capsule by mouth daily.   gabapentin 600 MG tablet Commonly known as: NEURONTIN Take 1 tablet (600 mg total) by mouth in the morning and at bedtime.   hydrOXYzine 25 MG capsule Commonly known as: VISTARIL Take 25 mg by mouth daily as needed for itching.   lisinopril 40 MG tablet Commonly known as: ZESTRIL Take 40 mg by mouth daily.   LORazepam 1 MG tablet Commonly known as: ATIVAN Take 1 tablet (1 mg total) by mouth every 8 (eight) hours as needed for anxiety.   multivitamin tablet Take 1 tablet by mouth daily.   pantoprazole 40 MG tablet Commonly known as: PROTONIX TAKE 1 TABLET BY MOUTH EVERY MORNING   psyllium 95 % Pack Commonly known as: HYDROCIL/METAMUCIL Take 1 packet by mouth daily.   rOPINIRole 0.25 MG tablet Commonly known as: REQUIP Take 5-6 tablets (1.25-1.5 mg total) by mouth See admin instructions. Take 5 tablets in the  morning and 5 tablets in the afternoon, then take 6 tablets in the evening for restless legs (May dissolve tablets and take doses as needed) What changed: See the new instructions.        Follow-up Information     Benita Stabile, MD Follow up in 1 week(s).   Specialty: Internal Medicine Why: Hospital Follow Up Contact information: [address] [place] [facility] [address] [phone]                Allergies  Allergen Reactions   Benadryl [Diphenhydramine] Other (See Comments)    anxious   Cat Dander Other (See Comments)   Hctz [Hydrochlorothiazide]     hyponatremia   Mepergan [Meperidine-Promethazine] Nausea And Vomiting   Other     Antidepressants--per patient he cannot take.   Oxycodone Itching    hyperactivity   Amoxicillin     Has patient had a PCN reaction causing immediate rash, facial/tongue/throat swelling, SOB or lightheadedness with hypotension:No Has patient had a PCN reaction causing severe rash involving mucus membranes or skin necrosis:No Has patient had a PCN reaction that required hospitalization:No Has patient had a  PCN reaction occurring within the last 10 years:Yes If all of the above answers are "NO", then may proceed with Cephalosporin use. "severe anxiety-ativan does not remedy"   Flagyl [Metronidazole] Anxiety   Singulair [Montelukast Sodium] Anxiety   Allergies as of 11/28/2023       Reactions   Benadryl [diphenhydramine] Other (See Comments)   anxious   Cat Dander Other (See Comments)   Hctz [hydrochlorothiazide]    hyponatremia   Mepergan [meperidine-promethazine] Nausea And Vomiting   Other    Antidepressants--per patient he cannot take.   Oxycodone Itching   hyperactivity   Amoxicillin    Has patient had a PCN reaction causing immediate rash, facial/tongue/throat swelling, SOB or lightheadedness with hypotension:No Has patient had a PCN reaction causing severe rash involving mucus membranes or skin necrosis:No Has patient had a  PCN reaction that required hospitalization:No Has patient had a PCN reaction occurring within the last 10 years:Yes If all of the above answers are "NO", then may proceed with Cephalosporin use. "severe anxiety-ativan does not remedy"   Flagyl [metronidazole] Anxiety   Singulair [montelukast Sodium] Anxiety        Medication List     STOP taking these medications    hydrochlorothiazide 25 MG tablet Commonly known as: HYDRODIURIL       TAKE these medications    albuterol 108 (90 Base) MCG/ACT inhaler Commonly known as: VENTOLIN HFA Inhale 1-2 puffs into the lungs every 6 (six) hours as needed for wheezing or shortness of breath.   B-COMPLEX BALANCED PO Take 1 tablet by mouth daily.   cimetidine 400 MG tablet Commonly known as: TAGAMET Take 1 tablet (400 mg total) by mouth daily as needed.   fish oil-omega-3 fatty acids 1000 MG capsule Take 2 g by mouth daily.   Fusion Plus Caps Take 1 capsule by mouth daily.   gabapentin 600 MG tablet Commonly known as: NEURONTIN Take 1 tablet (600 mg total) by mouth in the morning and at bedtime.   hydrOXYzine 25 MG capsule Commonly known as: VISTARIL Take 25 mg by mouth daily as needed for itching.   lisinopril 40 MG tablet Commonly known as: ZESTRIL Take 40 mg by mouth daily.   LORazepam 1 MG tablet Commonly known as: ATIVAN Take 1 tablet (1 mg total) by mouth every 8 (eight) hours as needed for anxiety.   multivitamin tablet Take 1 tablet by mouth daily.   pantoprazole 40 MG tablet Commonly known as: PROTONIX TAKE 1 TABLET BY MOUTH EVERY MORNING   psyllium 95 % Pack Commonly known as: HYDROCIL/METAMUCIL Take 1 packet by mouth daily.   rOPINIRole 0.25 MG tablet Commonly known as: REQUIP Take 5-6 tablets (1.25-1.5 mg total) by mouth See admin instructions. Take 5 tablets in the morning and 5 tablets in the afternoon, then take 6 tablets in the evening for restless legs (May dissolve tablets and take doses as  needed) What changed: See the new instructions.        Procedures/Studies: DG Chest Portable 1 View Result Date: 11/25/2023 CLINICAL DATA:  Shortness of breath EXAM: PORTABLE CHEST 1 VIEW COMPARISON:  08/06/2023 FINDINGS: Cardiac shadow is within normal limits. Hiatal hernia is again seen. Aortic calcifications are noted. Lungs are clear bilaterally. No bony abnormality is noted. IMPRESSION: No active disease. Electronically Signed   By: Alcide Clever M.D.   On: 11/25/2023 20:50     Subjective: Pt is eager to go home today.  He says he is feeling much better.  He is  telling me he understands to NOT take any more hydrochlorothiazide.  Discharge Exam: Vitals:   11/27/23 2300 11/28/23 0514  BP: (!) 140/72 137/85  Pulse: 72 80  Resp:  18  Temp:  98.4 F (36.9 C)  SpO2:  98%   Vitals:   11/27/23 2047 11/27/23 2217 11/27/23 2300 11/28/23 0514  BP: (!) 167/89 (!) 160/79 (!) 140/72 137/85  Pulse: 84 75 72 80  Resp: 18   18  Temp: 99.2 F (37.3 C)   98.4 F (36.9 C)  TempSrc: Oral   Oral  SpO2: 98%   98%  Weight:      Height:       General: Pt is alert, awake, not in acute distress Cardiovascular: normal S1/S2 +, no rubs, no gallops Respiratory: CTA bilaterally, no wheezing, no rhonchi Abdominal: Soft, NT, ND, bowel sounds + Extremities: no edema, no cyanosis   The results of significant diagnostics from this hospitalization (including imaging, microbiology, ancillary and laboratory) are listed below for reference.     Microbiology: No results found for this or any previous visit (from the past 240 hours).   Labs: BNP (last 3 results) No results for input(s): "BNP" in the last 8760 hours. Basic Metabolic Panel: Recent Labs  Lab 11/25/23 2038 11/26/23 0215 11/26/23 1309 11/27/23 0540 11/28/23 0716  NA 124* 125* 126* 127* 130*  K 4.1 3.9  --  3.5 3.5  CL 91* 93*  --  102 104  CO2 20* 22  --  22 20*  GLUCOSE 108* 113*  --  98 92  BUN 31* 25*  --  17 14   CREATININE 1.11 1.02  --  0.83 0.80  CALCIUM 9.3 9.0  --  8.1* 8.2*  MG  --  1.8  --   --   --   PHOS  --  2.9  --   --   --    Liver Function Tests: Recent Labs  Lab 11/26/23 0215  AST 27  ALT 25  ALKPHOS 35*  BILITOT 0.8  PROT 6.0*  ALBUMIN 3.1*   No results for input(s): "LIPASE", "AMYLASE" in the last 168 hours. No results for input(s): "AMMONIA" in the last 168 hours. CBC: Recent Labs  Lab 11/25/23 2038 11/26/23 0215  WBC 10.7* 9.2  NEUTROABS 6.7  --   HGB 13.8 12.8*  HCT 39.5 37.5*  MCV 87.2 88.0  PLT 274 264   Cardiac Enzymes: No results for input(s): "CKTOTAL", "CKMB", "CKMBINDEX", "TROPONINI" in the last 168 hours. BNP: Invalid input(s): "POCBNP" CBG: No results for input(s): "GLUCAP" in the last 168 hours. D-Dimer No results for input(s): "DDIMER" in the last 72 hours. Hgb A1c No results for input(s): "HGBA1C" in the last 72 hours. Lipid Profile No results for input(s): "CHOL", "HDL", "LDLCALC", "TRIG", "CHOLHDL", "LDLDIRECT" in the last 72 hours. Thyroid function studies No results for input(s): "TSH", "T4TOTAL", "T3FREE", "THYROIDAB" in the last 72 hours.  Invalid input(s): "FREET3" Anemia work up No results for input(s): "VITAMINB12", "FOLATE", "FERRITIN", "TIBC", "IRON", "RETICCTPCT" in the last 72 hours. Urinalysis    Component Value Date/Time   COLORURINE YELLOW 07/09/2016 1647   APPEARANCEUR Clear 03/04/2017 1429   LABSPEC <1.005 (L) 07/09/2016 1647   PHURINE 7.0 07/09/2016 1647   GLUCOSEU Negative 03/04/2017 1429   HGBUR NEGATIVE 07/09/2016 1647   BILIRUBINUR Negative 03/04/2017 1429   KETONESUR NEGATIVE 07/09/2016 1647   PROTEINUR Negative 03/04/2017 1429   PROTEINUR NEGATIVE 07/09/2016 1647   UROBILINOGEN 1.0 01/18/2014 0329  NITRITE Negative 03/04/2017 1429   NITRITE NEGATIVE 07/09/2016 1647   LEUKOCYTESUR Negative 03/04/2017 1429   Sepsis Labs Recent Labs  Lab 11/25/23 2038 11/26/23 0215  WBC 10.7* 9.2    Microbiology No results found for this or any previous visit (from the past 240 hours).  Time coordinating discharge: 43 mins   SIGNED:  Standley Dakins, MD  [facility] 11/28/2023, 10:40 AM How to contact the [facility] Attending or Consulting provider 7A - 7P or covering provider during after hours 7P -7A, for this patient?  Check the care team in [facility] and look for a) attending/consulting [facility] provider listed and b) the [facility] team listed Log into [email or web address] and use [place]'s universal password to access. If you do not have the password, please contact the hospital operator. Locate the [facility] provider you are looking for under [facility] and page to a number that you can be directly reached. If you still have difficulty reaching the provider, please page the [facility] (Director on Call) for the Hospitalists listed on amion for assistance.

## 2023-11-28 NOTE — Consult Note (Signed)
 [facility] Liaison Note  11/28/2023  Cameron Atkins December 23, 1945 [id number]  Location: RN Hospital Liaison screened the patient remotely at [facility].  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  The patient was screened for readmission hospitalization with noted high risk score for unplanned readmission risk with 1 IP in 6 months.  The patient was assessed for potential Care Management service needs for post hospital transition for care coordination. Review of patient's electronic medical record reveals patient was admitted for Hyponatremia. Liaison spoke with pt's spouse [name]) introducing VBCI services and offered care management post hospital prevention readmission follow up calls (receptive). Liaison will make a referral for community nurse care coordination services and enrollment.  Plan: [facility] Liaison will continue to follow progress and disposition to asess for post hospital community care coordination/management needs.  Referral request for community care coordination: Liaison will make a referral for care management services for post hospital prevention readmission.   VBCI Care Management/Population Health does not replace or interfere with any arrangements made by the Inpatient Transition of Care team.   For questions contact:   Elliot Cousin, RN, BSN Hospital Liaison [place]   [facility], Population Health Office Hours MTWF  8:00 am-6:00 pm Direct Dial: [phone] mobile [email or web address]

## 2023-11-29 ENCOUNTER — Telehealth: Payer: Self-pay

## 2023-11-29 NOTE — Transitions of Care (Post Inpatient/ED Visit) (Signed)
 11/29/2023  Name: Cameron Atkins MRN: [id number] DOB: 05/19/1946  Today's TOC FU Call Status: Today's TOC FU Call Status:: Successful TOC FU Call Completed TOC FU Call Complete Date: 11/29/23 Patient's Name and Date of Birth confirmed.  Transition Care Management Follow-up Telephone Call Date of Discharge: 11/28/23 Dis[facility] Type of Discharge: Inpatient Admission Primary Inpatient Discharge Diagnosis:: Shortness of breath (Primary Dx); Suspected COVID-19 virus infection; History of influenza How have you been since you were released from the hospital?: Same (still weak , tired,) Any questions or concerns?: No  Items Reviewed: Did you receive and understand the discharge instructions provided?: Yes Medications obtained,verified, and reconciled?: Yes (Medications Reviewed) Any new allergies since your discharge?: No Dietary orders reviewed?: Yes Type of Diet Ordered:: Reg Heart Healthy Do you have support at home?: Yes People in Home: spouse, child(ren), adult Name of Support/Comfort Primary Source: [name] - wife amd son [employer]  Medications Reviewed Today: Medications Reviewed Today     Reviewed by Johnnette Barrios, RN (Registered Nurse) on 11/29/23 at 1213  Med List Status: <None>   Medication Order Taking? Sig Documenting Provider Last Dose Status Informant  albuterol (VENTOLIN HFA) 108 (90 Base) MCG/ACT inhaler [id number] Yes Inhale 1-2 puffs into the lungs every 6 (six) hours as needed for wheezing or shortness of breath. [provider] Taking Active Self  B Complex-C-Folic Acid (B-COMPLEX BALANCED PO) [id number] Yes Take 1 tablet by mouth daily. [provider] Taking Active Self  cimetidine (TAGAMET) 400 MG tablet [id number] Yes Take 1 tablet (400 mg total) by mouth daily as needed. Malissa Hippo, MD Taking Active Self  fish oil-omega-3 fatty acids 1000 MG capsule [id number] Yes Take 2 g by mouth daily. [provider] Taking  Active Self  gabapentin (NEURONTIN) 600 MG tablet [id number] Yes Take 1 tablet (600 mg total) by mouth in the morning and at bedtime. Johnson, Clanford L, MD Taking Active   hydrOXYzine (VISTARIL) 25 MG capsule [id number] Yes Take 25 mg by mouth daily as needed for itching. [provider] Taking Active Self  Iron-FA-B Cmp-C-Biot-Probiotic (FUSION PLUS) CAPS [id number] Yes Take 1 capsule by mouth daily. [provider] Taking Active Self  lisinopril (ZESTRIL) 40 MG tablet [id number] Yes Take 40 mg by mouth daily. [provider] Taking Active Self  LORazepam (ATIVAN) 1 MG tablet [id number] Yes Take 1 tablet (1 mg total) by mouth every 8 (eight) hours as needed for anxiety. Cleora Fleet, MD Taking Active   Multiple Vitamin (MULTIVITAMIN) tablet [id number] Yes Take 1 tablet by mouth daily. [provider] Taking Active Self  pantoprazole (PROTONIX) 40 MG tablet [id number] Yes TAKE 1 TABLET BY MOUTH EVERY MORNING  Patient taking differently: Take 40 mg by mouth every morning.   Setzer, Brand Males, NP Taking Active Self  psyllium (HYDROCIL/METAMUCIL) 95 % PACK [id number] Yes Take 1 packet by mouth daily. [provider] Taking Active Self  rOPINIRole (REQUIP) 0.25 MG tablet [id number] Yes Take 5-6 tablets (1.25-1.5 mg total) by mouth See admin instructions. Take 5 tablets in the morning and 5 tablets in the afternoon, then take 6 tablets in the evening for restless legs (May dissolve tablets and take doses as needed) Cleora Fleet, MD Taking Active            Med Note (Shakeena Kafer L   Tue Nov 29, 2023 11:53 AM)  TAKE FIVE TABLETS BY MOUTH EVERY MORNING, TAKE FIVE TABLETS BY MOUTH AT LUNCH, THEN TAKE SIX  TABLETS BY MOUTH EVERY EVENING FOR A MAX TOTAL OF 4MG  DAILY FOR RESTLESSNESS LEG,           Medication reconciliation / review completed based on most recent discharge summary and EHR medication list. Confirmed patient is taking all newly prescribed  medications as instructed (any discrepancies are noted in review section)   Patient  & Caregiver is aware of any changes to and / or  any dosage adjustments to medication regimen. Patient & Caregiver denied questions at this time and reports no barriers to medication adherence.    Changes noted  Medication List       STOP taking these medications     hydrochlorothiazide 25 MG tablet Commonly known as: HYDRODIURIL     Home Care and Equipment/Supplies: Were Home Health Services Ordered?: No Any new equipment or medical supplies ordered?: No  Functional Questionnaire: Do you need assistance with bathing/showering or dressing?: No Do you need assistance with meal preparation?: No Do you need assistance with eating?: No Do you have difficulty maintaining continence: No Do you need assistance with getting out of bed/getting out of a chair/moving?: No Do you have difficulty managing or taking your medications?: No  Follow up appointments reviewed: Specialist Hospital Follow-up appointment confirmed?: NA Do you need transportation to your follow-up appointment?: No (he drives but spouse will take him) Do you understand care options if your condition(s) worsen?: Yes-patient verbalized understanding  SDOH Interventions Today    Flowsheet Row Most Recent Value  SDOH Interventions   Food Insecurity Interventions Intervention Not Indicated  Housing Interventions Intervention Not Indicated  Transportation Interventions Intervention Not Indicated, Patient Resources (Friends/Family)  Utilities Interventions Intervention Not Indicated      Interventions Today    Flowsheet Row Most Recent Value  Chronic Disease   Chronic disease during today's visit Other  [Influenza A]  General Interventions   General Interventions Discussed/Reviewed General Interventions Discussed, General Interventions Reviewed, Doctor Visits  Doctor Visits Discussed/Reviewed Doctor Visits Reviewed, Doctor Visits  Discussed, PCP  PCP/Specialist Visits Compliance with follow-up visit  Exercise Interventions   Exercise Discussed/Reviewed Physical Activity  Physical Activity Discussed/Reviewed Physical Activity Reviewed  [Enc to Pace Activity]  Mental Health Interventions   Mental Health Discussed/Reviewed Coping Strategies  Nutrition Interventions   Nutrition Discussed/Reviewed Nutrition Reviewed, Nutrition Discussed, Decreasing salt  Pharmacy Interventions   Pharmacy Dicussed/Reviewed Pharmacy Topics Reviewed, Medications and their functions  Safety Interventions   Safety Discussed/Reviewed Safety Reviewed, Fall Risk       Benefits reviewed  Based on current information and Insurance plan -Reviewed benefits accessible to patient, including details about eligibility options for care and  available value based care options  if any areas of needs were identified.  Reviewed patient/  caregiver's ability to access and / or  ability with navigating the benefits system..Amb Referral made if indicted , refer to orders section of note for details   Reviewed goals for care Patient/ Caregiver  verbalizes understanding of instructions and care plan provided. Patient / Caregiver was encouraged to make informed decisions about their care, actively participate in managing their health condition, and implement lifestyle changes as needed to promote independence and self-management of health care. There were no reported  barriers to care.   TOC program  Patient is at high risk for readmission and/or has history of  high utilization  Discussed VBCI  TOC program and weekly calls to patient to assess condition/status, medication management  and provide support/education as indicated . Patient/ Caregiver voiced understanding  and declined enrollment in the 30-day [facility] Program.   He is recovering slowly from Influenza A and Hyponatremia. He competed a course of Tamiflu and Prednisone previously He is slowly increasing fluids  and PO intake . He is getting back to his baseline   The patient has been provided with contact information for the care management team and has been advised to call with any health-related questions or concerns. Follow up as indicated with Care Team , or sooner should any new problems arise.    Susa Loffler , BSN, RN [facility]   VBCI-Population Health RN Care Manager Direct Dial [phone]  Fax: [phone] Website: [email or web address]

## 2023-12-01 ENCOUNTER — Other Ambulatory Visit: Payer: Self-pay

## 2023-12-02 ENCOUNTER — Other Ambulatory Visit: Payer: Self-pay

## 2023-12-02 ENCOUNTER — Telehealth: Payer: Self-pay

## 2023-12-02 NOTE — Transitions of Care (Post Inpatient/ED Visit) (Signed)
   12/02/2023  Name: [name] MRN: [id number] DOB: 06-Jul-1946  Today's TOC FU Call Status:   Call placed to patient at his request for 1 additional follow-up call s/p discharge     Left  a HIPAA compliant phone message message for patient including VBCI CM contact information with request for a call back in regards to recent hospital discharge and TOC  status review .   The patient has previously been  provided with contact information for the [facility] care management team and has been advised to call with any health related questions or concerns.   During Initial call Patient was encouraged to Contact PCP with any changes in baseline or  medication regimen,  changes in health status  /  well-being, safety concerns, including falls any questions or concerns regarding ongoing medical care, any difficulty obtaining or picking up prescriptions, any changes or worsening in condition- including  symptoms not relieved  with interventions    Follow Up Plan: No further outreach attempts will be made at this time. We have been unable to contact the patient.  The [facility] Case Management Team is available to follow up with the patient after provider conversation with the patient regarding recommendation for care management engagement and subsequent re-referral to the case management team.The VBCI CM team can be reached by calling [phone].  Susa Loffler , BSN, RN [facility]   VBCI-Population Health RN Care Manager Direct Dial [phone]  Fax: [phone] Website: [email or web address]

## 2023-12-06 DIAGNOSIS — Z8709 Personal history of other diseases of the respiratory system: Secondary | ICD-10-CM | POA: Diagnosis not present

## 2023-12-06 DIAGNOSIS — R601 Generalized edema: Secondary | ICD-10-CM | POA: Diagnosis not present

## 2023-12-06 DIAGNOSIS — F411 Generalized anxiety disorder: Secondary | ICD-10-CM | POA: Diagnosis not present

## 2023-12-06 DIAGNOSIS — Z79899 Other long term (current) drug therapy: Secondary | ICD-10-CM | POA: Diagnosis not present

## 2023-12-06 DIAGNOSIS — Z8546 Personal history of malignant neoplasm of prostate: Secondary | ICD-10-CM | POA: Diagnosis not present

## 2023-12-06 DIAGNOSIS — E871 Hypo-osmolality and hyponatremia: Secondary | ICD-10-CM | POA: Diagnosis not present

## 2023-12-06 DIAGNOSIS — I10 Essential (primary) hypertension: Secondary | ICD-10-CM | POA: Diagnosis not present

## 2023-12-06 DIAGNOSIS — R600 Localized edema: Secondary | ICD-10-CM | POA: Diagnosis not present

## 2023-12-13 DIAGNOSIS — M79676 Pain in unspecified toe(s): Secondary | ICD-10-CM | POA: Diagnosis not present

## 2023-12-13 DIAGNOSIS — B351 Tinea unguium: Secondary | ICD-10-CM | POA: Diagnosis not present

## 2023-12-22 DIAGNOSIS — I10 Essential (primary) hypertension: Secondary | ICD-10-CM | POA: Diagnosis not present

## 2023-12-22 DIAGNOSIS — D509 Iron deficiency anemia, unspecified: Secondary | ICD-10-CM | POA: Diagnosis not present

## 2023-12-22 DIAGNOSIS — R7303 Prediabetes: Secondary | ICD-10-CM | POA: Diagnosis not present

## 2023-12-29 DIAGNOSIS — M25572 Pain in left ankle and joints of left foot: Secondary | ICD-10-CM | POA: Diagnosis not present

## 2023-12-29 DIAGNOSIS — E875 Hyperkalemia: Secondary | ICD-10-CM | POA: Diagnosis not present

## 2023-12-29 DIAGNOSIS — R944 Abnormal results of kidney function studies: Secondary | ICD-10-CM | POA: Diagnosis not present

## 2023-12-29 DIAGNOSIS — E669 Obesity, unspecified: Secondary | ICD-10-CM | POA: Diagnosis not present

## 2023-12-29 DIAGNOSIS — Z8546 Personal history of malignant neoplasm of prostate: Secondary | ICD-10-CM | POA: Diagnosis not present

## 2023-12-29 DIAGNOSIS — M25571 Pain in right ankle and joints of right foot: Secondary | ICD-10-CM | POA: Diagnosis not present

## 2023-12-29 DIAGNOSIS — E871 Hypo-osmolality and hyponatremia: Secondary | ICD-10-CM | POA: Diagnosis not present

## 2023-12-29 DIAGNOSIS — M810 Age-related osteoporosis without current pathological fracture: Secondary | ICD-10-CM | POA: Diagnosis not present

## 2023-12-29 DIAGNOSIS — I10 Essential (primary) hypertension: Secondary | ICD-10-CM | POA: Diagnosis not present

## 2023-12-29 DIAGNOSIS — D509 Iron deficiency anemia, unspecified: Secondary | ICD-10-CM | POA: Diagnosis not present

## 2023-12-29 DIAGNOSIS — K219 Gastro-esophageal reflux disease without esophagitis: Secondary | ICD-10-CM | POA: Diagnosis not present

## 2023-12-29 DIAGNOSIS — R601 Generalized edema: Secondary | ICD-10-CM | POA: Diagnosis not present

## 2024-01-05 DIAGNOSIS — S80861A Insect bite (nonvenomous), right lower leg, initial encounter: Secondary | ICD-10-CM | POA: Diagnosis not present

## 2024-01-05 DIAGNOSIS — S40862A Insect bite (nonvenomous) of left upper arm, initial encounter: Secondary | ICD-10-CM | POA: Diagnosis not present

## 2024-01-05 DIAGNOSIS — S30861A Insect bite (nonvenomous) of abdominal wall, initial encounter: Secondary | ICD-10-CM | POA: Diagnosis not present

## 2024-01-05 DIAGNOSIS — W57XXXA Bitten or stung by nonvenomous insect and other nonvenomous arthropods, initial encounter: Secondary | ICD-10-CM | POA: Diagnosis not present

## 2024-01-05 DIAGNOSIS — S40861A Insect bite (nonvenomous) of right upper arm, initial encounter: Secondary | ICD-10-CM | POA: Diagnosis not present

## 2024-01-11 DIAGNOSIS — M170 Bilateral primary osteoarthritis of knee: Secondary | ICD-10-CM | POA: Diagnosis not present

## 2024-01-18 DIAGNOSIS — E871 Hypo-osmolality and hyponatremia: Secondary | ICD-10-CM | POA: Diagnosis not present

## 2024-01-25 DIAGNOSIS — R601 Generalized edema: Secondary | ICD-10-CM | POA: Diagnosis not present

## 2024-01-25 DIAGNOSIS — I10 Essential (primary) hypertension: Secondary | ICD-10-CM | POA: Diagnosis not present

## 2024-01-25 DIAGNOSIS — W57XXXA Bitten or stung by nonvenomous insect and other nonvenomous arthropods, initial encounter: Secondary | ICD-10-CM | POA: Diagnosis not present

## 2024-01-25 DIAGNOSIS — E871 Hypo-osmolality and hyponatremia: Secondary | ICD-10-CM | POA: Diagnosis not present

## 2024-01-25 DIAGNOSIS — J069 Acute upper respiratory infection, unspecified: Secondary | ICD-10-CM | POA: Diagnosis not present

## 2024-01-25 DIAGNOSIS — E875 Hyperkalemia: Secondary | ICD-10-CM | POA: Diagnosis not present

## 2024-01-25 DIAGNOSIS — R944 Abnormal results of kidney function studies: Secondary | ICD-10-CM | POA: Diagnosis not present

## 2024-01-30 DIAGNOSIS — C61 Malignant neoplasm of prostate: Secondary | ICD-10-CM | POA: Diagnosis not present

## 2024-02-06 DIAGNOSIS — R3915 Urgency of urination: Secondary | ICD-10-CM | POA: Diagnosis not present

## 2024-02-06 DIAGNOSIS — C61 Malignant neoplasm of prostate: Secondary | ICD-10-CM | POA: Diagnosis not present

## 2024-02-06 DIAGNOSIS — N401 Enlarged prostate with lower urinary tract symptoms: Secondary | ICD-10-CM | POA: Diagnosis not present

## 2024-02-09 DIAGNOSIS — B001 Herpesviral vesicular dermatitis: Secondary | ICD-10-CM | POA: Diagnosis not present

## 2024-02-13 DIAGNOSIS — M216X1 Other acquired deformities of right foot: Secondary | ICD-10-CM | POA: Diagnosis not present

## 2024-02-13 DIAGNOSIS — M25571 Pain in right ankle and joints of right foot: Secondary | ICD-10-CM | POA: Diagnosis not present

## 2024-02-13 DIAGNOSIS — M76821 Posterior tibial tendinitis, right leg: Secondary | ICD-10-CM | POA: Diagnosis not present

## 2024-02-21 DIAGNOSIS — M79674 Pain in right toe(s): Secondary | ICD-10-CM | POA: Diagnosis not present

## 2024-02-21 DIAGNOSIS — M79675 Pain in left toe(s): Secondary | ICD-10-CM | POA: Diagnosis not present

## 2024-02-21 DIAGNOSIS — B351 Tinea unguium: Secondary | ICD-10-CM | POA: Diagnosis not present

## 2024-02-23 ENCOUNTER — Encounter: Payer: Self-pay | Admitting: Emergency Medicine

## 2024-02-23 ENCOUNTER — Ambulatory Visit
Admission: EM | Admit: 2024-02-23 | Discharge: 2024-02-23 | Disposition: A | Discharge status: Home / Self Care | Attending: Nurse Practitioner | Admitting: Nurse Practitioner

## 2024-02-23 DIAGNOSIS — S61452A Open bite of left hand, initial encounter: Secondary | ICD-10-CM | POA: Diagnosis not present

## 2024-02-23 DIAGNOSIS — W540XXA Bitten by dog, initial encounter: Secondary | ICD-10-CM | POA: Diagnosis not present

## 2024-02-23 MED ORDER — DOXYCYCLINE HYCLATE 100 MG PO TABS: 100.0000 mg | ORAL_TABLET | Freq: Two times a day (BID) | ORAL | 0 refills | Status: AC

## 2024-02-23 NOTE — Discharge Instructions (Addendum)
 Leave the dressing in place until tomorrow.   Keep the area covered when you are out in public.  Leave the area open to air when you are home, avoid close contact with your dog. You may take over-the-counter Tylenol  as needed for pain, fever, or general discomfort. Cleanse the area daily with warm soap and water .  Change her dressings daily. Monitor for signs of infection.  Follow-up immediately if you develop increased redness, swelling, or foul-smelling drainage from the dog bite. Follow-up as needed.

## 2024-02-23 NOTE — ED Provider Notes (Signed)
 [facility]    CSN: [id number] Arrival date & time: 02/23/24  1931      History   Chief Complaint No chief complaint on file.   HPI Cameron Atkins is a 78 y.o. male.   The history is provided by the patient.   Patient presents for complaints of a dog bite to the backside of his left hand.  Patient states he was bitten by his wife's dog.  States that the dog's vaccines are up-to-date.  Patient also reports that his tetanus shot is up-to-date.  He denies numbness, tingling, pain, or use of blood thinning medications.  Past Medical History:  Diagnosis Date   Anxiety    Arthritis    Bladder stone    Depression    Diverticulosis of colon    GERD (gastroesophageal reflux disease)    H/O hiatal hernia    Helicobacter pylori gastritis    History of colonoscopy with polypectomy    TUBULAR ADENOMA-- 2012   History of esophageal dilatation    STRICTURE--  LAST DONE 03/2013   Hypertension    for 5 yrs   IDA (iron deficiency anemia) 11/22/2018   Left ureteral calculus    Osteoporosis    Restless legs syndrome    Wears glasses     Patient Active Problem List   Diagnosis Date Noted   Generalized weakness 11/26/2023   Influenza A 11/26/2023   RLS (restless legs syndrome) 11/26/2023   Peripheral neuropathy 11/26/2023   Essential hypertension 11/26/2023   Hyponatremia 11/25/2023   Genetic testing 02/19/2022   Malignant neoplasm of prostate (HCC) 02/02/2022   Abdominal pain, left lower quadrant 10/15/2021   Benzodiazepine dependence (HCC) 10/18/2018   High risk medication use 10/18/2018   Esophageal stricture 08/04/2016   History of colonic polyps 08/04/2016   GERD (gastroesophageal reflux disease) 06/26/2013   Osteoporosis 06/26/2013   Anxiety 06/26/2013    Past Surgical History:  Procedure Laterality Date   BIOPSY  09/10/2016   Procedure: BIOPSY;  Surgeon: Ruby Corporal, MD;  [facility];  Service: Endoscopy;;  rectal polyp   COLONOSCOPY  WITH PROPOFOL  N/A 09/10/2016   Procedure: COLONOSCOPY WITH PROPOFOL ;  Surgeon: Ruby Corporal, MD;  [facility];  Service: Endoscopy;  Laterality: N/A;   COLONOSCOPY WITH PROPOFOL  N/A 11/25/2021   Procedure: COLONOSCOPY WITH PROPOFOL ;  Surgeon: Ruby Corporal, MD;  [facility];  Service: Endoscopy;  Laterality: N/A;  1:15  ASA 1   CYSTO/  URETEROSCOPIC LITHOTRIPSY/ STONE EXTRACTION  1995   CYSTOSCOPY W/ RETROGRADES Right 01/15/2014   Procedure: CYSTOSCOPY WITH RIGHT RETROGRADE PYELOGRAM;  Surgeon: Homero Luster, MD;  Location[facility];  Service: Urology;  Laterality: Right;   CYSTOSCOPY WITH RETROGRADE PYELOGRAM, URETEROSCOPY AND STENT PLACEMENT Left 01/15/2014   Procedure: CYSTOSCOPY , LEFT RETROGRADE PYELOGRAM, LEFT URETEROSCOPY, BASKET STONE EXTRACTION;  Surgeon: Homero Luster, MD;  Location[facility];  Service: Urology;  Laterality: Left;   ESOPHAGEAL DILATION  03/21/2013   Procedure: ESOPHAGEAL DILATION;  Surgeon: Alyce Jubilee, MD;  [facility];  Service: Endoscopy;;   ESOPHAGEAL DILATION N/A 09/10/2016   Procedure: ESOPHAGEAL DILATION;  Surgeon: Ruby Corporal, MD;  [facility];  Service: Endoscopy;  Laterality: N/A;   ESOPHAGOGASTRODUODENOSCOPY N/A 03/21/2013   Procedure: ESOPHAGOGASTRODUODENOSCOPY (EGD);  Surgeon: Alyce Jubilee, MD;  [facility];  Service: Endoscopy;  Laterality: N/A;   ESOPHAGOGASTRODUODENOSCOPY (EGD) WITH ESOPHAGEAL DILATION N/A 04/26/2013  Procedure: ESOPHAGOGASTRODUODENOSCOPY (EGD) WITH ESOPHAGEAL DILATION;  Surgeon: Ruby Corporal, MD;  [facility];  Service: Endoscopy;  Laterality: N/A;  130   ESOPHAGOGASTRODUODENOSCOPY (EGD) WITH PROPOFOL  N/A 09/10/2016   Procedure: ESOPHAGOGASTRODUODENOSCOPY (EGD) WITH PROPOFOL ;  Surgeon: Ruby Corporal, MD;  [facility];  Service: Endoscopy;  Laterality: N/A;  10:20   EXTRACORPOREAL SHOCK WAVE LITHOTRIPSY  X2   YRS  AGO   GOLD SEED IMPLANT N/A 04/06/2022   Procedure: GOLD SEED IMPLANT;  Surgeon: Adelbert Homans, MD;  Location: [facility];  Service: Urology;  Laterality: N/A;   HOLMIUM LASER APPLICATION Left 01/15/2014   Procedure: HOLMIUM LASER APPLICATION, LEFT URETER;  Surgeon: Homero Luster, MD;  Location: [facility];  Service: Urology;  Laterality: Left;   POLYPECTOMY  11/25/2021   Procedure: POLYPECTOMY INTESTINAL;  Surgeon: Ruby Corporal, MD;  [facility];  Service: Endoscopy;;   SHOULDER ARTHROSCOPY W/ SUBACROMIAL DECOMPRESSION AND DISTAL CLAVICLE EXCISION Left 2000   SPACE OAR INSTILLATION N/A 04/06/2022   Procedure: SPACE OAR INSTILLATION;  Surgeon: Adelbert Homans, MD;  Location: [facility];  Service: Urology;  Laterality: N/A;       Home Medications    Prior to Admission medications   Medication Sig Start Date End Date Taking? Authorizing Provider  albuterol  (VENTOLIN  HFA) 108 (90 Base) MCG/ACT inhaler Inhale 1-2 puffs into the lungs every 6 (six) hours as needed for wheezing or shortness of breath.    [provider]  B Complex-C-Folic Acid (B-COMPLEX BALANCED PO) Take 1 tablet by mouth daily.    [provider]  cimetidine  (TAGAMET ) 400 MG tablet Take 1 tablet (400 mg total) by mouth daily as needed. 06/26/13   Rehman, Mathews Solomons, MD  fish oil-omega-3 fatty acids 1000 MG capsule Take 2 g by mouth daily.    [provider]  gabapentin  (NEURONTIN ) 600 MG tablet Take 1 tablet (600 mg total) by mouth in the morning and at bedtime. 11/28/23   Johnson, Clanford L, MD  hydrOXYzine  (VISTARIL ) 25 MG capsule Take 25 mg by mouth daily as needed for itching. 10/27/21   [provider]  Iron-FA-B Cmp-C-Biot-Probiotic (FUSION PLUS) CAPS Take 1 capsule by mouth daily. 09/22/21   [provider]  lisinopril  (ZESTRIL ) 40 MG tablet Take 40 mg by mouth daily. 08/14/20   [provider]   LORazepam  (ATIVAN ) 1 MG tablet Take 1 tablet (1 mg total) by mouth every 8 (eight) hours as needed for anxiety. 11/28/23   Rayfield Cairo, MD  Multiple Vitamin (MULTIVITAMIN) tablet Take 1 tablet by mouth daily.    [provider]  pantoprazole  (PROTONIX ) 40 MG tablet TAKE 1 TABLET BY MOUTH EVERY MORNING Patient taking differently: Take 40 mg by mouth every morning. 12/04/18   Setzer, Terri L, NP  psyllium (HYDROCIL/METAMUCIL) 95 % PACK Take 1 packet by mouth daily.    [provider]  rOPINIRole  (REQUIP ) 0.25 MG tablet Take 5-6 tablets (1.25-1.5 mg total) by mouth See admin instructions. Take 5 tablets in the morning and 5 tablets in the afternoon, then take 6 tablets in the evening for restless legs (May dissolve tablets and take doses as needed) 11/28/23   Rayfield Cairo, MD    Family History Family History  Problem Relation Age of Onset   Anxiety disorder Father    Breast cancer Sister 32   Breast cancer Maternal Aunt    Breast cancer Maternal Aunt  Breast cancer Maternal Aunt    Breast cancer Paternal Grandmother    Anxiety disorder Daughter    Anxiety disorder Son     Social History Social History   Tobacco Use   Smoking status: Former    Current packs/day: 0.00    Average packs/day: 0.3 packs/day for 2.0 years (0.5 ttl pk-yrs)    Types: Cigarettes    Start date: 01/15/1967    Quit date: 01/14/1969    Years since quitting: 55.1   Smokeless tobacco: Former    Types: Chew    Quit date: 01/15/1999  Vaping Use   Vaping status: Never Used  Substance Use Topics   Alcohol use: Not Currently   Drug use: No     Allergies   Benadryl  [diphenhydramine ], Cat dander, Hctz [hydrochlorothiazide ], Mepergan [meperidine -promethazine ], Other, Oxycodone , Amoxicillin, Flagyl  [metronidazole ], and Singulair [montelukast sodium]   Review of Systems Review of Systems Per HPI  Physical Exam Triage Vital Signs ED Triage Vitals  Encounter Vitals Group     BP  02/23/24 1938 (!) 155/102     Systolic BP Percentile --      Diastolic BP Percentile --      Pulse Rate 02/23/24 1938 90     Resp 02/23/24 1938 18     Temp 02/23/24 1938 98.4 F (36.9 C)     Temp Source 02/23/24 1938 Oral     SpO2 02/23/24 1938 93 %     Weight 02/23/24 1937 173 lb 4.8 oz (78.6 kg)     Height --      Head Circumference --      Peak Flow --      Pain Score 02/23/24 1939 0     Pain Loc --      Pain Education --      Exclude from Growth Chart --    No data found.  Updated Vital Signs BP (!) 155/102 (BP Location: Right Arm)   Pulse 90   Temp 98.4 F (36.9 C) (Oral)   Resp 18   Wt 173 lb 4.8 oz (78.6 kg)   SpO2 93%   BMI 28.40 kg/m   Visual Acuity Right Eye Distance:   Left Eye Distance:   Bilateral Distance:    Right Eye Near:   Left Eye Near:    Bilateral Near:     Physical Exam Vitals and nursing note reviewed.  Constitutional:      General: He is not in acute distress.    Appearance: Normal appearance.  HENT:     Head: Normocephalic.  Eyes:     Extraocular Movements: Extraocular movements intact.     Pupils: Pupils are equal, round, and reactive to light.  Pulmonary:     Effort: Pulmonary effort is normal.  Skin:    General: Skin is warm and dry.     Comments: Dog bite noted to dorsal aspect of the left hand.  Bleeding is controlled at this time.  There is no oozing or drainage present.  Neurological:     General: No focal deficit present.     Mental Status: He is alert and oriented to person, place, and time.  Psychiatric:        Mood and Affect: Mood normal.        Behavior: Behavior normal.      UC Treatments / Results  Labs (all labs ordered are listed, but only abnormal results are displayed) Labs Reviewed - No data to display  EKG   Radiology No results found.  Procedures Procedures (including critical care time)  Medications Ordered in UC Medications - No data to display  Initial Impression / Assessment and Plan /  UC Course  I have reviewed the triage vital signs and the nursing notes.  Pertinent labs & imaging results that were available during my care of the patient were reviewed by me and considered in my medical decision making (see chart for details).  Patient with dog bite to the dorsal aspect of the left hand.  The dog's rabies vaccines and patient's tetanus status are up-to-date.  Will treat empirically with doxycycline  100 mg twice daily for the next 7 days.  Supportive care recommendations were provided and discussed with the patient to include keeping the area clean and dry, over-the-counter analgesics, and monitoring for signs of infection.  Patient was in agreement with this plan of care and verbalizes understanding.  All questions were answered.  Patient stable for discharge.  Final Clinical Impressions(s) / UC Diagnoses   Final diagnoses:  None   Discharge Instructions   None    ED Prescriptions   None    PDMP not reviewed this encounter.   Hardy Lia, NP 02/23/24 [phone]

## 2024-02-23 NOTE — ED Triage Notes (Signed)
 Dog bite to left hand today.  States dog is up to date on shots

## 2024-03-12 DIAGNOSIS — M1711 Unilateral primary osteoarthritis, right knee: Secondary | ICD-10-CM | POA: Diagnosis not present

## 2024-03-12 DIAGNOSIS — M25571 Pain in right ankle and joints of right foot: Secondary | ICD-10-CM | POA: Diagnosis not present

## 2024-04-03 DIAGNOSIS — H353112 Nonexudative age-related macular degeneration, right eye, intermediate dry stage: Secondary | ICD-10-CM | POA: Diagnosis not present

## 2024-04-09 DIAGNOSIS — M25571 Pain in right ankle and joints of right foot: Secondary | ICD-10-CM | POA: Diagnosis not present

## 2024-04-09 DIAGNOSIS — M76821 Posterior tibial tendinitis, right leg: Secondary | ICD-10-CM | POA: Diagnosis not present

## 2024-04-09 DIAGNOSIS — M25562 Pain in left knee: Secondary | ICD-10-CM | POA: Diagnosis not present

## 2024-04-09 DIAGNOSIS — M1711 Unilateral primary osteoarthritis, right knee: Secondary | ICD-10-CM | POA: Diagnosis not present

## 2024-04-12 DIAGNOSIS — Z789 Other specified health status: Secondary | ICD-10-CM | POA: Diagnosis not present

## 2024-04-12 DIAGNOSIS — D225 Melanocytic nevi of trunk: Secondary | ICD-10-CM | POA: Diagnosis not present

## 2024-04-12 DIAGNOSIS — L2989 Other pruritus: Secondary | ICD-10-CM | POA: Diagnosis not present

## 2024-04-12 DIAGNOSIS — L821 Other seborrheic keratosis: Secondary | ICD-10-CM | POA: Diagnosis not present

## 2024-04-12 DIAGNOSIS — L814 Other melanin hyperpigmentation: Secondary | ICD-10-CM | POA: Diagnosis not present

## 2024-04-12 DIAGNOSIS — L820 Inflamed seborrheic keratosis: Secondary | ICD-10-CM | POA: Diagnosis not present

## 2024-04-12 DIAGNOSIS — L538 Other specified erythematous conditions: Secondary | ICD-10-CM | POA: Diagnosis not present

## 2024-04-23 DIAGNOSIS — R3915 Urgency of urination: Secondary | ICD-10-CM | POA: Diagnosis not present

## 2024-04-23 DIAGNOSIS — N5201 Erectile dysfunction due to arterial insufficiency: Secondary | ICD-10-CM | POA: Diagnosis not present

## 2024-04-23 DIAGNOSIS — N200 Calculus of kidney: Secondary | ICD-10-CM | POA: Diagnosis not present

## 2024-05-01 DIAGNOSIS — M79675 Pain in left toe(s): Secondary | ICD-10-CM | POA: Diagnosis not present

## 2024-05-01 DIAGNOSIS — B351 Tinea unguium: Secondary | ICD-10-CM | POA: Diagnosis not present

## 2024-05-01 DIAGNOSIS — M79674 Pain in right toe(s): Secondary | ICD-10-CM | POA: Diagnosis not present

## 2024-05-08 DIAGNOSIS — C61 Malignant neoplasm of prostate: Secondary | ICD-10-CM | POA: Diagnosis not present

## 2024-05-14 DIAGNOSIS — M170 Bilateral primary osteoarthritis of knee: Secondary | ICD-10-CM | POA: Diagnosis not present

## 2024-05-16 DIAGNOSIS — E559 Vitamin D deficiency, unspecified: Secondary | ICD-10-CM | POA: Diagnosis not present

## 2024-05-16 DIAGNOSIS — R5383 Other fatigue: Secondary | ICD-10-CM | POA: Diagnosis not present

## 2024-05-16 DIAGNOSIS — M810 Age-related osteoporosis without current pathological fracture: Secondary | ICD-10-CM | POA: Diagnosis not present

## 2024-05-21 DIAGNOSIS — M170 Bilateral primary osteoarthritis of knee: Secondary | ICD-10-CM | POA: Diagnosis not present

## 2024-05-22 ENCOUNTER — Telehealth: Payer: Self-pay

## 2024-05-22 ENCOUNTER — Other Ambulatory Visit: Payer: Self-pay

## 2024-05-22 NOTE — Telephone Encounter (Signed)
 Auth Submission: NO AUTH NEEDED Site of care: Site of care: [facility] Payer: medicare a/b, [employer] [place] supp Medication & CPT/J Code(s) submitted: Reclast  (Zolendronic acid) J3489 Diagnosis Code:  Route of submission (phone, fax, portal): portal Phone # Fax # Auth type: Buy/Bill HB Units/visits requested: 5mg  x 1 dose Reference number:  Approval from: 05/22/24 to 05/22/25

## 2024-05-24 ENCOUNTER — Encounter: Attending: Sports Medicine | Admitting: Internal Medicine

## 2024-05-24 VITALS — BP 143/80 | HR 67 | Temp 97.6°F | Resp 16

## 2024-05-24 DIAGNOSIS — M810 Age-related osteoporosis without current pathological fracture: Secondary | ICD-10-CM | POA: Insufficient documentation

## 2024-05-24 MED ORDER — ZOLEDRONIC ACID 5 MG/100ML IV SOLN
5.0000 mg | Freq: Once | INTRAVENOUS | Status: AC
  Administered 2024-05-24: 5 mg via INTRAVENOUS

## 2024-05-24 NOTE — Progress Notes (Signed)
 Diagnosis: Osteoporosis  Provider:  Orpha Stabs MD  Procedure: IV Infusion  IV Type: Peripheral, IV Location: L Antecubital  Reclast  (Zolendronic Acid), Dose: 5 mg  Infusion Start Time: 1409  Infusion Stop Time: 1437  Post Infusion IV Care: Observation period completed  Discharge: Condition: Good, Destination: Home . AVS Provided  Performed by:  Blanca Selinda SAUNDERS, LPN

## 2024-06-05 DIAGNOSIS — M170 Bilateral primary osteoarthritis of knee: Secondary | ICD-10-CM | POA: Diagnosis not present

## 2024-06-11 DIAGNOSIS — M170 Bilateral primary osteoarthritis of knee: Secondary | ICD-10-CM | POA: Diagnosis not present

## 2024-06-18 DIAGNOSIS — M170 Bilateral primary osteoarthritis of knee: Secondary | ICD-10-CM | POA: Diagnosis not present

## 2024-07-10 DIAGNOSIS — M545 Low back pain, unspecified: Secondary | ICD-10-CM | POA: Diagnosis not present

## 2024-07-10 DIAGNOSIS — M549 Dorsalgia, unspecified: Secondary | ICD-10-CM | POA: Diagnosis not present

## 2024-07-11 ENCOUNTER — Encounter (INDEPENDENT_AMBULATORY_CARE_PROVIDER_SITE_OTHER): Payer: Self-pay | Admitting: Gastroenterology

## 2024-07-16 DIAGNOSIS — Z23 Encounter for immunization: Secondary | ICD-10-CM | POA: Diagnosis not present

## 2024-07-24 DIAGNOSIS — M79674 Pain in right toe(s): Secondary | ICD-10-CM | POA: Diagnosis not present

## 2024-07-24 DIAGNOSIS — B351 Tinea unguium: Secondary | ICD-10-CM | POA: Diagnosis not present

## 2024-07-24 DIAGNOSIS — M79675 Pain in left toe(s): Secondary | ICD-10-CM | POA: Diagnosis not present

## 2024-07-25 DIAGNOSIS — M549 Dorsalgia, unspecified: Secondary | ICD-10-CM | POA: Diagnosis not present

## 2024-07-26 DIAGNOSIS — D509 Iron deficiency anemia, unspecified: Secondary | ICD-10-CM | POA: Diagnosis not present

## 2024-07-26 DIAGNOSIS — R7303 Prediabetes: Secondary | ICD-10-CM | POA: Diagnosis not present

## 2024-07-26 DIAGNOSIS — I10 Essential (primary) hypertension: Secondary | ICD-10-CM | POA: Diagnosis not present

## 2024-07-27 LAB — LAB REPORT - SCANNED
A1c: 5.8
Albumin, Urine POC: 11.3
Creatinine, POC: 28.8 mg/dL
EGFR: 69
Microalb Creat Ratio: 39

## 2024-07-30 DIAGNOSIS — C61 Malignant neoplasm of prostate: Secondary | ICD-10-CM | POA: Diagnosis not present

## 2024-07-30 DIAGNOSIS — M549 Dorsalgia, unspecified: Secondary | ICD-10-CM | POA: Diagnosis not present

## 2024-08-02 DIAGNOSIS — R944 Abnormal results of kidney function studies: Secondary | ICD-10-CM | POA: Diagnosis not present

## 2024-08-02 DIAGNOSIS — E875 Hyperkalemia: Secondary | ICD-10-CM | POA: Diagnosis not present

## 2024-08-02 DIAGNOSIS — Z0001 Encounter for general adult medical examination with abnormal findings: Secondary | ICD-10-CM | POA: Diagnosis not present

## 2024-08-02 DIAGNOSIS — G2581 Restless legs syndrome: Secondary | ICD-10-CM | POA: Diagnosis not present

## 2024-08-02 DIAGNOSIS — R808 Other proteinuria: Secondary | ICD-10-CM | POA: Diagnosis not present

## 2024-08-02 DIAGNOSIS — Z0000 Encounter for general adult medical examination without abnormal findings: Secondary | ICD-10-CM | POA: Diagnosis not present

## 2024-08-02 DIAGNOSIS — M810 Age-related osteoporosis without current pathological fracture: Secondary | ICD-10-CM | POA: Diagnosis not present

## 2024-08-02 DIAGNOSIS — D509 Iron deficiency anemia, unspecified: Secondary | ICD-10-CM | POA: Diagnosis not present

## 2024-08-02 DIAGNOSIS — E871 Hypo-osmolality and hyponatremia: Secondary | ICD-10-CM | POA: Diagnosis not present

## 2024-08-02 DIAGNOSIS — M170 Bilateral primary osteoarthritis of knee: Secondary | ICD-10-CM | POA: Diagnosis not present

## 2024-08-02 DIAGNOSIS — R601 Generalized edema: Secondary | ICD-10-CM | POA: Diagnosis not present

## 2024-08-02 DIAGNOSIS — I10 Essential (primary) hypertension: Secondary | ICD-10-CM | POA: Diagnosis not present

## 2024-08-06 DIAGNOSIS — R350 Frequency of micturition: Secondary | ICD-10-CM | POA: Diagnosis not present

## 2024-08-06 DIAGNOSIS — R9721 Rising PSA following treatment for malignant neoplasm of prostate: Secondary | ICD-10-CM | POA: Diagnosis not present

## 2024-08-06 DIAGNOSIS — R3915 Urgency of urination: Secondary | ICD-10-CM | POA: Diagnosis not present

## 2024-08-06 DIAGNOSIS — N401 Enlarged prostate with lower urinary tract symptoms: Secondary | ICD-10-CM | POA: Diagnosis not present

## 2024-08-06 DIAGNOSIS — C61 Malignant neoplasm of prostate: Secondary | ICD-10-CM | POA: Diagnosis not present

## 2024-08-07 DIAGNOSIS — M549 Dorsalgia, unspecified: Secondary | ICD-10-CM | POA: Diagnosis not present

## 2024-08-14 DIAGNOSIS — M549 Dorsalgia, unspecified: Secondary | ICD-10-CM | POA: Diagnosis not present

## 2024-08-22 DIAGNOSIS — M549 Dorsalgia, unspecified: Secondary | ICD-10-CM | POA: Diagnosis not present

## 2024-08-30 DIAGNOSIS — M549 Dorsalgia, unspecified: Secondary | ICD-10-CM | POA: Diagnosis not present

## 2024-09-04 ENCOUNTER — Ambulatory Visit
Admission: EM | Admit: 2024-09-04 | Discharge: 2024-09-04 | Disposition: A | Discharge status: Home / Self Care | Attending: Family Medicine | Admitting: Family Medicine

## 2024-09-04 DIAGNOSIS — R10A1 Flank pain, right side: Secondary | ICD-10-CM | POA: Diagnosis not present

## 2024-09-04 LAB — POCT URINE DIPSTICK
Bilirubin, UA: NEGATIVE
Blood, UA: NEGATIVE
Glucose, UA: NEGATIVE mg/dL
Ketones, POC UA: NEGATIVE mg/dL
Leukocytes, UA: NEGATIVE
Nitrite, UA: NEGATIVE
POC PROTEIN,UA: NEGATIVE
Spec Grav, UA: <=1.005 — AB (ref 1.010–1.025)
Urobilinogen, UA: 0.2 U/dL
pH, UA: 5.5 (ref 5.0–8.0)

## 2024-09-04 MED ORDER — IBUPROFEN 800 MG PO TABS
800.0000 mg | ORAL_TABLET | Freq: Once | ORAL | Status: AC
  Administered 2024-09-04: 800 mg via ORAL

## 2024-09-04 NOTE — ED Triage Notes (Signed)
 Pt present back and side pain, symptom started two days ago. Pt states the pain starts in his back and move in front.

## 2024-09-04 NOTE — ED Provider Notes (Signed)
 [facility]    CSN: [id number] Arrival date & time: 09/04/24  1228      History   Chief Complaint Chief Complaint  Patient presents with   Back Pain    HPI Cameron Atkins is a 78 y.o. male.   Patient presenting today with new onset right sided mid back pain radiating to the right flank and around just under the right rib region that started yesterday.  Denies worsening pain with movement, deep breathing, associated discoloration or rashes, nausea vomiting diarrhea or constipation, fevers, new foods or medications tried, injury to the area.  So far not trying anything over-the-counter for symptoms.  Past medical history significant for diverticulosis, GERD, history of hiatal hernia, history of H. pylori, history of bladder stone, history of ureteral calculus.    Past Medical History:  Diagnosis Date   Anxiety    Arthritis    Bladder stone    Depression    Diverticulosis of colon    GERD (gastroesophageal reflux disease)    H/O hiatal hernia    Helicobacter pylori gastritis    History of colonoscopy with polypectomy    TUBULAR ADENOMA-- 2012   History of esophageal dilatation    STRICTURE--  LAST DONE 03/2013   Hypertension    for 5 yrs   IDA (iron deficiency anemia) 11/22/2018   Left ureteral calculus    Osteoporosis    Restless legs syndrome    Wears glasses     Patient Active Problem List   Diagnosis Date Noted   Generalized weakness 11/26/2023   Influenza A 11/26/2023   RLS (restless legs syndrome) 11/26/2023   Peripheral neuropathy 11/26/2023   Essential hypertension 11/26/2023   Hyponatremia 11/25/2023   Genetic testing 02/19/2022   Malignant neoplasm of prostate (HCC) 02/02/2022   Abdominal pain, left lower quadrant 10/15/2021   Benzodiazepine dependence (HCC) 10/18/2018   High risk medication use 10/18/2018   Esophageal stricture 08/04/2016   History of colonic polyps 08/04/2016   GERD (gastroesophageal reflux disease) 06/26/2013    Osteoporosis 06/26/2013   Anxiety 06/26/2013    Past Surgical History:  Procedure Laterality Date   BIOPSY  09/10/2016   Procedure: BIOPSY;  Surgeon: Claudis RAYMOND Rivet, MD;  [facility];  Service: Endoscopy;;  rectal polyp   COLONOSCOPY WITH PROPOFOL  N/A 09/10/2016   Procedure: COLONOSCOPY WITH PROPOFOL ;  Surgeon: Claudis RAYMOND Rivet, MD;  [facility];  Service: Endoscopy;  Laterality: N/A;   COLONOSCOPY WITH PROPOFOL  N/A 11/25/2021   Procedure: COLONOSCOPY WITH PROPOFOL ;  Surgeon: Rivet Claudis RAYMOND, MD;  [facility];  Service: Endoscopy;  Laterality: N/A;  1:15  ASA 1   CYSTO/  URETEROSCOPIC LITHOTRIPSY/ STONE EXTRACTION  1995   CYSTOSCOPY W/ RETROGRADES Right 01/15/2014   Procedure: CYSTOSCOPY WITH RIGHT RETROGRADE PYELOGRAM;  Surgeon: Norleen Seltzer, MD;  Location: [facility];  Service: Urology;  Laterality: Right;   CYSTOSCOPY WITH RETROGRADE PYELOGRAM, URETEROSCOPY AND STENT PLACEMENT Left 01/15/2014   Procedure: CYSTOSCOPY , LEFT RETROGRADE PYELOGRAM, LEFT URETEROSCOPY, BASKET STONE EXTRACTION;  Surgeon: Norleen Seltzer, MD;  Location: [facility];  Service: Urology;  Laterality: Left;   ESOPHAGEAL DILATION  03/21/2013   Procedure: ESOPHAGEAL DILATION;  Surgeon: Margo LITTIE Haddock, MD;  [facility];  Service: Endoscopy;;   ESOPHAGEAL DILATION N/A 09/10/2016   Procedure: ESOPHAGEAL DILATION;  Surgeon: Claudis RAYMOND Rivet, MD;  [facility];  Service: Endoscopy;  Laterality: N/A;   ESOPHAGOGASTRODUODENOSCOPY N/A 03/21/2013  Procedure: ESOPHAGOGASTRODUODENOSCOPY (EGD);  Surgeon: Margo LITTIE Haddock, MD;  [facility];  Service: Endoscopy;  Laterality: N/A;   ESOPHAGOGASTRODUODENOSCOPY (EGD) WITH ESOPHAGEAL DILATION N/A 04/26/2013   Procedure: ESOPHAGOGASTRODUODENOSCOPY (EGD) WITH ESOPHAGEAL DILATION;  Surgeon: Claudis RAYMOND Rivet, MD;  [facility];  Service: Endoscopy;  Laterality: N/A;  130    ESOPHAGOGASTRODUODENOSCOPY (EGD) WITH PROPOFOL  N/A 09/10/2016   Procedure: ESOPHAGOGASTRODUODENOSCOPY (EGD) WITH PROPOFOL ;  Surgeon: Claudis RAYMOND Rivet, MD;  [facility];  Service: Endoscopy;  Laterality: N/A;  10:20   EXTRACORPOREAL SHOCK WAVE LITHOTRIPSY  X2   YRS AGO   GOLD SEED IMPLANT N/A 04/06/2022   Procedure: GOLD SEED IMPLANT;  Surgeon: Devere Lonni Righter, MD;  Location: [facility];  Service: Urology;  Laterality: N/A;   HOLMIUM LASER APPLICATION Left 01/15/2014   Procedure: HOLMIUM LASER APPLICATION, LEFT URETER;  Surgeon: Norleen Seltzer, MD;  Location: [facility];  Service: Urology;  Laterality: Left;   POLYPECTOMY  11/25/2021   Procedure: POLYPECTOMY INTESTINAL;  Surgeon: Rivet Claudis RAYMOND, MD;  [facility];  Service: Endoscopy;;   SHOULDER ARTHROSCOPY W/ SUBACROMIAL DECOMPRESSION AND DISTAL CLAVICLE EXCISION Left 2000   SPACE OAR INSTILLATION N/A 04/06/2022   Procedure: SPACE OAR INSTILLATION;  Surgeon: Devere Lonni Righter, MD;  Location: [facility];  Service: Urology;  Laterality: N/A;       Home Medications    Prior to Admission medications   Medication Sig Start Date End Date Taking? Authorizing Provider  albuterol  (VENTOLIN  HFA) 108 (90 Base) MCG/ACT inhaler Inhale 1-2 puffs into the lungs every 6 (six) hours as needed for wheezing or shortness of breath.    [provider]  B Complex-C-Folic Acid (B-COMPLEX BALANCED PO) Take 1 tablet by mouth daily.    [provider]  cimetidine  (TAGAMET ) 400 MG tablet Take 1 tablet (400 mg total) by mouth daily as needed. 06/26/13   Rehman, Claudis RAYMOND, MD  fish oil-omega-3 fatty acids 1000 MG capsule Take 2 g by mouth daily.    [provider]  furosemide (LASIX) 20 MG tablet Take 10 mg by mouth daily.    [provider]  gabapentin  (NEURONTIN ) 600 MG tablet Take 1 tablet (600 mg total) by mouth in the morning and at bedtime. 11/28/23    Johnson, Clanford L, MD  hydrOXYzine  (VISTARIL ) 25 MG capsule Take 25 mg by mouth daily as needed for itching. 10/27/21   [provider]  Iron-FA-B Cmp-C-Biot-Probiotic (FUSION PLUS) CAPS Take 1 capsule by mouth daily. 09/22/21   [provider]  lisinopril  (ZESTRIL ) 40 MG tablet Take 40 mg by mouth daily. 08/14/20   [provider]  LORazepam  (ATIVAN ) 1 MG tablet Take 1 tablet (1 mg total) by mouth every 8 (eight) hours as needed for anxiety. 11/28/23   Vicci Afton LITTIE, MD  Multiple Vitamin (MULTIVITAMIN) tablet Take 1 tablet by mouth daily.    [provider]  pantoprazole  (PROTONIX ) 40 MG tablet TAKE 1 TABLET BY MOUTH EVERY MORNING Patient taking differently: Take 40 mg by mouth every morning. 12/04/18   Setzer, Terri L, NP  psyllium (HYDROCIL/METAMUCIL) 95 % PACK Take 1 packet by mouth daily.    [provider]  rOPINIRole  (REQUIP ) 0.25 MG tablet Take 5-6 tablets (1.25-1.5 mg total) by mouth See admin instructions. Take 5 tablets in the morning and 5 tablets in the afternoon, then take 6 tablets in the evening for restless legs (May dissolve tablets and take doses as  needed) 11/28/23   Vicci Afton CROME, MD    Family History Family History  Problem Relation Age of Onset   Anxiety disorder Father    Breast cancer Sister 40   Breast cancer Maternal Aunt    Breast cancer Maternal Aunt    Breast cancer Maternal Aunt    Breast cancer Paternal Grandmother    Anxiety disorder Daughter    Anxiety disorder Son     Social History Social History   Tobacco Use   Smoking status: Former    Current packs/day: 0.00    Average packs/day: 0.3 packs/day for 2.0 years (0.5 ttl pk-yrs)    Types: Cigarettes    Start date: 01/15/1967    Quit date: 01/14/1969    Years since quitting: 55.6   Smokeless tobacco: Former    Types: Chew    Quit date: 01/15/1999  Vaping Use   Vaping status: Never Used  Substance Use Topics   Alcohol use: Not Currently   Drug  use: No     Allergies   Benadryl  [diphenhydramine ], Cat dander, Hctz [hydrochlorothiazide ], Mepergan [meperidine -promethazine ], Other, Oxycodone , Amoxicillin, Flagyl  [metronidazole ], and Singulair [montelukast sodium]   Review of Systems Review of Systems Per HPI  Physical Exam Triage Vital Signs ED Triage Vitals  Encounter Vitals Group     BP 09/04/24 1247 130/80     Girls Systolic BP Percentile --      Girls Diastolic BP Percentile --      Boys Systolic BP Percentile --      Boys Diastolic BP Percentile --      Pulse Rate 09/04/24 1247 78     Resp 09/04/24 1247 18     Temp 09/04/24 1247 98 F (36.7 C)     Temp Source 09/04/24 1247 Oral     SpO2 09/04/24 1247 90 %     Weight --      Height --      Head Circumference --      Peak Flow --      Pain Score 09/04/24 1246 6     Pain Loc --      Pain Education --      Exclude from Growth Chart --    No data found.  Updated Vital Signs BP 130/80 (BP Location: Left Arm)   Pulse 78   Temp 98 F (36.7 C) (Oral)   Resp 18   SpO2 90%   Visual Acuity Right Eye Distance:   Left Eye Distance:   Bilateral Distance:    Right Eye Near:   Left Eye Near:    Bilateral Near:     Physical Exam Vitals and nursing note reviewed.  Constitutional:      Appearance: Normal appearance.  HENT:     Head: Atraumatic.     Mouth/Throat:     Mouth: Mucous membranes are moist.  Eyes:     Extraocular Movements: Extraocular movements intact.     Pupils: Pupils are equal, round, and reactive to light.  Cardiovascular:     Rate and Rhythm: Normal rate.  Pulmonary:     Effort: Pulmonary effort is normal.     Breath sounds: Normal breath sounds. No wheezing or rales.  Abdominal:     General: Bowel sounds are normal. There is no distension.     Palpations: Abdomen is soft.     Tenderness: There is no abdominal tenderness. There is no right CVA tenderness, left CVA tenderness or guarding.  Musculoskeletal:  General: Normal range  of motion.     Cervical back: Normal range of motion and neck supple.  Skin:    General: Skin is warm and dry.     Findings: No erythema or rash.  Neurological:     Mental Status: He is oriented to person, place, and time.     Comments: Bilateral lower extremities neurovascularly intact  Psychiatric:        Mood and Affect: Mood normal.        Thought Content: Thought content normal.        Judgment: Judgment normal.      UC Treatments / Results  Labs (all labs ordered are listed, but only abnormal results are displayed) Labs Reviewed  POCT URINE DIPSTICK - Abnormal; Notable for the following components:      Result Value   Clarity, UA cloudy (*)    Spec Grav, UA <=1.005 (*)    All other components within normal limits  CBC WITH DIFFERENTIAL/PLATELET  COMPREHENSIVE METABOLIC PANEL WITH GFR  LIPASE    EKG   Radiology No results found.  Procedures Procedures (including critical care time)  Medications Ordered in UC Medications  ibuprofen  (ADVIL ) tablet 800 mg (800 mg Oral Given 09/04/24 1338)    Initial Impression / Assessment and Plan / UC Course  I have reviewed the triage vital signs and the nursing notes.  Pertinent labs & imaging results that were available during my care of the patient were reviewed by me and considered in my medical decision making (see chart for details).     Urinalysis today without significant abnormalities, though did discuss with patient that this does not officially rule out a kidney stone as a possible cause of pain.  Labs pending for further evaluation, vitals and exam reassuring with no red flag findings.  Unclear etiology of pain at this time, discussed ibuprofen  which was given in triage, heat, massage, fluids and close PCP follow-up.  ED for worsening symptoms.  Final Clinical Impressions(s) / UC Diagnoses   Final diagnoses:  Right flank pain     Discharge Instructions      It is unclear exactly what is causing her right  sided flank pain at this time.  Your vital signs and exam are very reassuring.  Your urine test did not show evidence of a kidney stone or kidney/urinary tract irritation.  We have obtained some labs that should be back in the next day or so and we will let you know if anything comes back abnormal.  In the meantime, schedule an appointment to follow-up with your primary care provider soon as possible and you may try over-the-counter pain relievers, heat, massage, bland foods.  Go to the emergency department for severe worsening symptoms at any time.    ED Prescriptions   None    PDMP not reviewed this encounter.   Stuart Vernell Norris, PA-C 09/04/24 1656

## 2024-09-04 NOTE — Discharge Instructions (Signed)
 It is unclear exactly what is causing her right sided flank pain at this time.  Your vital signs and exam are very reassuring.  Your urine test did not show evidence of a kidney stone or kidney/urinary tract irritation.  We have obtained some labs that should be back in the next day or so and we will let you know if anything comes back abnormal.  In the meantime, schedule an appointment to follow-up with your primary care provider soon as possible and you may try over-the-counter pain relievers, heat, massage, bland foods.  Go to the emergency department for severe worsening symptoms at any time.

## 2024-09-05 ENCOUNTER — Ambulatory Visit (HOSPITAL_COMMUNITY): Payer: Self-pay

## 2024-09-05 LAB — CBC WITH DIFFERENTIAL/PLATELET
Basophils Absolute: 0 x10E3/uL (ref 0.0–0.2)
Basos: 0 %
EOS (ABSOLUTE): 0.4 x10E3/uL (ref 0.0–0.4)
Eos: 6 %
Hematocrit: 36.8 % — ABNORMAL LOW (ref 37.5–51.0)
Hemoglobin: 12.4 g/dL — ABNORMAL LOW (ref 13.0–17.7)
Immature Grans (Abs): 0.1 x10E3/uL (ref 0.0–0.1)
Immature Granulocytes: 2 %
Lymphocytes Absolute: 1.9 x10E3/uL (ref 0.7–3.1)
Lymphs: 24 %
MCH: 30.8 pg (ref 26.6–33.0)
MCHC: 33.7 g/dL (ref 31.5–35.7)
MCV: 91 fL (ref 79–97)
Monocytes Absolute: 0.9 x10E3/uL (ref 0.1–0.9)
Monocytes: 11 %
Neutrophils Absolute: 4.4 x10E3/uL (ref 1.4–7.0)
Neutrophils: 57 %
Platelets: 326 x10E3/uL (ref 150–450)
RBC: 4.03 x10E6/uL — ABNORMAL LOW (ref 4.14–5.80)
RDW: 12.5 % (ref 11.6–15.4)
WBC: 7.7 x10E3/uL (ref 3.4–10.8)

## 2024-09-05 LAB — COMPREHENSIVE METABOLIC PANEL WITH GFR
ALT: 14 IU/L (ref 0–44)
AST: 23 IU/L (ref 0–40)
Albumin: 4.2 g/dL (ref 3.8–4.8)
Alkaline Phosphatase: 57 IU/L (ref 47–123)
BUN/Creatinine Ratio: 17 (ref 10–24)
BUN: 25 mg/dL (ref 8–27)
Bilirubin Total: 0.4 mg/dL (ref 0.0–1.2)
CO2: 19 mmol/L — ABNORMAL LOW (ref 20–29)
Calcium: 9.6 mg/dL (ref 8.6–10.2)
Chloride: 103 mmol/L (ref 96–106)
Creatinine, Ser: 1.44 mg/dL — ABNORMAL HIGH (ref 0.76–1.27)
Globulin, Total: 2.3 g/dL (ref 1.5–4.5)
Glucose: 99 mg/dL (ref 70–99)
Potassium: 4.7 mmol/L (ref 3.5–5.2)
Sodium: 138 mmol/L (ref 134–144)
Total Protein: 6.5 g/dL (ref 6.0–8.5)
eGFR: 49 mL/min/1.73 — ABNORMAL LOW (ref >59)

## 2024-09-05 LAB — LIPASE: Lipase: 33 U/L (ref 13–78)

## 2024-09-06 DIAGNOSIS — N209 Urinary calculus, unspecified: Secondary | ICD-10-CM | POA: Diagnosis not present

## 2024-09-10 DIAGNOSIS — E875 Hyperkalemia: Secondary | ICD-10-CM | POA: Diagnosis not present

## 2024-09-10 DIAGNOSIS — E871 Hypo-osmolality and hyponatremia: Secondary | ICD-10-CM | POA: Diagnosis not present

## 2024-09-10 DIAGNOSIS — F419 Anxiety disorder, unspecified: Secondary | ICD-10-CM | POA: Diagnosis not present

## 2024-09-10 DIAGNOSIS — N200 Calculus of kidney: Secondary | ICD-10-CM | POA: Diagnosis not present

## 2024-09-10 DIAGNOSIS — M170 Bilateral primary osteoarthritis of knee: Secondary | ICD-10-CM | POA: Diagnosis not present

## 2024-09-10 DIAGNOSIS — D509 Iron deficiency anemia, unspecified: Secondary | ICD-10-CM | POA: Diagnosis not present

## 2024-09-10 DIAGNOSIS — R10A1 Flank pain, right side: Secondary | ICD-10-CM | POA: Diagnosis not present

## 2024-09-10 DIAGNOSIS — M810 Age-related osteoporosis without current pathological fracture: Secondary | ICD-10-CM | POA: Diagnosis not present

## 2024-09-10 DIAGNOSIS — I10 Essential (primary) hypertension: Secondary | ICD-10-CM | POA: Diagnosis not present

## 2024-09-10 DIAGNOSIS — R944 Abnormal results of kidney function studies: Secondary | ICD-10-CM | POA: Diagnosis not present

## 2024-09-14 ENCOUNTER — Ambulatory Visit (INDEPENDENT_AMBULATORY_CARE_PROVIDER_SITE_OTHER)

## 2024-09-14 VITALS — BP 150/86 | HR 88 | Ht 65.0 in | Wt 172.0 lb

## 2024-09-14 DIAGNOSIS — R7303 Prediabetes: Secondary | ICD-10-CM

## 2024-09-14 DIAGNOSIS — M545 Low back pain, unspecified: Secondary | ICD-10-CM | POA: Diagnosis not present

## 2024-09-14 DIAGNOSIS — N289 Disorder of kidney and ureter, unspecified: Secondary | ICD-10-CM

## 2024-09-14 DIAGNOSIS — G8929 Other chronic pain: Secondary | ICD-10-CM

## 2024-09-14 DIAGNOSIS — I10 Essential (primary) hypertension: Secondary | ICD-10-CM

## 2024-09-14 NOTE — Progress Notes (Unsigned)
 "  New Patient Office Visit  Subjective    Patient ID: Cameron Atkins, male    DOB: 01/04/1946  Age: 78 y.o. MRN: [id number]  CC:  Chief Complaint  Patient presents with   Establish Care    Pt here to establish care, has concerns of pain in his back that radiates around to his side and under his ribs, went to UC and got Labs and went to Dr.Hall and Dr.Rhen and they did xrays and ultra sounds and they didn't find anything but one kidney stone that was to big to pass and one that was small enough to pass but its still in his kidney and had him do a CT scan yesterday and hasn't heard anything from that    HPI Cameron Atkins presents to establish care Patient is previous patient of Dr. Norleen Hurst, here in [place].    Outpatient Encounter Medications as of 09/14/2024  Medication Sig   albuterol  (VENTOLIN  HFA) 108 (90 Base) MCG/ACT inhaler Inhale 1-2 puffs into the lungs every 6 (six) hours as needed for wheezing or shortness of breath.   B Complex-C-Folic Acid (B-COMPLEX BALANCED PO) Take 1 tablet by mouth daily.   cimetidine  (TAGAMET ) 400 MG tablet Take 1 tablet (400 mg total) by mouth daily as needed.   fish oil-omega-3 fatty acids 1000 MG capsule Take 2 g by mouth daily.   furosemide (LASIX) 20 MG tablet Take 10 mg by mouth daily.   gabapentin  (NEURONTIN ) 600 MG tablet Take 1 tablet (600 mg total) by mouth in the morning and at bedtime.   hydrOXYzine  (VISTARIL ) 25 MG capsule Take 25 mg by mouth daily as needed for itching.   Iron-FA-B Cmp-C-Biot-Probiotic (FUSION PLUS) CAPS Take 1 capsule by mouth daily.   lisinopril  (ZESTRIL ) 40 MG tablet Take 40 mg by mouth daily.   LORazepam  (ATIVAN ) 1 MG tablet Take 1 tablet (1 mg total) by mouth every 8 (eight) hours as needed for anxiety.   Multiple Vitamin (MULTIVITAMIN) tablet Take 1 tablet by mouth daily.   pantoprazole  (PROTONIX ) 40 MG tablet TAKE 1 TABLET BY MOUTH EVERY MORNING (Patient taking differently: Take 40 mg by mouth every morning.)    psyllium (HYDROCIL/METAMUCIL) 95 % PACK Take 1 packet by mouth daily.   rOPINIRole  (REQUIP ) 0.25 MG tablet Take 5-6 tablets (1.25-1.5 mg total) by mouth See admin instructions. Take 5 tablets in the morning and 5 tablets in the afternoon, then take 6 tablets in the evening for restless legs (May dissolve tablets and take doses as needed)   Facility-Administered Encounter Medications as of 09/14/2024  Medication   levofloxacin  (LEVAQUIN ) IVPB 500 mg    Past Medical History:  Diagnosis Date   Anxiety    Arthritis    Bladder stone    Depression    Diverticulosis of colon    GERD (gastroesophageal reflux disease)    H/O hiatal hernia    Helicobacter pylori gastritis    History of colonoscopy with polypectomy    TUBULAR ADENOMA-- 2012   History of esophageal dilatation    STRICTURE--  LAST DONE 03/2013   Hypertension    for 5 yrs   IDA (iron deficiency anemia) 11/22/2018   Left ureteral calculus    Osteoporosis    Restless legs syndrome    Wears glasses     Past Surgical History:  Procedure Laterality Date   BIOPSY  09/10/2016   Procedure: BIOPSY;  Surgeon: Claudis RAYMOND Rivet, MD;  [facility];  Service: Endoscopy;;  rectal polyp   COLONOSCOPY WITH PROPOFOL  N/A 09/10/2016   Procedure: COLONOSCOPY WITH PROPOFOL ;  Surgeon: Claudis RAYMOND Rivet, MD;  [facility];  Service: Endoscopy;  Laterality: N/A;   COLONOSCOPY WITH PROPOFOL  N/A 11/25/2021   Procedure: COLONOSCOPY WITH PROPOFOL ;  Surgeon: Rivet Claudis RAYMOND, MD;  [facility];  Service: Endoscopy;  Laterality: N/A;  1:15  ASA 1   CYSTO/  URETEROSCOPIC LITHOTRIPSY/ STONE EXTRACTION  1995   CYSTOSCOPY W/ RETROGRADES Right 01/15/2014   Procedure: CYSTOSCOPY WITH RIGHT RETROGRADE PYELOGRAM;  Surgeon: Norleen Seltzer, MD;  Location[facility];  Service: Urology;  Laterality: Right;   CYSTOSCOPY WITH RETROGRADE PYELOGRAM, URETEROSCOPY AND STENT PLACEMENT Left 01/15/2014   Procedure: CYSTOSCOPY , LEFT  RETROGRADE PYELOGRAM, LEFT URETEROSCOPY, BASKET STONE EXTRACTION;  Surgeon: Norleen Seltzer, MD;  Location: [facility];  Service: Urology;  Laterality: Left;   ESOPHAGEAL DILATION  03/21/2013   Procedure: ESOPHAGEAL DILATION;  Surgeon: Margo LITTIE Haddock, MD;  [facility];  Service: Endoscopy;;   ESOPHAGEAL DILATION N/A 09/10/2016   Procedure: ESOPHAGEAL DILATION;  Surgeon: Claudis RAYMOND Rivet, MD;  [facility];  Service: Endoscopy;  Laterality: N/A;   ESOPHAGOGASTRODUODENOSCOPY N/A 03/21/2013   Procedure: ESOPHAGOGASTRODUODENOSCOPY (EGD);  Surgeon: Margo LITTIE Haddock, MD;  [facility];  Service: Endoscopy;  Laterality: N/A;   ESOPHAGOGASTRODUODENOSCOPY (EGD) WITH ESOPHAGEAL DILATION N/A 04/26/2013   Procedure: ESOPHAGOGASTRODUODENOSCOPY (EGD) WITH ESOPHAGEAL DILATION;  Surgeon: Claudis RAYMOND Rivet, MD;  [facility];  Service: Endoscopy;  Laterality: N/A;  130   ESOPHAGOGASTRODUODENOSCOPY (EGD) WITH PROPOFOL  N/A 09/10/2016   Procedure: ESOPHAGOGASTRODUODENOSCOPY (EGD) WITH PROPOFOL ;  Surgeon: Claudis RAYMOND Rivet, MD;  [facility];  Service: Endoscopy;  Laterality: N/A;  10:20   EXTRACORPOREAL SHOCK WAVE LITHOTRIPSY  X2   YRS AGO   GOLD SEED IMPLANT N/A 04/06/2022   Procedure: GOLD SEED IMPLANT;  Surgeon: Devere Lonni Righter, MD;  Location: [facility];  Service: Urology;  Laterality: N/A;   HOLMIUM LASER APPLICATION Left 01/15/2014   Procedure: HOLMIUM LASER APPLICATION, LEFT URETER;  Surgeon: Norleen Seltzer, MD;  Location: [facility];  Service: Urology;  Laterality: Left;   POLYPECTOMY  11/25/2021   Procedure: POLYPECTOMY INTESTINAL;  Surgeon: Rivet Claudis RAYMOND, MD;  [facility];  Service: Endoscopy;;   SHOULDER ARTHROSCOPY W/ SUBACROMIAL DECOMPRESSION AND DISTAL CLAVICLE EXCISION Left 2000   SPACE OAR INSTILLATION N/A 04/06/2022   Procedure: SPACE OAR INSTILLATION;  Surgeon: Devere Lonni Righter, MD;   Location: [facility];  Service: Urology;  Laterality: N/A;    Family History  Problem Relation Age of Onset   Anxiety disorder Father    Breast cancer Sister 70   Breast cancer Maternal Aunt    Breast cancer Maternal Aunt    Breast cancer Maternal Aunt    Breast cancer Paternal Grandmother    Anxiety disorder Daughter    Anxiety disorder Son     Social History   Socioeconomic History   Marital status: Married    Spouse name: Consuelo   Number of children: 3   Years of education: 13   Highest education level: Some college, no degree  Occupational History   Occupation: retired  Tobacco Use   Smoking status: Former    Current packs/day: 0.00    Average packs/day: 0.3 packs/day for 2.0 years (0.5 ttl pk-yrs)    Types: Cigarettes    Start date: 01/15/1967    Quit date:  01/14/1969    Years since quitting: 55.7   Smokeless tobacco: Former    Types: Chew    Quit date: 01/15/1999  Vaping Use   Vaping status: Never Used  Substance and Sexual Activity   Alcohol use: Not Currently   Drug use: No   Sexual activity: Not Currently    Birth control/protection: None  Other Topics Concern   Not on file  Social History Narrative   Right handed   Lives with family    Social Drivers of Health   Tobacco Use: Medium Risk (09/14/2024)   Patient History    Smoking Tobacco Use: Former    Smokeless Tobacco Use: Former    Passive Exposure: Not on Actuary Strain: Not on file  Food Insecurity: No Food Insecurity (11/29/2023)   Hunger Vital Sign    Worried About Running Out of Food in the Last Year: Never true    Ran Out of Food in the Last Year: Never true  Transportation Needs: No Transportation Needs (11/29/2023)   PRAPARE - Administrator, Civil Service (Medical): No    Lack of Transportation (Non-Medical): No  Physical Activity: Not on file  Stress: Not on file  Social Connections: Moderately Isolated (11/26/2023)   Social Connection and  Isolation Panel    Frequency of Communication with Friends and Family: Three times a week    Frequency of Social Gatherings with Friends and Family: Once a week    Attends Religious Services: Never    Database Administrator or Organizations: No    Attends Banker Meetings: Never    Marital Status: Married  Catering Manager Violence: Not At Risk (11/29/2023)   Humiliation, Afraid, Rape, and Kick questionnaire    Fear of Current or Ex-Partner: No    Emotionally Abused: No    Physically Abused: No    Sexually Abused: No  Depression (PHQ2-9): Low Risk (05/24/2024)   Depression (PHQ2-9)    PHQ-2 Score: 0  Alcohol Screen: Not on file  Housing: Low Risk (11/29/2023)   Housing Stability Vital Sign    Unable to Pay for Housing in the Last Year: No    Number of Times Moved in the Last Year: 0    Homeless in the Last Year: No  Utilities: Not At Risk (11/29/2023)   [facility] Utilities    Threatened with loss of utilities: No  Health Literacy: Not on file    ROS      Objective    BP (!) 160/93   Pulse 88   Ht 5' 5 (1.651 m)   Wt 172 lb 0.6 oz (78 kg)   SpO2 93%   BMI 28.63 kg/m   Physical Exam  {Labs (Optional):23779}    Assessment & Plan:   Problem List Items Addressed This Visit   None   No follow-ups on file.   Leita Longs, FNP   "

## 2024-09-15 LAB — BASIC METABOLIC PANEL WITH GFR
BUN/Creatinine Ratio: 20 (ref 10–24)
BUN: 22 mg/dL (ref 8–27)
CO2: 21 mmol/L (ref 20–29)
Calcium: 9.5 mg/dL (ref 8.6–10.2)
Chloride: 101 mmol/L (ref 96–106)
Creatinine, Ser: 1.12 mg/dL (ref 0.76–1.27)
Glucose: 86 mg/dL (ref 70–99)
Potassium: 4.7 mmol/L (ref 3.5–5.2)
Sodium: 139 mmol/L (ref 134–144)
eGFR: 67 mL/min/1.73

## 2024-09-15 LAB — HEMOGLOBIN A1C
Est. average glucose Bld gHb Est-mCnc: 128 mg/dL
Hgb A1c MFr Bld: 6.1 % — ABNORMAL HIGH (ref 4.8–5.6)

## 2024-09-16 ENCOUNTER — Ambulatory Visit: Payer: Self-pay

## 2024-09-16 DIAGNOSIS — N289 Disorder of kidney and ureter, unspecified: Secondary | ICD-10-CM | POA: Insufficient documentation

## 2024-09-16 NOTE — Assessment & Plan Note (Addendum)
 Blood pressure 150/86 mmHg, improved from 165/96 mmHg.  Managed with lisinopril  40 mg.  No antihypertensive taken today.

## 2024-09-16 NOTE — Assessment & Plan Note (Signed)
 Recent labs showed elevated kidney function markers. Awaiting CT scan for further assessment. - Rechecked kidney function.

## 2024-09-16 NOTE — Assessment & Plan Note (Signed)
 Exacerbated by recent activity, possibly musculoskeletal. No rash, ruling out shingles. Possible relation to previous kidney stones or strain. - Await CT scan results for evaluation.

## 2024-09-16 NOTE — Assessment & Plan Note (Signed)
Recheck fasting labs.  

## 2024-09-18 ENCOUNTER — Telehealth: Payer: Self-pay

## 2024-09-18 NOTE — Telephone Encounter (Signed)
 error

## 2024-09-18 NOTE — Telephone Encounter (Signed)
 Patient came by office and gotten ct scan results back said was normal. But still have lower right side more towards front.  Also patient came by to see about his blood work results. He does not know much about mychart messages. His # [phone] or no answer Cameron Atkins wife 336. [phone].

## 2024-09-19 ENCOUNTER — Telehealth: Payer: Self-pay

## 2024-09-19 NOTE — Telephone Encounter (Signed)
 Called pt to make him aware of his labs  His labs showed increased blood sugar levels in the prediabetic range. Recommend working on a healthy, low-carbohydrate diet and regular exercise to improve blood sugar levels. Recommend rechecking labs in 6 months.

## 2024-09-19 NOTE — Telephone Encounter (Signed)
 Called pt left voicemail   reason for call documented in pts chart

## 2024-09-24 ENCOUNTER — Other Ambulatory Visit: Payer: Self-pay

## 2024-09-24 DIAGNOSIS — Z8546 Personal history of malignant neoplasm of prostate: Secondary | ICD-10-CM

## 2024-09-28 ENCOUNTER — Ambulatory Visit: Payer: Self-pay

## 2024-09-28 VITALS — BP 151/87 | HR 86 | Ht 65.0 in | Wt 168.1 lb

## 2024-09-28 DIAGNOSIS — R7303 Prediabetes: Secondary | ICD-10-CM | POA: Diagnosis not present

## 2024-09-28 DIAGNOSIS — Z91014 Allergy to mammalian meats: Secondary | ICD-10-CM | POA: Diagnosis not present

## 2024-09-28 DIAGNOSIS — R1032 Left lower quadrant pain: Secondary | ICD-10-CM

## 2024-09-28 NOTE — Progress Notes (Signed)
 "  Established Patient Office Visit  Subjective   Patient ID: HILBERT BRIGGS, male    DOB: 01-Oct-1945  Age: 79 y.o. MRN: [id number]  Chief Complaint  Patient presents with   Medical Management of Chronic Issues    Pt here to discuss pain in the lower right quadrant of stomach and states it started about 2 weeks ago pt states he feels it in his back but has eased up, pt also has concerns of Urinary Urgency goes to the bathroom about every hour    HPI  Discussed the use of AI scribe software for clinical note transcription with the patient, who gave verbal consent to proceed.  History of Present Illness    DIONTE BLAUSTEIN is a 79 year old male who presents with abdominal pain and urinary issues.  Abdominal pain - Abdominal pain persists but has improved over the last couple of days - Pain is located on the side and sometimes wraps around the abdomen - Onset was mild and began after consuming honey, though relationship is uncertain - No constipation - Able to continue working despite discomfort  Dietary management and glycemic control - Avoiding sugar and bread, with occasional consumption of bananas, oranges, and a half biscuit - Previous episode of elevated blood sugar - Managing diet by substituting eggs for sandwiches - Most recent A1c was 6.1 two weeks ago  Alpha-gal syndrome - History of alpha-gal syndrome - Inquires about testing for different levels of sensitivity      Patient Active Problem List   Diagnosis Date Noted   Alpha-gal syndrome 09/28/2024   Renal insufficiency 09/16/2024   Chronic low back pain 08/02/2024   Proteinuria 08/02/2024   Generalized weakness 11/26/2023   Influenza A 11/26/2023   RLS (restless legs syndrome) 11/26/2023   Peripheral neuropathy 11/26/2023   Essential hypertension 11/26/2023   Hyponatremia 11/25/2023   Benign prostatic hyperplasia 02/09/2023   Osteoarthritis of both knees 02/09/2023   Hardening of the aorta (main artery of the  heart) 09/01/2022   Generalized anxiety disorder 04/01/2022   Malignant neoplasm of prostate (HCC) 02/02/2022   Abdominal pain, left lower quadrant 10/15/2021   Diverticulosis of colon without diverticulitis 02/25/2021   Iron deficiency anemia 02/25/2021   Prediabetes 02/16/2021   Benzodiazepine dependence (HCC) 10/18/2018   High risk medication use 10/18/2018   Esophageal stricture 08/04/2016   History of colonic polyps 08/04/2016   GERD (gastroesophageal reflux disease) 06/26/2013   Osteoporosis 06/26/2013   Anxiety 06/26/2013    ROS    Objective:     BP (!) 151/87   Pulse 86   Ht 5' 5 (1.651 m)   Wt 168 lb 1.9 oz (76.3 kg)   SpO2 92%   BMI 27.98 kg/m  BP Readings from Last 3 Encounters:  09/28/24 (!) 151/87  09/14/24 (!) 150/86  09/04/24 130/80   Wt Readings from Last 3 Encounters:  09/28/24 168 lb 1.9 oz (76.3 kg)  09/14/24 172 lb 0.6 oz (78 kg)  02/23/24 173 lb 4.8 oz (78.6 kg)     Physical Exam Vitals and nursing note reviewed.  Constitutional:      Appearance: Normal appearance.  HENT:     Head: Normocephalic.     Right Ear: Tympanic membrane, ear canal and external ear normal.     Left Ear: Tympanic membrane, ear canal and external ear normal.     Nose: Nose normal.     Mouth/Throat:     Mouth: Mucous membranes are moist.  Pharynx: Oropharynx is clear.  Eyes:     Extraocular Movements: Extraocular movements intact.     Pupils: Pupils are equal, round, and reactive to light.  Cardiovascular:     Rate and Rhythm: Normal rate and regular rhythm.  Pulmonary:     Effort: Pulmonary effort is normal.     Breath sounds: Normal breath sounds.  Abdominal:     General: Abdomen is flat. Bowel sounds are normal.     Palpations: Abdomen is soft.  Musculoskeletal:        General: Normal range of motion.     Cervical back: Normal range of motion and neck supple.  Skin:    General: Skin is warm and dry.  Neurological:     Mental Status: He is alert and  oriented to person, place, and time.  Psychiatric:        Mood and Affect: Mood normal.        Thought Content: Thought content normal.      The 10-year ASCVD risk score (Arnett DK, et al., 2019) is: 40.5%    Assessment & Plan:   Problem List Items Addressed This Visit       Other   Abdominal pain, left lower quadrant   ntermittent abdominal pain with recent improvement. Differential includes kidney stones. No appendicitis or gallbladder issues.      Prediabetes   Recent A1c of 6.1 indicates prediabetes. Discussed dietary habits and sugar intake monitoring. - Recheck A1c in three months. - Continue dietary modifications to manage blood sugar levels.      Alpha-gal syndrome - Primary (Chronic)   Inquired about syndrome levels and treatments. No specific testing or treatment plan discussed.       No follow-ups on file.    Leita Longs, FNP  "

## 2024-09-30 NOTE — Assessment & Plan Note (Signed)
 ntermittent abdominal pain with recent improvement. Differential includes kidney stones. No appendicitis or gallbladder issues.

## 2024-09-30 NOTE — Assessment & Plan Note (Signed)
 Inquired about syndrome levels and treatments. No specific testing or treatment plan discussed.

## 2024-09-30 NOTE — Assessment & Plan Note (Signed)
 Recent A1c of 6.1 indicates prediabetes. Discussed dietary habits and sugar intake monitoring. - Recheck A1c in three months. - Continue dietary modifications to manage blood sugar levels.

## 2024-10-30 ENCOUNTER — Ambulatory Visit: Admission: RE | Admit: 2024-10-30 | Discharge: 2024-10-30 | Disposition: A | Source: Ambulatory Visit

## 2024-10-30 DIAGNOSIS — Z8546 Personal history of malignant neoplasm of prostate: Secondary | ICD-10-CM

## 2024-10-30 MED ORDER — GADOPICLENOL 0.5 MMOL/ML IV SOLN
8.0000 mL | Freq: Once | INTRAVENOUS | Status: AC | PRN
  Administered 2024-10-30: 8 mL via INTRAVENOUS

## 2025-01-18 ENCOUNTER — Ambulatory Visit: Payer: Self-pay

## 2025-05-24 ENCOUNTER — Ambulatory Visit
